# Patient Record
Sex: Female | Born: 1955 | Race: White | Hispanic: No | Marital: Single | Smoking: Never smoker
Health system: Southern US, Community
[De-identification: ages and names within clinical notes are randomized; demographics above are authoritative.]

## PROBLEM LIST (undated history)

## (undated) DIAGNOSIS — E559 Vitamin D deficiency, unspecified: Secondary | ICD-10-CM

## (undated) DIAGNOSIS — R51 Headache: Secondary | ICD-10-CM

## (undated) DIAGNOSIS — F71 Moderate intellectual disabilities: Secondary | ICD-10-CM

## (undated) DIAGNOSIS — F329 Major depressive disorder, single episode, unspecified: Secondary | ICD-10-CM

## (undated) DIAGNOSIS — S82209A Unspecified fracture of shaft of unspecified tibia, initial encounter for closed fracture: Secondary | ICD-10-CM

## (undated) DIAGNOSIS — I10 Essential (primary) hypertension: Secondary | ICD-10-CM

## (undated) DIAGNOSIS — K219 Gastro-esophageal reflux disease without esophagitis: Secondary | ICD-10-CM

## (undated) DIAGNOSIS — E669 Obesity, unspecified: Secondary | ICD-10-CM

## (undated) DIAGNOSIS — F32A Depression, unspecified: Secondary | ICD-10-CM

## (undated) DIAGNOSIS — A0472 Enterocolitis due to Clostridium difficile, not specified as recurrent: Secondary | ICD-10-CM

## (undated) DIAGNOSIS — N390 Urinary tract infection, site not specified: Secondary | ICD-10-CM

## (undated) DIAGNOSIS — R519 Headache, unspecified: Secondary | ICD-10-CM

## (undated) DIAGNOSIS — E785 Hyperlipidemia, unspecified: Secondary | ICD-10-CM

## (undated) DIAGNOSIS — F70 Mild intellectual disabilities: Secondary | ICD-10-CM

## (undated) DIAGNOSIS — F419 Anxiety disorder, unspecified: Secondary | ICD-10-CM

## (undated) DIAGNOSIS — C801 Malignant (primary) neoplasm, unspecified: Secondary | ICD-10-CM

## (undated) DIAGNOSIS — E119 Type 2 diabetes mellitus without complications: Secondary | ICD-10-CM

## (undated) HISTORY — PX: ABDOMINAL SURGERY: SHX537

## (undated) HISTORY — PX: COLON SURGERY: SHX602

## (undated) HISTORY — PX: DILATION AND CURETTAGE OF UTERUS: SHX78

## (undated) HISTORY — PX: FRACTURE SURGERY: SHX138

## (undated) HISTORY — PX: APPENDECTOMY: SHX54

## (undated) HISTORY — PX: BREAST SURGERY: SHX581

---

## 2011-12-24 DIAGNOSIS — E669 Obesity, unspecified: Secondary | ICD-10-CM | POA: Insufficient documentation

## 2012-09-10 DIAGNOSIS — S82202A Unspecified fracture of shaft of left tibia, initial encounter for closed fracture: Secondary | ICD-10-CM | POA: Insufficient documentation

## 2012-10-05 DIAGNOSIS — E1159 Type 2 diabetes mellitus with other circulatory complications: Secondary | ICD-10-CM | POA: Insufficient documentation

## 2012-10-05 DIAGNOSIS — F0390 Unspecified dementia without behavioral disturbance: Secondary | ICD-10-CM | POA: Insufficient documentation

## 2012-10-05 DIAGNOSIS — E785 Hyperlipidemia, unspecified: Secondary | ICD-10-CM | POA: Insufficient documentation

## 2012-10-05 DIAGNOSIS — E119 Type 2 diabetes mellitus without complications: Secondary | ICD-10-CM | POA: Insufficient documentation

## 2012-10-05 DIAGNOSIS — I152 Hypertension secondary to endocrine disorders: Secondary | ICD-10-CM | POA: Insufficient documentation

## 2012-10-05 DIAGNOSIS — N390 Urinary tract infection, site not specified: Secondary | ICD-10-CM | POA: Insufficient documentation

## 2012-10-05 DIAGNOSIS — E559 Vitamin D deficiency, unspecified: Secondary | ICD-10-CM | POA: Insufficient documentation

## 2012-10-05 DIAGNOSIS — E669 Obesity, unspecified: Secondary | ICD-10-CM | POA: Insufficient documentation

## 2012-10-05 DIAGNOSIS — S82109A Unspecified fracture of upper end of unspecified tibia, initial encounter for closed fracture: Secondary | ICD-10-CM | POA: Insufficient documentation

## 2012-10-05 DIAGNOSIS — R4189 Other symptoms and signs involving cognitive functions and awareness: Secondary | ICD-10-CM | POA: Insufficient documentation

## 2012-10-05 DIAGNOSIS — S72413A Displaced unspecified condyle fracture of lower end of unspecified femur, initial encounter for closed fracture: Secondary | ICD-10-CM | POA: Insufficient documentation

## 2012-10-26 DIAGNOSIS — K8020 Calculus of gallbladder without cholecystitis without obstruction: Secondary | ICD-10-CM | POA: Insufficient documentation

## 2012-10-30 DIAGNOSIS — IMO0001 Reserved for inherently not codable concepts without codable children: Secondary | ICD-10-CM | POA: Insufficient documentation

## 2013-04-18 DIAGNOSIS — C50919 Malignant neoplasm of unspecified site of unspecified female breast: Secondary | ICD-10-CM | POA: Insufficient documentation

## 2013-05-27 DIAGNOSIS — F411 Generalized anxiety disorder: Secondary | ICD-10-CM | POA: Diagnosis not present

## 2013-06-02 DIAGNOSIS — I498 Other specified cardiac arrhythmias: Secondary | ICD-10-CM | POA: Diagnosis not present

## 2013-06-02 DIAGNOSIS — Z79899 Other long term (current) drug therapy: Secondary | ICD-10-CM | POA: Diagnosis not present

## 2013-06-02 DIAGNOSIS — Z5181 Encounter for therapeutic drug level monitoring: Secondary | ICD-10-CM | POA: Diagnosis not present

## 2013-06-02 DIAGNOSIS — Z01818 Encounter for other preprocedural examination: Secondary | ICD-10-CM | POA: Diagnosis not present

## 2013-06-08 DIAGNOSIS — C50919 Malignant neoplasm of unspecified site of unspecified female breast: Secondary | ICD-10-CM | POA: Diagnosis not present

## 2013-06-09 DIAGNOSIS — C50919 Malignant neoplasm of unspecified site of unspecified female breast: Secondary | ICD-10-CM | POA: Diagnosis not present

## 2013-06-09 DIAGNOSIS — Z170 Estrogen receptor positive status [ER+]: Secondary | ICD-10-CM | POA: Diagnosis not present

## 2013-06-09 DIAGNOSIS — C50419 Malignant neoplasm of upper-outer quadrant of unspecified female breast: Secondary | ICD-10-CM | POA: Diagnosis not present

## 2013-06-09 DIAGNOSIS — L909 Atrophic disorder of skin, unspecified: Secondary | ICD-10-CM | POA: Diagnosis not present

## 2013-06-24 DIAGNOSIS — F411 Generalized anxiety disorder: Secondary | ICD-10-CM | POA: Diagnosis not present

## 2013-07-13 DIAGNOSIS — C50919 Malignant neoplasm of unspecified site of unspecified female breast: Secondary | ICD-10-CM | POA: Diagnosis not present

## 2013-07-16 DIAGNOSIS — F411 Generalized anxiety disorder: Secondary | ICD-10-CM | POA: Diagnosis not present

## 2013-07-19 DIAGNOSIS — C50919 Malignant neoplasm of unspecified site of unspecified female breast: Secondary | ICD-10-CM | POA: Diagnosis not present

## 2013-07-22 DIAGNOSIS — I10 Essential (primary) hypertension: Secondary | ICD-10-CM | POA: Diagnosis not present

## 2013-07-22 DIAGNOSIS — R0789 Other chest pain: Secondary | ICD-10-CM | POA: Diagnosis not present

## 2013-07-22 DIAGNOSIS — F411 Generalized anxiety disorder: Secondary | ICD-10-CM | POA: Diagnosis not present

## 2013-07-22 DIAGNOSIS — Z5181 Encounter for therapeutic drug level monitoring: Secondary | ICD-10-CM | POA: Diagnosis not present

## 2013-07-22 DIAGNOSIS — Z79899 Other long term (current) drug therapy: Secondary | ICD-10-CM | POA: Diagnosis not present

## 2013-07-22 DIAGNOSIS — E119 Type 2 diabetes mellitus without complications: Secondary | ICD-10-CM | POA: Diagnosis not present

## 2013-07-22 DIAGNOSIS — R079 Chest pain, unspecified: Secondary | ICD-10-CM | POA: Diagnosis not present

## 2013-07-22 DIAGNOSIS — C50919 Malignant neoplasm of unspecified site of unspecified female breast: Secondary | ICD-10-CM | POA: Diagnosis not present

## 2013-07-26 DIAGNOSIS — C50919 Malignant neoplasm of unspecified site of unspecified female breast: Secondary | ICD-10-CM | POA: Diagnosis not present

## 2013-07-30 DIAGNOSIS — C50919 Malignant neoplasm of unspecified site of unspecified female breast: Secondary | ICD-10-CM | POA: Diagnosis not present

## 2013-07-30 DIAGNOSIS — Z170 Estrogen receptor positive status [ER+]: Secondary | ICD-10-CM | POA: Diagnosis not present

## 2013-08-05 DIAGNOSIS — F411 Generalized anxiety disorder: Secondary | ICD-10-CM | POA: Diagnosis not present

## 2013-08-16 DIAGNOSIS — R51 Headache: Secondary | ICD-10-CM | POA: Diagnosis not present

## 2013-08-19 DIAGNOSIS — F411 Generalized anxiety disorder: Secondary | ICD-10-CM | POA: Diagnosis not present

## 2013-08-26 DIAGNOSIS — E119 Type 2 diabetes mellitus without complications: Secondary | ICD-10-CM | POA: Diagnosis not present

## 2013-09-01 DIAGNOSIS — R51 Headache: Secondary | ICD-10-CM | POA: Diagnosis not present

## 2013-09-01 DIAGNOSIS — IMO0001 Reserved for inherently not codable concepts without codable children: Secondary | ICD-10-CM | POA: Diagnosis not present

## 2013-09-01 DIAGNOSIS — E785 Hyperlipidemia, unspecified: Secondary | ICD-10-CM | POA: Diagnosis not present

## 2013-09-01 DIAGNOSIS — I10 Essential (primary) hypertension: Secondary | ICD-10-CM | POA: Diagnosis not present

## 2013-09-09 DIAGNOSIS — F411 Generalized anxiety disorder: Secondary | ICD-10-CM | POA: Diagnosis not present

## 2013-09-13 DIAGNOSIS — R51 Headache: Secondary | ICD-10-CM | POA: Diagnosis not present

## 2013-09-13 DIAGNOSIS — H5710 Ocular pain, unspecified eye: Secondary | ICD-10-CM | POA: Diagnosis not present

## 2013-09-16 DIAGNOSIS — R51 Headache: Secondary | ICD-10-CM | POA: Diagnosis not present

## 2013-09-16 DIAGNOSIS — H35039 Hypertensive retinopathy, unspecified eye: Secondary | ICD-10-CM | POA: Diagnosis not present

## 2013-09-16 DIAGNOSIS — E119 Type 2 diabetes mellitus without complications: Secondary | ICD-10-CM | POA: Diagnosis not present

## 2013-09-17 DIAGNOSIS — H00019 Hordeolum externum unspecified eye, unspecified eyelid: Secondary | ICD-10-CM | POA: Diagnosis not present

## 2013-09-21 DIAGNOSIS — R51 Headache: Secondary | ICD-10-CM | POA: Diagnosis not present

## 2013-09-21 DIAGNOSIS — M47812 Spondylosis without myelopathy or radiculopathy, cervical region: Secondary | ICD-10-CM | POA: Diagnosis not present

## 2013-09-27 DIAGNOSIS — F411 Generalized anxiety disorder: Secondary | ICD-10-CM | POA: Diagnosis not present

## 2013-10-11 DIAGNOSIS — R51 Headache: Secondary | ICD-10-CM | POA: Diagnosis not present

## 2013-10-11 DIAGNOSIS — M545 Low back pain, unspecified: Secondary | ICD-10-CM | POA: Diagnosis not present

## 2013-10-11 DIAGNOSIS — M999 Biomechanical lesion, unspecified: Secondary | ICD-10-CM | POA: Diagnosis not present

## 2013-10-11 DIAGNOSIS — M9981 Other biomechanical lesions of cervical region: Secondary | ICD-10-CM | POA: Diagnosis not present

## 2013-10-14 DIAGNOSIS — M999 Biomechanical lesion, unspecified: Secondary | ICD-10-CM | POA: Diagnosis not present

## 2013-10-14 DIAGNOSIS — M9981 Other biomechanical lesions of cervical region: Secondary | ICD-10-CM | POA: Diagnosis not present

## 2013-10-14 DIAGNOSIS — M545 Low back pain, unspecified: Secondary | ICD-10-CM | POA: Diagnosis not present

## 2013-10-14 DIAGNOSIS — R51 Headache: Secondary | ICD-10-CM | POA: Diagnosis not present

## 2013-10-18 DIAGNOSIS — M999 Biomechanical lesion, unspecified: Secondary | ICD-10-CM | POA: Diagnosis not present

## 2013-10-18 DIAGNOSIS — M9981 Other biomechanical lesions of cervical region: Secondary | ICD-10-CM | POA: Diagnosis not present

## 2013-10-18 DIAGNOSIS — F411 Generalized anxiety disorder: Secondary | ICD-10-CM | POA: Diagnosis not present

## 2013-10-18 DIAGNOSIS — R51 Headache: Secondary | ICD-10-CM | POA: Diagnosis not present

## 2013-10-18 DIAGNOSIS — M545 Low back pain, unspecified: Secondary | ICD-10-CM | POA: Diagnosis not present

## 2013-10-21 DIAGNOSIS — M545 Low back pain, unspecified: Secondary | ICD-10-CM | POA: Diagnosis not present

## 2013-10-21 DIAGNOSIS — M9981 Other biomechanical lesions of cervical region: Secondary | ICD-10-CM | POA: Diagnosis not present

## 2013-10-21 DIAGNOSIS — M999 Biomechanical lesion, unspecified: Secondary | ICD-10-CM | POA: Diagnosis not present

## 2013-10-21 DIAGNOSIS — R51 Headache: Secondary | ICD-10-CM | POA: Diagnosis not present

## 2013-10-26 DIAGNOSIS — C50919 Malignant neoplasm of unspecified site of unspecified female breast: Secondary | ICD-10-CM | POA: Diagnosis not present

## 2013-10-26 DIAGNOSIS — F3289 Other specified depressive episodes: Secondary | ICD-10-CM | POA: Diagnosis not present

## 2013-10-26 DIAGNOSIS — F329 Major depressive disorder, single episode, unspecified: Secondary | ICD-10-CM | POA: Diagnosis not present

## 2013-10-26 DIAGNOSIS — R443 Hallucinations, unspecified: Secondary | ICD-10-CM | POA: Diagnosis not present

## 2013-10-26 DIAGNOSIS — R51 Headache: Secondary | ICD-10-CM | POA: Diagnosis not present

## 2013-10-26 DIAGNOSIS — B369 Superficial mycosis, unspecified: Secondary | ICD-10-CM | POA: Diagnosis not present

## 2013-11-02 DIAGNOSIS — F411 Generalized anxiety disorder: Secondary | ICD-10-CM | POA: Diagnosis not present

## 2013-11-08 DIAGNOSIS — M999 Biomechanical lesion, unspecified: Secondary | ICD-10-CM | POA: Diagnosis not present

## 2013-11-08 DIAGNOSIS — R51 Headache: Secondary | ICD-10-CM | POA: Diagnosis not present

## 2013-11-08 DIAGNOSIS — M545 Low back pain, unspecified: Secondary | ICD-10-CM | POA: Diagnosis not present

## 2013-11-08 DIAGNOSIS — M9981 Other biomechanical lesions of cervical region: Secondary | ICD-10-CM | POA: Diagnosis not present

## 2013-11-09 DIAGNOSIS — H524 Presbyopia: Secondary | ICD-10-CM | POA: Diagnosis not present

## 2013-11-09 DIAGNOSIS — H5210 Myopia, unspecified eye: Secondary | ICD-10-CM | POA: Diagnosis not present

## 2013-11-09 DIAGNOSIS — H00019 Hordeolum externum unspecified eye, unspecified eyelid: Secondary | ICD-10-CM | POA: Diagnosis not present

## 2013-11-09 DIAGNOSIS — R51 Headache: Secondary | ICD-10-CM | POA: Diagnosis not present

## 2013-11-15 DIAGNOSIS — M545 Low back pain, unspecified: Secondary | ICD-10-CM | POA: Diagnosis not present

## 2013-11-15 DIAGNOSIS — M9981 Other biomechanical lesions of cervical region: Secondary | ICD-10-CM | POA: Diagnosis not present

## 2013-11-15 DIAGNOSIS — R51 Headache: Secondary | ICD-10-CM | POA: Diagnosis not present

## 2013-11-15 DIAGNOSIS — M999 Biomechanical lesion, unspecified: Secondary | ICD-10-CM | POA: Diagnosis not present

## 2013-11-22 DIAGNOSIS — C50919 Malignant neoplasm of unspecified site of unspecified female breast: Secondary | ICD-10-CM | POA: Diagnosis not present

## 2013-11-25 DIAGNOSIS — F411 Generalized anxiety disorder: Secondary | ICD-10-CM | POA: Diagnosis not present

## 2013-11-29 DIAGNOSIS — IMO0001 Reserved for inherently not codable concepts without codable children: Secondary | ICD-10-CM | POA: Diagnosis not present

## 2013-11-30 DIAGNOSIS — R519 Headache, unspecified: Secondary | ICD-10-CM | POA: Insufficient documentation

## 2013-11-30 DIAGNOSIS — R51 Headache: Secondary | ICD-10-CM

## 2013-11-30 DIAGNOSIS — F323 Major depressive disorder, single episode, severe with psychotic features: Secondary | ICD-10-CM | POA: Diagnosis not present

## 2013-11-30 DIAGNOSIS — Z79899 Other long term (current) drug therapy: Secondary | ICD-10-CM | POA: Diagnosis not present

## 2013-12-02 DIAGNOSIS — Z79899 Other long term (current) drug therapy: Secondary | ICD-10-CM | POA: Diagnosis not present

## 2013-12-02 DIAGNOSIS — IMO0001 Reserved for inherently not codable concepts without codable children: Secondary | ICD-10-CM | POA: Diagnosis not present

## 2013-12-02 DIAGNOSIS — G44209 Tension-type headache, unspecified, not intractable: Secondary | ICD-10-CM | POA: Diagnosis not present

## 2013-12-02 DIAGNOSIS — I10 Essential (primary) hypertension: Secondary | ICD-10-CM | POA: Diagnosis not present

## 2013-12-07 DIAGNOSIS — F71 Moderate intellectual disabilities: Secondary | ICD-10-CM | POA: Diagnosis not present

## 2013-12-09 DIAGNOSIS — F71 Moderate intellectual disabilities: Secondary | ICD-10-CM | POA: Diagnosis not present

## 2013-12-21 DIAGNOSIS — Z79899 Other long term (current) drug therapy: Secondary | ICD-10-CM | POA: Diagnosis not present

## 2013-12-21 DIAGNOSIS — R51 Headache: Secondary | ICD-10-CM | POA: Diagnosis not present

## 2013-12-21 DIAGNOSIS — I10 Essential (primary) hypertension: Secondary | ICD-10-CM | POA: Diagnosis not present

## 2013-12-21 DIAGNOSIS — F71 Moderate intellectual disabilities: Secondary | ICD-10-CM | POA: Diagnosis not present

## 2013-12-21 DIAGNOSIS — IMO0002 Reserved for concepts with insufficient information to code with codable children: Secondary | ICD-10-CM | POA: Diagnosis not present

## 2013-12-21 DIAGNOSIS — Z5181 Encounter for therapeutic drug level monitoring: Secondary | ICD-10-CM | POA: Diagnosis not present

## 2013-12-21 DIAGNOSIS — E1169 Type 2 diabetes mellitus with other specified complication: Secondary | ICD-10-CM | POA: Diagnosis not present

## 2013-12-22 DIAGNOSIS — E1169 Type 2 diabetes mellitus with other specified complication: Secondary | ICD-10-CM | POA: Diagnosis not present

## 2013-12-22 DIAGNOSIS — F71 Moderate intellectual disabilities: Secondary | ICD-10-CM | POA: Diagnosis not present

## 2014-02-07 DIAGNOSIS — F411 Generalized anxiety disorder: Secondary | ICD-10-CM | POA: Diagnosis not present

## 2014-02-14 DIAGNOSIS — Z79899 Other long term (current) drug therapy: Secondary | ICD-10-CM | POA: Diagnosis not present

## 2014-02-14 DIAGNOSIS — E1165 Type 2 diabetes mellitus with hyperglycemia: Secondary | ICD-10-CM | POA: Diagnosis not present

## 2014-02-15 DIAGNOSIS — E559 Vitamin D deficiency, unspecified: Secondary | ICD-10-CM | POA: Diagnosis not present

## 2014-02-25 DIAGNOSIS — F79 Unspecified intellectual disabilities: Secondary | ICD-10-CM | POA: Diagnosis not present

## 2014-02-25 DIAGNOSIS — M940 Chondrocostal junction syndrome [Tietze]: Secondary | ICD-10-CM | POA: Diagnosis not present

## 2014-02-25 DIAGNOSIS — R52 Pain, unspecified: Secondary | ICD-10-CM | POA: Diagnosis not present

## 2014-02-25 DIAGNOSIS — I10 Essential (primary) hypertension: Secondary | ICD-10-CM | POA: Diagnosis not present

## 2014-02-25 DIAGNOSIS — E119 Type 2 diabetes mellitus without complications: Secondary | ICD-10-CM | POA: Diagnosis not present

## 2014-02-25 DIAGNOSIS — F22 Delusional disorders: Secondary | ICD-10-CM | POA: Diagnosis not present

## 2014-02-26 DIAGNOSIS — E119 Type 2 diabetes mellitus without complications: Secondary | ICD-10-CM | POA: Diagnosis not present

## 2014-02-26 DIAGNOSIS — F22 Delusional disorders: Secondary | ICD-10-CM | POA: Diagnosis not present

## 2014-02-26 DIAGNOSIS — M940 Chondrocostal junction syndrome [Tietze]: Secondary | ICD-10-CM | POA: Diagnosis not present

## 2014-02-26 DIAGNOSIS — F79 Unspecified intellectual disabilities: Secondary | ICD-10-CM | POA: Diagnosis not present

## 2014-02-26 DIAGNOSIS — I10 Essential (primary) hypertension: Secondary | ICD-10-CM | POA: Diagnosis not present

## 2014-02-26 DIAGNOSIS — R52 Pain, unspecified: Secondary | ICD-10-CM | POA: Diagnosis not present

## 2014-02-28 DIAGNOSIS — R4189 Other symptoms and signs involving cognitive functions and awareness: Secondary | ICD-10-CM | POA: Diagnosis not present

## 2014-02-28 DIAGNOSIS — F329 Major depressive disorder, single episode, unspecified: Secondary | ICD-10-CM | POA: Diagnosis not present

## 2014-02-28 DIAGNOSIS — F411 Generalized anxiety disorder: Secondary | ICD-10-CM | POA: Diagnosis not present

## 2014-02-28 DIAGNOSIS — F29 Unspecified psychosis not due to a substance or known physiological condition: Secondary | ICD-10-CM | POA: Diagnosis not present

## 2014-04-04 DIAGNOSIS — R4189 Other symptoms and signs involving cognitive functions and awareness: Secondary | ICD-10-CM | POA: Diagnosis not present

## 2014-04-04 DIAGNOSIS — F329 Major depressive disorder, single episode, unspecified: Secondary | ICD-10-CM | POA: Diagnosis not present

## 2014-04-04 DIAGNOSIS — F411 Generalized anxiety disorder: Secondary | ICD-10-CM | POA: Diagnosis not present

## 2014-04-04 DIAGNOSIS — F29 Unspecified psychosis not due to a substance or known physiological condition: Secondary | ICD-10-CM | POA: Diagnosis not present

## 2014-05-09 DIAGNOSIS — F411 Generalized anxiety disorder: Secondary | ICD-10-CM | POA: Diagnosis not present

## 2014-05-09 DIAGNOSIS — R4189 Other symptoms and signs involving cognitive functions and awareness: Secondary | ICD-10-CM | POA: Diagnosis not present

## 2014-05-09 DIAGNOSIS — F329 Major depressive disorder, single episode, unspecified: Secondary | ICD-10-CM | POA: Diagnosis not present

## 2014-05-09 DIAGNOSIS — F29 Unspecified psychosis not due to a substance or known physiological condition: Secondary | ICD-10-CM | POA: Diagnosis not present

## 2014-06-08 DIAGNOSIS — S42402A Unspecified fracture of lower end of left humerus, initial encounter for closed fracture: Secondary | ICD-10-CM | POA: Insufficient documentation

## 2014-06-15 DIAGNOSIS — E119 Type 2 diabetes mellitus without complications: Secondary | ICD-10-CM | POA: Diagnosis not present

## 2014-06-15 DIAGNOSIS — H2513 Age-related nuclear cataract, bilateral: Secondary | ICD-10-CM | POA: Diagnosis not present

## 2014-09-22 DIAGNOSIS — D509 Iron deficiency anemia, unspecified: Secondary | ICD-10-CM | POA: Insufficient documentation

## 2014-10-03 DIAGNOSIS — R4189 Other symptoms and signs involving cognitive functions and awareness: Secondary | ICD-10-CM | POA: Diagnosis not present

## 2014-10-03 DIAGNOSIS — F411 Generalized anxiety disorder: Secondary | ICD-10-CM | POA: Diagnosis not present

## 2014-10-03 DIAGNOSIS — F323 Major depressive disorder, single episode, severe with psychotic features: Secondary | ICD-10-CM | POA: Diagnosis not present

## 2014-10-03 DIAGNOSIS — F39 Unspecified mood [affective] disorder: Secondary | ICD-10-CM | POA: Diagnosis not present

## 2014-10-25 ENCOUNTER — Encounter: Payer: Self-pay | Admitting: Anesthesiology

## 2014-10-25 ENCOUNTER — Encounter: Payer: Self-pay | Admitting: *Deleted

## 2014-10-25 ENCOUNTER — Ambulatory Visit
Admission: RE | Admit: 2014-10-25 | Discharge: 2014-10-25 | Disposition: A | Discharge status: Home / Self Care | Payer: Medicare Other | Source: Ambulatory Visit | Attending: Gastroenterology | Admitting: Gastroenterology

## 2014-10-25 DIAGNOSIS — Z538 Procedure and treatment not carried out for other reasons: Secondary | ICD-10-CM | POA: Diagnosis not present

## 2014-10-25 DIAGNOSIS — D509 Iron deficiency anemia, unspecified: Secondary | ICD-10-CM | POA: Diagnosis present

## 2014-10-25 HISTORY — DX: Hyperlipidemia, unspecified: E78.5

## 2014-10-25 HISTORY — DX: Unspecified fracture of shaft of unspecified tibia, initial encounter for closed fracture: S82.209A

## 2014-10-25 HISTORY — DX: Obesity, unspecified: E66.9

## 2014-10-25 HISTORY — DX: Malignant (primary) neoplasm, unspecified: C80.1

## 2014-10-25 HISTORY — DX: Anxiety disorder, unspecified: F41.9

## 2014-10-25 HISTORY — DX: Type 2 diabetes mellitus without complications: E11.9

## 2014-10-25 HISTORY — DX: Essential (primary) hypertension: I10

## 2014-10-25 HISTORY — DX: Vitamin D deficiency, unspecified: E55.9

## 2014-10-25 HISTORY — DX: Urinary tract infection, site not specified: N39.0

## 2014-10-25 HISTORY — DX: Gastro-esophageal reflux disease without esophagitis: K21.9

## 2014-10-25 HISTORY — DX: Mild intellectual disabilities: F70

## 2014-10-25 HISTORY — DX: Enterocolitis due to Clostridium difficile, not specified as recurrent: A04.72

## 2014-10-25 HISTORY — DX: Major depressive disorder, single episode, unspecified: F32.9

## 2014-10-25 HISTORY — DX: Moderate intellectual disabilities: F71

## 2014-10-25 HISTORY — DX: Depression, unspecified: F32.A

## 2014-10-25 HISTORY — DX: Headache, unspecified: R51.9

## 2014-10-25 HISTORY — DX: Headache: R51

## 2014-10-25 LAB — GLUCOSE, CAPILLARY: Glucose-Capillary: 184 mg/dL — ABNORMAL HIGH (ref 65–99)

## 2014-10-26 ENCOUNTER — Ambulatory Visit: Payer: Medicare Other | Admitting: Anesthesiology

## 2014-10-26 ENCOUNTER — Ambulatory Visit
Admission: RE | Admit: 2014-10-26 | Discharge: 2014-10-26 | Disposition: A | Discharge status: Skilled Nursing Facility | Payer: Medicare Other | Source: Ambulatory Visit | Attending: Gastroenterology | Admitting: Gastroenterology

## 2014-10-26 ENCOUNTER — Encounter
Admission: RE | Disposition: A | Discharge status: Skilled Nursing Facility | Payer: Self-pay | Source: Ambulatory Visit | Attending: Gastroenterology

## 2014-10-26 ENCOUNTER — Encounter
Admission: RE | Disposition: A | Discharge status: Home / Self Care | Payer: Self-pay | Source: Ambulatory Visit | Attending: Gastroenterology

## 2014-10-26 DIAGNOSIS — D120 Benign neoplasm of cecum: Secondary | ICD-10-CM | POA: Diagnosis not present

## 2014-10-26 DIAGNOSIS — Z79899 Other long term (current) drug therapy: Secondary | ICD-10-CM | POA: Insufficient documentation

## 2014-10-26 DIAGNOSIS — I10 Essential (primary) hypertension: Secondary | ICD-10-CM | POA: Insufficient documentation

## 2014-10-26 DIAGNOSIS — D125 Benign neoplasm of sigmoid colon: Secondary | ICD-10-CM | POA: Insufficient documentation

## 2014-10-26 DIAGNOSIS — K219 Gastro-esophageal reflux disease without esophagitis: Secondary | ICD-10-CM | POA: Insufficient documentation

## 2014-10-26 DIAGNOSIS — Z980 Intestinal bypass and anastomosis status: Secondary | ICD-10-CM | POA: Insufficient documentation

## 2014-10-26 DIAGNOSIS — E559 Vitamin D deficiency, unspecified: Secondary | ICD-10-CM | POA: Insufficient documentation

## 2014-10-26 DIAGNOSIS — E785 Hyperlipidemia, unspecified: Secondary | ICD-10-CM | POA: Diagnosis not present

## 2014-10-26 DIAGNOSIS — D509 Iron deficiency anemia, unspecified: Secondary | ICD-10-CM | POA: Insufficient documentation

## 2014-10-26 DIAGNOSIS — F329 Major depressive disorder, single episode, unspecified: Secondary | ICD-10-CM | POA: Insufficient documentation

## 2014-10-26 DIAGNOSIS — D124 Benign neoplasm of descending colon: Secondary | ICD-10-CM | POA: Insufficient documentation

## 2014-10-26 DIAGNOSIS — K635 Polyp of colon: Secondary | ICD-10-CM | POA: Diagnosis not present

## 2014-10-26 DIAGNOSIS — K2970 Gastritis, unspecified, without bleeding: Secondary | ICD-10-CM | POA: Insufficient documentation

## 2014-10-26 DIAGNOSIS — F419 Anxiety disorder, unspecified: Secondary | ICD-10-CM | POA: Diagnosis not present

## 2014-10-26 DIAGNOSIS — K319 Disease of stomach and duodenum, unspecified: Secondary | ICD-10-CM | POA: Insufficient documentation

## 2014-10-26 DIAGNOSIS — E119 Type 2 diabetes mellitus without complications: Secondary | ICD-10-CM | POA: Diagnosis not present

## 2014-10-26 DIAGNOSIS — Z79891 Long term (current) use of opiate analgesic: Secondary | ICD-10-CM | POA: Insufficient documentation

## 2014-10-26 DIAGNOSIS — F70 Mild intellectual disabilities: Secondary | ICD-10-CM | POA: Diagnosis not present

## 2014-10-26 DIAGNOSIS — K2930 Chronic superficial gastritis without bleeding: Secondary | ICD-10-CM | POA: Diagnosis not present

## 2014-10-26 DIAGNOSIS — E669 Obesity, unspecified: Secondary | ICD-10-CM | POA: Insufficient documentation

## 2014-10-26 HISTORY — PX: ESOPHAGOGASTRODUODENOSCOPY (EGD) WITH PROPOFOL: SHX5813

## 2014-10-26 HISTORY — PX: COLONOSCOPY: SHX5424

## 2014-10-26 LAB — GLUCOSE, CAPILLARY: Glucose-Capillary: 147 mg/dL — ABNORMAL HIGH (ref 65–99)

## 2014-10-26 SURGERY — COLONOSCOPY
Anesthesia: General

## 2014-10-26 SURGERY — COLONOSCOPY WITH PROPOFOL
Anesthesia: General

## 2014-10-26 MED ORDER — SODIUM CHLORIDE 0.9 % IV SOLN: INTRAVENOUS | Status: DC

## 2014-10-26 MED ORDER — GLYCOPYRROLATE 0.2 MG/ML IJ SOLN
INTRAMUSCULAR | Status: DC | PRN
  Administered 2014-10-26: 0.2 mg via INTRAVENOUS

## 2014-10-26 MED ORDER — PROPOFOL INFUSION 10 MG/ML OPTIME
INTRAVENOUS | Status: DC | PRN
  Administered 2014-10-26: 50 ug/kg/min via INTRAVENOUS

## 2014-10-26 MED ORDER — FENTANYL CITRATE (PF) 100 MCG/2ML IJ SOLN
INTRAMUSCULAR | Status: DC | PRN
  Administered 2014-10-26: 50 ug via INTRAVENOUS

## 2014-10-26 MED ORDER — PROPOFOL 10 MG/ML IV BOLUS
INTRAVENOUS | Status: DC | PRN
  Administered 2014-10-26: 30 mg via INTRAVENOUS

## 2014-10-26 MED ORDER — LIDOCAINE HCL (PF) 2 % IJ SOLN
INTRAMUSCULAR | Status: DC | PRN
  Administered 2014-10-26: 40 mg

## 2014-10-26 MED ORDER — SODIUM CHLORIDE 0.9 % IV SOLN
INTRAVENOUS | Status: DC
  Administered 2014-10-26: 1000 mL via INTRAVENOUS

## 2014-10-26 MED ORDER — MIDAZOLAM HCL 5 MG/5ML IJ SOLN
INTRAMUSCULAR | Status: DC | PRN
  Administered 2014-10-26: 2 mg via INTRAVENOUS
  Administered 2014-10-26: 1 mg via INTRAVENOUS
  Administered 2014-10-26: 2 mg via INTRAVENOUS

## 2014-10-26 MED ORDER — PHENYLEPHRINE HCL 10 MG/ML IJ SOLN
INTRAMUSCULAR | Status: DC | PRN
  Administered 2014-10-26 (×2): 100 ug via INTRAVENOUS

## 2014-10-26 NOTE — H&P (Signed)
Outpatient short stay form Pre-procedure 10/26/2014 2:02 PM Carly Sails MD  Primary Physician: Dr. Alphonzo Severance  Reason for visit:  Colonoscopy and EGD  History of present illness:  Patient is a 59 year old Caucasian female who is presenting today for evaluation of iron deficiency anemia. 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 I believe her last colonoscopy was prior to the segmental right colectomy I have been unable to find that procedure.  Patient initially presented yesterday had a poor prep and was reprepped overnight procedure today. sHe tolerated the reprepped. History is been obtained from her mother who copies her. 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    Current facility-administered medications:  .  0.9 %  sodium chloride infusion, , Intravenous, Continuous, Carly Sails, MD, Last Rate: 50 mL/hr at 10/26/14 1323, 1,000 mL at 10/26/14 1323 .  0.9 %  sodium chloride infusion, , Intravenous, Continuous, Carly Sails, MD  Facility-Administered Medications Ordered in Other Encounters:  .  fentaNYL (SUBLIMAZE) injection, , , Anesthesia Intra-op, Letitia Neri, CRNA, 50 mcg at 10/26/14 1401 .  midazolam (VERSED) 5 MG/5ML injection, , Intravenous, Anesthesia Intra-op, Letitia Neri, CRNA, 1 mg at 10/26/14 1353  Prescriptions prior to admission  Medication Sig Dispense Refill Last Dose  . Infant Care Products (DERMACLOUD) CREA Apply topically once.   10/25/2014 at Unknown time  . ketoconazole (NIZORAL) 2 % cream Apply 1 application topically daily.   10/25/2014 at Unknown time  . letrozole (FEMARA) 2.5 MG tablet Take 2.5 mg by mouth  daily.   10/25/2014 at Unknown time  . lisinopril (PRINIVIL,ZESTRIL) 20 MG tablet Take 20 mg by mouth daily.   10/25/2014 at Unknown time  . nystatin (MYCOSTATIN/NYSTOP) 100000 UNIT/GM POWD Apply topically 2 (two) times daily.   10/25/2014 at Unknown time  . omeprazole (PRILOSEC) 20 MG capsule Take 20 mg by mouth daily.   10/25/2014 at Unknown time  . oxycodone (OXY-IR) 5 MG capsule Take 5 mg by mouth every 4 (four) hours as needed.   10/25/2014 at Unknown time  . PARoxetine (PAXIL) 20 MG tablet Take 20 mg by mouth daily.   10/25/2014 at Unknown time  . pravastatin (PRAVACHOL) 40 MG tablet Take 40 mg by mouth daily.   10/25/2014 at Unknown time  . traZODone (DESYREL) 50 MG tablet Take 50 mg by mouth at bedtime.   10/25/2014 at Unknown time  . amLODipine (NORVASC) 5 MG tablet Take 5 mg by mouth daily.     . Cholecalciferol (VITAMIN D-3) 1000 UNITS CAPS Take 1,000 Units by mouth once.     . Cholecalciferol (VITAMIN D3) 2000 UNITS TABS Take 2,000 Units by mouth once.     . clonazePAM (KLONOPIN) 0.5 MG tablet Take 0.5 mg by mouth at bedtime.     . divalproex (DEPAKOTE) 250 MG DR tablet Take 250 mg by mouth 2 (two) times daily.     Marland Kitchen docusate sodium (COLACE) 100 MG capsule Take 100 mg by mouth 2 (two) times daily.     . ferrous gluconate (FERGON) 240 (27 FE) MG tablet Take 240 mg by mouth 3 (three) times daily with meals.     . folic acid (FOLVITE) 528 MCG tablet Take 400  mcg by mouth daily.     . QUEtiapine (SEROQUEL) 100 MG tablet Take 100 mg by mouth at bedtime.     . sitaGLIPtin (JANUVIA) 100 MG tablet Take 100 mg by mouth daily.        Allergies  Allergen Reactions  . Actos [Pioglitazone]      Past Medical History  Diagnosis Date  . Diabetes mellitus without complication   . Cancer   . Hypertension   . Obesity   . Hyperlipidemia   . Vitamin D deficiency   . Moderate intellectual disabilities   . Anxiety   . Depression   . Recurrent UTI   . Tibial fracture   . GERD (gastroesophageal  reflux disease)   . Headache   . C. difficile enteritis   . Mental retardation, mild (I.Q. 50-70)     Review of systems:      Physical Exam    Heart and lungs: Regular rate and rhythm without rub or gallop lungs are bilaterally clear    HEENT: Norm cephalic atraumatic eyes are anicteric    Other:     Pertinant exam for procedure: Obese nontender nondistended bowel sounds positive normoactive    Planned proceedures: EGD and colonoscopy with indicated procedures I have discussed the risks benefits and complications of procedures to include not limited to bleeding, infection, perforation and the risk of sedation and the patient wishes to proceed.    Carly Sails, MD Gastroenterology 10/26/2014  2:02 PM

## 2014-10-26 NOTE — Anesthesia Preprocedure Evaluation (Signed)
Anesthesia Evaluation  Patient identified by MRN, date of birth, ID band Patient awake    Reviewed: Allergy & Precautions, NPO status , Patient's Chart, lab work & pertinent test results  Airway Mallampati: III  TM Distance: >3 FB Neck ROM: Full    Dental  (+) Teeth Intact   Pulmonary  breath sounds clear to auscultation        Cardiovascular hypertension, Pt. on medications Normal cardiovascular examRhythm:Regular Rate:Normal     Neuro/Psych Mentally challenged but cooperative. Here yesterday with an incomplete prep.    GI/Hepatic   Endo/Other  diabetes, Type 2BG 147 this afternoon.  Renal/GU      Musculoskeletal   Abdominal (+) + obese,  Abdomen: soft.    Peds  Hematology   Anesthesia Other Findings   Reproductive/Obstetrics                             Anesthesia Physical Anesthesia Plan  ASA: III  Anesthesia Plan: General   Post-op Pain Management:    Induction: Intravenous  Airway Management Planned: Simple Face Mask and Nasal Cannula  Additional Equipment:   Intra-op Plan:   Post-operative Plan:   Informed Consent: I have reviewed the patients History and Physical, chart, labs and discussed the procedure including the risks, benefits and alternatives for the proposed anesthesia with the patient or authorized representative who has indicated his/her understanding and acceptance.     Plan Discussed with: CRNA  Anesthesia Plan Comments:         Anesthesia Quick Evaluation

## 2014-10-26 NOTE — Op Note (Signed)
[facility] Gastroenterology Patient Name: Carly Collins Procedure Date: 10/26/2014 2:02 PM MRN: [id number] Account #: [id number] Date of Birth: Feb 11, 1956 Admit Type: Outpatient Age: 59 Room: [facility] ENDO ROOM 4 Gender: Female Note Status: Finalized Procedure:         Colonoscopy Indications:       Iron deficiency anemia Providers:         Lollie Sails, MD Medicines:         Monitored Anesthesia Care Complications:     No immediate complications. Procedure:         Pre-Anesthesia Assessment:                    - ASA Grade Assessment: III - A patient with severe                     systemic disease.                    After obtaining informed consent, the colonoscope was                     passed under direct vision. Throughout the procedure, the                     patient's blood pressure, pulse, and oxygen saturations                     were monitored continuously. The Colonoscope was                     introduced through the anus and advanced to the the cecum,                     identified by appendiceal orifice and ileocecal valve. The                     colonoscopy was performed with moderate difficulty due to                     poor bowel prep and a tortuous colon. Successful                     completion of the procedure was aided by using manual                     pressure and lavage. The patient tolerated the procedure                     well. The quality of the bowel preparation was fair. Findings:      A 2 mm polyp was found in the transverse colon. The polyp was sessile.       The polyp was removed with a cold biopsy forceps. Resection and       retrieval were complete.      Three sessile polyps were found in the cecum. The polyps were less than       1 mm in size. These polyps were removed with a cold biopsy forceps.       Resection and retrieval were complete.      Three sessile polyps were found in the descending colon. The polyps were        1 to 2 mm in size. These polyps were removed with a cold biopsy forceps.       Resection  and retrieval were complete.      A 5 mm polyp was found in the sigmoid colon in the proximal sigmoid       colon. The polyp was sessile. The polyp was removed with a cold snare.       Resection and retrieval were complete.      Two sessile polyps were found in the proximal sigmoid colon. The polyps       were 1 to 2 mm in size. These polyps were removed with a cold biopsy       forceps. Resection and retrieval were complete.      A 5 mm polyp was found at 25 cm proximal to the anus. The polyp was       sessile. The polyp was removed with a cold snare. Resection and       retrieval were complete.      There was evidence of a prior end-to-end colo-rectal anastomosis in the       proximal ascending colon. This was characterized by healthy appearing       mucosa and visible sutures.      There was evidence of a prior end-to-end colo-colonic anastomosis at 15       cm proximal to the anus. This was patent. This was characterized by       healthy appearing mucosa and visible sutures. Impression:        - One 2 mm polyp in the transverse colon. Resected and                     retrieved.                    - Three less than 1 mm polyps in the cecum. Resected and                     retrieved.                    - Three 1 to 2 mm polyps in the descending colon. Resected                     and retrieved.                    - One 5 mm polyp in the sigmoid colon in the proximal                     sigmoid colon. Resected and retrieved.                    - Two 1 to 2 mm polyps in the proximal sigmoid colon.                     Resected and retrieved.                    - One 5 mm polyp at 25 cm proximal to the anus. Resected                     and retrieved.                    - End-to-end colo-rectal anastomosis, characterized by                     healthy appearing mucosa and visible sutures.                     -  Patent end-to-end colo-colonic anastomosis,                     characterized by healthy appearing mucosa and visible                     sutures. Recommendation:    - Await pathology results. Procedure Code(s): --- Professional ---                    [phone], Colonoscopy, flexible; with removal of tumor(s),                     polyp(s), or other lesion(s) by snare technique                    45380, 45, Colonoscopy, flexible; with biopsy, single or                     multiple Diagnosis Code(s): --- Professional ---                    211.3, Benign neoplasm of colon                    280.9, Iron deficiency anemia, unspecified                    V45.3, Intestinal bypass or anastomosis status CPT copyright 2014 American Medical Association. All rights reserved. The codes documented in this report are preliminary and upon coder review may  be revised to meet current compliance requirements. Lollie Sails, MD 10/26/2014 3:35:42 PM This report has been signed electronically. Number of Addenda: 0 Note Initiated On: 10/26/2014 2:02 PM Scope Withdrawal Time: 0 hours 19 minutes 19 seconds  Total Procedure Duration: 0 hours 47 minutes 4 seconds       [facility]

## 2014-10-26 NOTE — Transfer of Care (Signed)
Immediate Anesthesia Transfer of Care Note  Patient: Carly Collins  Procedure(s) Performed: Procedure(s): COLONOSCOPY (N/A) ESOPHAGOGASTRODUODENOSCOPY (EGD) WITH PROPOFOL (N/A)  Patient Location: PACU  Anesthesia Type:general   Level of Consciousness: awake and sedated  Airway & Oxygen Therapy: Patient Spontanous Breathing and Patient connected to nasal cannula oxygen  Post-op Assessment: Report given to RN and Post -op Vital signs reviewed and stable  Post vital signs: Reviewed and stable  Last Vitals:  139/62 82 15 829 % 93.7  Complications: No apparent anesthesia complications

## 2014-10-26 NOTE — Anesthesia Postprocedure Evaluation (Signed)
  Anesthesia Post-op Note  Patient: Carly Collins  Procedure(s) Performed: Procedure(s): COLONOSCOPY (N/A) ESOPHAGOGASTRODUODENOSCOPY (EGD) WITH PROPOFOL (N/A)  Anesthesia type:General  Patient location: PACU  Post pain: Pain level controlled  Post assessment: Post-op Vital signs reviewed, Patient's Cardiovascular Status Stable, Respiratory Function Stable, Patent Airway and No signs of Nausea or vomiting  Post vital signs: Reviewed and stable  Last Vitals:  Filed Vitals:   10/26/14 1600  BP: 135/74  Pulse: 82  Temp:   Resp: 16    Level of consciousness: awake, alert  and patient cooperative  Complications: No apparent anesthesia complications

## 2014-10-26 NOTE — Op Note (Signed)
[facility] Gastroenterology Patient Name: Carly Collins Procedure Date: 10/26/2014 2:01 PM MRN: [id number] Account #: [id number] Date of Birth: June 08, 1955 Admit Type: Outpatient Age: 60 Room: [facility] ENDO ROOM 4 Gender: Female Note Status: Finalized Procedure:         Upper GI endoscopy Indications:       Iron deficiency anemia Providers:         Lollie Sails, MD Medicines:         Monitored Anesthesia Care Complications:     No immediate complications. Procedure:         Pre-Anesthesia Assessment:                    - ASA Grade Assessment: III - A patient with severe                     systemic disease.                    After obtaining informed consent, the endoscope was passed                     under direct vision. Throughout the procedure, the                     patient's blood pressure, pulse, and oxygen saturations                     were monitored continuously. The Olympus GIF-160 endoscope                     (S#. [id number]) was introduced through the mouth, and                     advanced to the third part of duodenum. The upper GI                     endoscopy was accomplished without difficulty. The patient                     tolerated the procedure well. Findings:      The examined esophagus was normal.      Diffuse minimal inflammation characterized by erythema was found in the       gastric body. Biopsies were taken with a cold forceps for histology.      Multiple dispersed, small non-bleeding erosions were found in the       gastric antrum. There were no stigmata of recent bleeding. Biopsies were       taken with a cold forceps for histology.      The cardia and gastric fundus were normal on retroflexion.      The examined duodenum was normal. Impression:        - Normal esophagus.                    - Gastritis. Biopsied.                    - Non-bleeding erosive gastropathy. Biopsied.                    - Normal examined  duodenum. Recommendation:    - Perform a colonoscopy today. Procedure Code(s): --- Professional ---                    [phone], Esophagogastroduodenoscopy, flexible,  transoral;                     with biopsy, single or multiple Diagnosis Code(s): --- Professional ---                    535.50, Unspecified gastritis and gastroduodenitis,                     without mention of hemorrhage                    537.9, Unspecified disorder of stomach and duodenum                    280.9, Iron deficiency anemia, unspecified CPT copyright 2014 American Medical Association. All rights reserved. The codes documented in this report are preliminary and upon coder review may  be revised to meet current compliance requirements. Lollie Sails, MD 10/26/2014 2:34:18 PM This report has been signed electronically. Number of Addenda: 0 Note Initiated On: 10/26/2014 2:01 PM      [facility]

## 2014-10-28 LAB — SURGICAL PATHOLOGY

## 2014-11-07 ENCOUNTER — Encounter: Payer: Self-pay | Admitting: Gastroenterology

## 2014-11-21 DIAGNOSIS — F39 Unspecified mood [affective] disorder: Secondary | ICD-10-CM | POA: Diagnosis not present

## 2014-12-01 ENCOUNTER — Other Ambulatory Visit: Payer: Self-pay | Admitting: Gastroenterology

## 2014-12-01 DIAGNOSIS — D509 Iron deficiency anemia, unspecified: Secondary | ICD-10-CM

## 2014-12-01 DIAGNOSIS — R197 Diarrhea, unspecified: Secondary | ICD-10-CM

## 2014-12-01 DIAGNOSIS — Z86010 Personal history of colonic polyps: Secondary | ICD-10-CM

## 2014-12-07 DIAGNOSIS — F39 Unspecified mood [affective] disorder: Secondary | ICD-10-CM | POA: Diagnosis not present

## 2014-12-12 DIAGNOSIS — F323 Major depressive disorder, single episode, severe with psychotic features: Secondary | ICD-10-CM | POA: Diagnosis not present

## 2014-12-12 DIAGNOSIS — F411 Generalized anxiety disorder: Secondary | ICD-10-CM | POA: Diagnosis not present

## 2014-12-12 DIAGNOSIS — F39 Unspecified mood [affective] disorder: Secondary | ICD-10-CM | POA: Diagnosis not present

## 2014-12-12 DIAGNOSIS — R4189 Other symptoms and signs involving cognitive functions and awareness: Secondary | ICD-10-CM | POA: Diagnosis not present

## 2014-12-28 DIAGNOSIS — F39 Unspecified mood [affective] disorder: Secondary | ICD-10-CM | POA: Diagnosis not present

## 2014-12-29 ENCOUNTER — Ambulatory Visit: Payer: Medicare Other

## 2015-01-23 ENCOUNTER — Ambulatory Visit: Payer: Medicare Other

## 2015-02-22 DIAGNOSIS — F39 Unspecified mood [affective] disorder: Secondary | ICD-10-CM | POA: Diagnosis not present

## 2015-03-06 DIAGNOSIS — R4189 Other symptoms and signs involving cognitive functions and awareness: Secondary | ICD-10-CM | POA: Diagnosis not present

## 2015-03-06 DIAGNOSIS — F323 Major depressive disorder, single episode, severe with psychotic features: Secondary | ICD-10-CM | POA: Diagnosis not present

## 2015-03-06 DIAGNOSIS — F39 Unspecified mood [affective] disorder: Secondary | ICD-10-CM | POA: Diagnosis not present

## 2015-03-06 DIAGNOSIS — F411 Generalized anxiety disorder: Secondary | ICD-10-CM | POA: Diagnosis not present

## 2015-03-08 DIAGNOSIS — F39 Unspecified mood [affective] disorder: Secondary | ICD-10-CM | POA: Diagnosis not present

## 2015-03-15 DIAGNOSIS — F39 Unspecified mood [affective] disorder: Secondary | ICD-10-CM | POA: Diagnosis not present

## 2015-04-03 DIAGNOSIS — F411 Generalized anxiety disorder: Secondary | ICD-10-CM | POA: Diagnosis not present

## 2015-04-03 DIAGNOSIS — F39 Unspecified mood [affective] disorder: Secondary | ICD-10-CM | POA: Diagnosis not present

## 2015-04-03 DIAGNOSIS — F323 Major depressive disorder, single episode, severe with psychotic features: Secondary | ICD-10-CM | POA: Diagnosis not present

## 2015-04-03 DIAGNOSIS — R4189 Other symptoms and signs involving cognitive functions and awareness: Secondary | ICD-10-CM | POA: Diagnosis not present

## 2015-04-05 DIAGNOSIS — F39 Unspecified mood [affective] disorder: Secondary | ICD-10-CM | POA: Diagnosis not present

## 2015-04-10 DIAGNOSIS — F323 Major depressive disorder, single episode, severe with psychotic features: Secondary | ICD-10-CM | POA: Diagnosis not present

## 2015-04-10 DIAGNOSIS — F39 Unspecified mood [affective] disorder: Secondary | ICD-10-CM | POA: Diagnosis not present

## 2015-04-10 DIAGNOSIS — F411 Generalized anxiety disorder: Secondary | ICD-10-CM | POA: Diagnosis not present

## 2015-04-10 DIAGNOSIS — R4189 Other symptoms and signs involving cognitive functions and awareness: Secondary | ICD-10-CM | POA: Diagnosis not present

## 2015-04-17 DIAGNOSIS — F39 Unspecified mood [affective] disorder: Secondary | ICD-10-CM | POA: Diagnosis not present

## 2015-08-07 DIAGNOSIS — H5213 Myopia, bilateral: Secondary | ICD-10-CM | POA: Diagnosis not present

## 2015-08-07 DIAGNOSIS — H52223 Regular astigmatism, bilateral: Secondary | ICD-10-CM | POA: Diagnosis not present

## 2015-08-07 DIAGNOSIS — E113293 Type 2 diabetes mellitus with mild nonproliferative diabetic retinopathy without macular edema, bilateral: Secondary | ICD-10-CM | POA: Diagnosis not present

## 2015-08-08 DIAGNOSIS — Z79899 Other long term (current) drug therapy: Secondary | ICD-10-CM | POA: Diagnosis not present

## 2015-08-08 DIAGNOSIS — Z23 Encounter for immunization: Secondary | ICD-10-CM | POA: Diagnosis not present

## 2015-08-08 DIAGNOSIS — E11319 Type 2 diabetes mellitus with unspecified diabetic retinopathy without macular edema: Secondary | ICD-10-CM | POA: Diagnosis not present

## 2015-08-08 DIAGNOSIS — E1165 Type 2 diabetes mellitus with hyperglycemia: Secondary | ICD-10-CM | POA: Diagnosis not present

## 2015-08-08 DIAGNOSIS — E785 Hyperlipidemia, unspecified: Secondary | ICD-10-CM | POA: Diagnosis not present

## 2015-08-08 DIAGNOSIS — I10 Essential (primary) hypertension: Secondary | ICD-10-CM | POA: Diagnosis not present

## 2015-08-08 DIAGNOSIS — Z0000 Encounter for general adult medical examination without abnormal findings: Secondary | ICD-10-CM | POA: Diagnosis not present

## 2015-08-08 DIAGNOSIS — Z794 Long term (current) use of insulin: Secondary | ICD-10-CM | POA: Diagnosis not present

## 2015-08-08 DIAGNOSIS — E1169 Type 2 diabetes mellitus with other specified complication: Secondary | ICD-10-CM | POA: Diagnosis not present

## 2015-09-12 DIAGNOSIS — F411 Generalized anxiety disorder: Secondary | ICD-10-CM | POA: Diagnosis not present

## 2015-09-14 ENCOUNTER — Encounter: Payer: Self-pay | Admitting: Emergency Medicine

## 2015-09-14 ENCOUNTER — Ambulatory Visit
Admission: EM | Admit: 2015-09-14 | Discharge: 2015-09-14 | Disposition: A | Discharge status: Home / Self Care | Payer: Medicare Other | Attending: Family Medicine | Admitting: Family Medicine

## 2015-09-14 DIAGNOSIS — K219 Gastro-esophageal reflux disease without esophagitis: Secondary | ICD-10-CM | POA: Diagnosis not present

## 2015-09-14 DIAGNOSIS — E119 Type 2 diabetes mellitus without complications: Secondary | ICD-10-CM | POA: Insufficient documentation

## 2015-09-14 DIAGNOSIS — F419 Anxiety disorder, unspecified: Secondary | ICD-10-CM | POA: Diagnosis not present

## 2015-09-14 DIAGNOSIS — E559 Vitamin D deficiency, unspecified: Secondary | ICD-10-CM | POA: Insufficient documentation

## 2015-09-14 DIAGNOSIS — I10 Essential (primary) hypertension: Secondary | ICD-10-CM | POA: Diagnosis not present

## 2015-09-14 DIAGNOSIS — F70 Mild intellectual disabilities: Secondary | ICD-10-CM | POA: Insufficient documentation

## 2015-09-14 DIAGNOSIS — E785 Hyperlipidemia, unspecified: Secondary | ICD-10-CM | POA: Diagnosis not present

## 2015-09-14 DIAGNOSIS — E669 Obesity, unspecified: Secondary | ICD-10-CM | POA: Insufficient documentation

## 2015-09-14 DIAGNOSIS — N390 Urinary tract infection, site not specified: Secondary | ICD-10-CM

## 2015-09-14 DIAGNOSIS — F329 Major depressive disorder, single episode, unspecified: Secondary | ICD-10-CM | POA: Diagnosis not present

## 2015-09-14 DIAGNOSIS — R300 Dysuria: Secondary | ICD-10-CM | POA: Diagnosis present

## 2015-09-14 DIAGNOSIS — Z79899 Other long term (current) drug therapy: Secondary | ICD-10-CM | POA: Diagnosis not present

## 2015-09-14 LAB — URINALYSIS COMPLETE WITH MICROSCOPIC (ARMC ONLY)
BILIRUBIN URINE: NEGATIVE
GLUCOSE, UA: NEGATIVE mg/dL
Hgb urine dipstick: NEGATIVE
KETONES UR: NEGATIVE mg/dL
Nitrite: NEGATIVE
PH: 7 (ref 5.0–8.0)
Protein, ur: NEGATIVE mg/dL
RBC / HPF: NONE SEEN RBC/hpf (ref 0–5)
Specific Gravity, Urine: 1.015 (ref 1.005–1.030)

## 2015-09-14 MED ORDER — CIPROFLOXACIN HCL 500 MG PO TABS: 500.0000 mg | ORAL_TABLET | Freq: Two times a day (BID) | ORAL | Status: AC

## 2015-09-14 NOTE — ED Notes (Signed)
Pt presents with burning when urinating for five days, denies any other sx. No acute distress.

## 2015-09-14 NOTE — Discharge Instructions (Signed)
Take medication as prescribed. Rest. Drink plenty of fluids.   Follow up with your primary care physician in one week for recheck urine to ensure clearance, or follow up sooner as needed. Return to Urgent care for new or worsening concerns.    Urinary Tract Infection Urinary tract infections (UTIs) can develop anywhere along your urinary tract. Your urinary tract is your body's drainage system for removing wastes and extra water. Your urinary tract includes two kidneys, two ureters, a bladder, and a urethra. Your kidneys are a pair of bean-shaped organs. Each kidney is about the size of your fist. They are located below your ribs, one on each side of your spine. CAUSES Infections are caused by microbes, which are microscopic organisms, including fungi, viruses, and bacteria. These organisms are so small that they can only be seen through a microscope. Bacteria are the microbes that most commonly cause UTIs. SYMPTOMS  Symptoms of UTIs may vary by age and gender of the patient and by the location of the infection. Symptoms in young women typically include a frequent and intense urge to urinate and a painful, burning feeling in the bladder or urethra during urination. Older women and men are more likely to be tired, shaky, and weak and have muscle aches and abdominal pain. A fever may mean the infection is in your kidneys. Other symptoms of a kidney infection include pain in your back or sides below the ribs, nausea, and vomiting. DIAGNOSIS To diagnose a UTI, your caregiver will ask you about your symptoms. Your caregiver will also ask you to provide a urine sample. The urine sample will be tested for bacteria and white blood cells. White blood cells are made by your body to help fight infection. TREATMENT  Typically, UTIs can be treated with medication. Because most UTIs are caused by a bacterial infection, they usually can be treated with the use of antibiotics. The choice of antibiotic and length of  treatment depend on your symptoms and the type of bacteria causing your infection. HOME CARE INSTRUCTIONS  If you were prescribed antibiotics, take them exactly as your caregiver instructs you. Finish the medication even if you feel better after you have only taken some of the medication.  Drink enough water and fluids to keep your urine clear or pale yellow.  Avoid caffeine, tea, and carbonated beverages. They tend to irritate your bladder.  Empty your bladder often. Avoid holding urine for long periods of time.  Empty your bladder before and after sexual intercourse.  After a bowel movement, women should cleanse from front to back. Use each tissue only once. SEEK MEDICAL CARE IF:   You have back pain.  You develop a fever.  Your symptoms do not begin to resolve within 3 days. SEEK IMMEDIATE MEDICAL CARE IF:   You have severe back pain or lower abdominal pain.  You develop chills.  You have nausea or vomiting.  You have continued burning or discomfort with urination. MAKE SURE YOU:   Understand these instructions.  Will watch your condition.  Will get help right away if you are not doing well or get worse.   This information is not intended to replace advice given to you by your health care provider. Make sure you discuss any questions you have with your health care provider.   Document Released: 02/06/2005 Document Revised: 01/18/2015 Document Reviewed: 06/07/2011 Elsevier Interactive Patient Education [employer].

## 2015-09-14 NOTE — ED Provider Notes (Signed)
[facility]  ____________________________________________  Time seen: Approximately 6:43 PM  I have reviewed the triage vital signs and the nursing notes.   HISTORY  Chief Complaint Dysuria   HPI Carly Collins is a 60 y.o. female presents with group home caregiver at bedside for the complaints of burning with urination for one day. Denies urinary frequency, urinary urgency, back pain or flank pain. Patient and caregiver denies any other complaints. Patient currently denies any burning with urination and repeatedly states she does not want to stay here. Patient history of mental retardation and is poor historian. Patient denies any pain or complaints at this time. Patient and caregiver denies any recent sickness or recent fevers. Denies recent antibiotic use. Caregiver and patient reports patient continues to eat and drink well.  Patient denies any chest pain, shortness of breath, abdominal pain, back pain, neck pain, nausea, vomiting, diarrhea. Denies vaginal complaints, vaginal discharge, vaginal bleeding. Denies constipation.  PCP: Lubertha Basque per patient; Lovie Macadamia also listed.  No LMP recorded. Patient is postmenopausal.   Past Medical History  Diagnosis Date  . Diabetes mellitus without complication ([place])   . Cancer ([place])   . Hypertension   . Obesity   . Hyperlipidemia   . Vitamin D deficiency   . Moderate intellectual disabilities   . Anxiety   . Depression   . Recurrent UTI   . Tibial fracture   . GERD (gastroesophageal reflux disease)   . Headache   . C. difficile enteritis   . Mental retardation, mild (I.Q. 50-70)     There are no active problems to display for this patient.   Past Surgical History  Procedure Laterality Date  . Breast surgery    . Fracture surgery    . Colon surgery    . Appendectomy    . Abdominal surgery    . Dilation and curettage of uterus    . Colonoscopy N/A 10/26/2014    Procedure: COLONOSCOPY;  Surgeon: Lollie Sails, MD;   Location: [facility] ENDOSCOPY;  Service: Endoscopy;  Laterality: N/A;  . Esophagogastroduodenoscopy (egd) with propofol N/A 10/26/2014    Procedure: ESOPHAGOGASTRODUODENOSCOPY (EGD) WITH PROPOFOL;  Surgeon: Lollie Sails, MD;  Location: [facility] ENDOSCOPY;  Service: Endoscopy;  Laterality: N/A;    Current Outpatient Rx  Name  Route  Sig  Dispense  Refill  . amLODipine (NORVASC) 5 MG tablet   Oral   Take 5 mg by mouth daily.         . Cholecalciferol (VITAMIN D-3) 1000 UNITS CAPS   Oral   Take 1,000 Units by mouth once.         . Cholecalciferol (VITAMIN D3) 2000 UNITS TABS   Oral   Take 2,000 Units by mouth once.         .           . clonazePAM (KLONOPIN) 0.5 MG tablet   Oral   Take 0.5 mg by mouth at bedtime.         . divalproex (DEPAKOTE) 250 MG DR tablet   Oral   Take 250 mg by mouth 2 (two) times daily.         Marland Kitchen docusate sodium (COLACE) 100 MG capsule   Oral   Take 100 mg by mouth 2 (two) times daily.         . ferrous gluconate (FERGON) 240 (27 FE) MG tablet   Oral   Take 240 mg by mouth 3 (three) times daily with meals.         Marland Kitchen  folic acid (FOLVITE) Q000111Q MCG tablet   Oral   Take 400 mcg by mouth daily.         . Infant Care Products (DERMACLOUD) CREA   Apply externally   Apply topically once.         Marland Kitchen ketoconazole (NIZORAL) 2 % cream   Topical   Apply 1 application topically daily.         Marland Kitchen letrozole (FEMARA) 2.5 MG tablet   Oral   Take 2.5 mg by mouth daily.         Marland Kitchen lisinopril (PRINIVIL,ZESTRIL) 20 MG tablet   Oral   Take 20 mg by mouth daily.         Marland Kitchen nystatin (MYCOSTATIN/NYSTOP) 100000 UNIT/GM POWD   Topical   Apply topically 2 (two) times daily.         Marland Kitchen omeprazole (PRILOSEC) 20 MG capsule   Oral   Take 20 mg by mouth daily.         Marland Kitchen oxycodone (OXY-IR) 5 MG capsule   Oral   Take 5 mg by mouth every 4 (four) hours as needed.         Marland Kitchen PARoxetine (PAXIL) 20 MG tablet   Oral   Take 20 mg by mouth daily.          . pravastatin (PRAVACHOL) 40 MG tablet   Oral   Take 40 mg by mouth daily.         . QUEtiapine (SEROQUEL) 100 MG tablet   Oral   Take 100 mg by mouth at bedtime.         . sitaGLIPtin (JANUVIA) 100 MG tablet   Oral   Take 100 mg by mouth daily.         . traZODone (DESYREL) 50 MG tablet   Oral   Take 50 mg by mouth at bedtime.           Allergies Actos  No family history on file.  Social History Social History  Substance Use Topics  . Smoking status: Never Smoker   . Smokeless tobacco: None  . Alcohol Use: No    Review of Systems Constitutional: No fever/chills Eyes: No visual changes. ENT: No sore throat. Cardiovascular: Denies chest pain. Respiratory: Denies shortness of breath. Gastrointestinal: No abdominal pain.  No nausea, no vomiting.  No diarrhea.  No constipation. Genitourinary: Positive for dysuria. Musculoskeletal: Negative for back pain. Skin: Negative for rash. Neurological: Negative for headaches, focal weakness or numbness.  10-point ROS otherwise negative.  ____________________________________________   PHYSICAL EXAM:  VITAL SIGNS: ED Triage Vitals  Enc Vitals Group     BP 09/14/15 1702 119/53 mmHg     Pulse Rate 09/14/15 1702 86     Resp 09/14/15 1702 20     Temp 09/14/15 1702 97.3 F (36.3 C)     Temp Source 09/14/15 1702 Tympanic     SpO2 09/14/15 1702 97 %     Weight 09/14/15 1702 202 lb (91.627 kg)     Height 09/14/15 1702 5\' 3"  (1.6 m)     Head Cir --      Peak Flow --      Pain Score 09/14/15 1704 10     Pain Loc --      Pain Edu? --      Excl. in [place]? --     Constitutional: Alert and oriented. Well appearing and in no acute distress. Eyes: Conjunctivae are normal. PERRL. EOMI. Head: Atraumatic.  Nose:  No congestion/rhinnorhea.  Mouth/Throat: Mucous membranes are moist.   Neck: No stridor.  No cervical spine tenderness to palpation.. Cardiovascular: Normal rate, regular rhythm. Grossly normal heart  sounds.  Good peripheral circulation. Respiratory: Normal respiratory effort.  No retractions. Lungs CTAB. No wheezes, rales or rhonchi. Good air movement. Gastrointestinal: Soft and nontender. Obese abdomen. Normal Bowel sounds. No CVA tenderness. Musculoskeletal: No lower or upper extremity tenderness nor edema.  Bilateral pedal pulses equal and easily palpated.  No cervical, thoracic, or lumbar tenderness to palpation.  Neurologic:  Normal speech and language. No gross focal neurologic deficits are appreciated. No gait instability. Skin:  Skin is warm, dry and intact. No rash noted. Psychiatric: Mood and affect are normal. Speech and behavior are normal.  ____________________________________________   LABS (all labs ordered are listed, but only abnormal results are displayed)  Labs Reviewed  URINALYSIS COMPLETEWITH MICROSCOPIC ([place] ONLY) - Abnormal; Notable for the following:    APPearance CLOUDY (*)    Leukocytes, UA 2+ (*)    Bacteria, UA MANY (*)    Squamous Epithelial / LPF 6-30 (*)    All other components within normal limits    INITIAL IMPRESSION / ASSESSMENT AND PLAN / ED COURSE  Pertinent labs & imaging results that were available during my care of the patient were reviewed by me and considered in my medical decision making (see chart for details).  Well-appearing patient. No acute distress. Ambulatory in room. Presents for the complaint of one day of burning with urination. Patient currently denies any burning with urination. Caregiver reports that patient had complained of burning with urination for one day. Denies fevers, change in behaviors or other complaints. Per patient chart, history of urinary tract infections in the past. Urinalysis positive for many bacteria, 6-30 WBCs, 2+ leukocytes and cloudy appearance. Will culture urinalysis. Will treat uti with oral Cipro. Encourage fluids and PCP follow-up and encouraged repeat and follow-up urinalysis in 1 week to ensure  clearance.  Discussed follow up with Primary care physician this week. Discussed follow up and return parameters including no resolution or any worsening concerns. Patient and group home representative verbalized understanding and agreed to plan.   ____________________________________________   FINAL CLINICAL IMPRESSION(S) / ED DIAGNOSES  Final diagnoses:  UTI (lower urinary tract infection)      Note: This dictation was prepared with Dragon dictation along with smaller phrase technology. Any transcriptional errors that result from this process are unintentional.    Marylene Land, NP 09/14/15 [place], NP 09/14/15 [id number]

## 2015-09-16 LAB — URINE CULTURE

## 2015-11-03 ENCOUNTER — Encounter: Payer: Self-pay | Admitting: *Deleted

## 2015-11-03 ENCOUNTER — Ambulatory Visit
Admission: EM | Admit: 2015-11-03 | Discharge: 2015-11-03 | Disposition: A | Discharge status: Home / Self Care | Payer: Medicare Other | Attending: Family Medicine | Admitting: Family Medicine

## 2015-11-03 ENCOUNTER — Ambulatory Visit: Payer: Medicare Other

## 2015-11-03 DIAGNOSIS — S5012XA Contusion of left forearm, initial encounter: Secondary | ICD-10-CM | POA: Diagnosis not present

## 2015-11-03 DIAGNOSIS — F70 Mild intellectual disabilities: Secondary | ICD-10-CM | POA: Insufficient documentation

## 2015-11-03 DIAGNOSIS — E559 Vitamin D deficiency, unspecified: Secondary | ICD-10-CM | POA: Insufficient documentation

## 2015-11-03 DIAGNOSIS — Z79899 Other long term (current) drug therapy: Secondary | ICD-10-CM | POA: Diagnosis not present

## 2015-11-03 DIAGNOSIS — E785 Hyperlipidemia, unspecified: Secondary | ICD-10-CM | POA: Insufficient documentation

## 2015-11-03 DIAGNOSIS — K219 Gastro-esophageal reflux disease without esophagitis: Secondary | ICD-10-CM | POA: Insufficient documentation

## 2015-11-03 DIAGNOSIS — E669 Obesity, unspecified: Secondary | ICD-10-CM | POA: Insufficient documentation

## 2015-11-03 DIAGNOSIS — Z6834 Body mass index (BMI) 34.0-34.9, adult: Secondary | ICD-10-CM | POA: Diagnosis not present

## 2015-11-03 DIAGNOSIS — S59912A Unspecified injury of left forearm, initial encounter: Secondary | ICD-10-CM | POA: Diagnosis not present

## 2015-11-03 DIAGNOSIS — E119 Type 2 diabetes mellitus without complications: Secondary | ICD-10-CM | POA: Insufficient documentation

## 2015-11-03 DIAGNOSIS — M79632 Pain in left forearm: Secondary | ICD-10-CM | POA: Diagnosis present

## 2015-11-03 DIAGNOSIS — I10 Essential (primary) hypertension: Secondary | ICD-10-CM | POA: Diagnosis not present

## 2015-11-03 DIAGNOSIS — W19XXXA Unspecified fall, initial encounter: Secondary | ICD-10-CM | POA: Diagnosis not present

## 2015-11-03 NOTE — ED Notes (Signed)
Pt in group home. Fell sometime Wednesday night. C/o left hand pain since fall. No obvious deformity or edema.

## 2015-11-03 NOTE — Discharge Instructions (Signed)

## 2015-11-03 NOTE — ED Provider Notes (Signed)
CSN: [id number]     Arrival date & time 11/03/15  1638 History   First MD Initiated Contact with Patient 11/03/15 1744     Chief Complaint  Patient presents with  . Hand Injury   (Consider location/radiation/quality/duration/timing/severity/associated sxs/prior Treatment) HPI Comments: 60 yo female presents with caregiver from group home with a complaint of a fall and left forearm pain. Denies numbness/tingling, laceration. Denies hitting her head.   The history is provided by the patient.    Past Medical History  Diagnosis Date  . Diabetes mellitus without complication ([place])   . Cancer ([place])   . Hypertension   . Obesity   . Hyperlipidemia   . Vitamin D deficiency   . Moderate intellectual disabilities   . Anxiety   . Depression   . Recurrent UTI   . Tibial fracture   . GERD (gastroesophageal reflux disease)   . Headache   . C. difficile enteritis   . Mental retardation, mild (I.Q. 50-70)    Past Surgical History  Procedure Laterality Date  . Breast surgery    . Fracture surgery    . Colon surgery    . Appendectomy    . Abdominal surgery    . Dilation and curettage of uterus    . Colonoscopy N/A 10/26/2014    Procedure: COLONOSCOPY;  Surgeon: Lollie Sails, MD;  Location: [facility] ENDOSCOPY;  Service: Endoscopy;  Laterality: N/A;  . Esophagogastroduodenoscopy (egd) with propofol N/A 10/26/2014    Procedure: ESOPHAGOGASTRODUODENOSCOPY (EGD) WITH PROPOFOL;  Surgeon: Lollie Sails, MD;  Location: [facility] ENDOSCOPY;  Service: Endoscopy;  Laterality: N/A;   History reviewed. No pertinent family history. Social History  Substance Use Topics  . Smoking status: Never Smoker   . Smokeless tobacco: None  . Alcohol Use: No   OB History    No data available     Review of Systems  Allergies  Actos  Home Medications   Prior to Admission medications   Medication Sig Start Date End Date Taking? Authorizing Provider  amLODipine (NORVASC) 5 MG tablet Take 5 mg by mouth  daily.    Historical Provider, MD  Cholecalciferol (VITAMIN D-3) 1000 UNITS CAPS Take 1,000 Units by mouth once.    Historical Provider, MD  Cholecalciferol (VITAMIN D3) 2000 UNITS TABS Take 2,000 Units by mouth once.    Historical Provider, MD  clonazePAM (KLONOPIN) 0.5 MG tablet Take 0.5 mg by mouth at bedtime.    Historical Provider, MD  divalproex (DEPAKOTE) 250 MG DR tablet Take 250 mg by mouth 2 (two) times daily.    Historical Provider, MD  docusate sodium (COLACE) 100 MG capsule Take 100 mg by mouth 2 (two) times daily.    Historical Provider, MD  ferrous gluconate (FERGON) 240 (27 FE) MG tablet Take 240 mg by mouth 3 (three) times daily with meals.    Historical Provider, MD  folic acid (FOLVITE) Q000111Q MCG tablet Take 400 mcg by mouth daily.    Historical Provider, MD  [facility]) CREA Apply topically once.    Historical Provider, MD  ketoconazole (NIZORAL) 2 % cream Apply 1 application topically daily.    Historical Provider, MD  letrozole (FEMARA) 2.5 MG tablet Take 2.5 mg by mouth daily.    Historical Provider, MD  lisinopril (PRINIVIL,ZESTRIL) 20 MG tablet Take 20 mg by mouth daily.    Historical Provider, MD  nystatin (MYCOSTATIN/NYSTOP) 100000 UNIT/GM POWD Apply topically 2 (two) times daily.    Historical Provider, MD  omeprazole (  PRILOSEC) 20 MG capsule Take 20 mg by mouth daily.    Historical Provider, MD  oxycodone (OXY-IR) 5 MG capsule Take 5 mg by mouth every 4 (four) hours as needed.    Historical Provider, MD  PARoxetine (PAXIL) 20 MG tablet Take 20 mg by mouth daily.    Historical Provider, MD  pravastatin (PRAVACHOL) 40 MG tablet Take 40 mg by mouth daily.    Historical Provider, MD  QUEtiapine (SEROQUEL) 100 MG tablet Take 100 mg by mouth at bedtime.    Historical Provider, MD  sitaGLIPtin (JANUVIA) 100 MG tablet Take 100 mg by mouth daily.    Historical Provider, MD  traZODone (DESYREL) 50 MG tablet Take 50 mg by mouth at bedtime.    Historical  Provider, MD   Meds Ordered and Administered this Visit  Medications - No data to display  BP 111/70 mmHg  Pulse 76  Temp(Src) 97.6 F (36.4 C) (Oral)  Resp 20  Ht 5\' 7"  (1.702 m)  Wt 218 lb (98.884 kg)  BMI 34.14 kg/m2  SpO2 100% No data found.   Physical Exam  Constitutional: She appears well-developed and well-nourished. No distress.  Musculoskeletal:       Left forearm: She exhibits tenderness (over the distal forearm), bony tenderness and swelling (mild). She exhibits no edema, no deformity and no laceration.  Skin: She is not diaphoretic.  Nursing note and vitals reviewed.   ED Course  Procedures (including critical care time)  Labs Review Labs Reviewed - No data to display  Imaging Review Dg Forearm Left  11/03/2015  CLINICAL DATA:  Fall 2 days prior with left forearm pain. EXAM: LEFT FOREARM - 2 VIEW COMPARISON:  None. FINDINGS: No acute fracture or suspicious focal osseous lesion is seen in the left radius or left ulna. There is healed deformity in the left distal humerus with probable heterotopic calcification anterior to the distal left humeral epiphysis. Diffuse osteopenia. No evidence of dislocation at the left wrist or left elbow. IMPRESSION: No acute fracture. Diffuse osteopenia. Healed deformity in the left distal humerus. Electronically Signed   By: Ilona Sorrel M.D.   On: 11/03/2015 19:12     Visual Acuity Review  Right Eye Distance:   Left Eye Distance:   Bilateral Distance:    Right Eye Near:   Left Eye Near:    Bilateral Near:         MDM   1. Contusion, forearm, left, initial encounter     Discharge Medication List as of 11/03/2015  7:25 PM     1. x-ray results (negative) and diagnosis reviewed with patient 2. Recommend supportive treatment with otc analgesics, ice 3. Follow-up prn if symptoms worsen or don't improve   Norval Gable, MD 11/03/15 2045

## 2015-11-15 DIAGNOSIS — R42 Dizziness and giddiness: Secondary | ICD-10-CM | POA: Diagnosis not present

## 2015-11-15 DIAGNOSIS — E1169 Type 2 diabetes mellitus with other specified complication: Secondary | ICD-10-CM | POA: Diagnosis not present

## 2015-11-15 DIAGNOSIS — I10 Essential (primary) hypertension: Secondary | ICD-10-CM | POA: Diagnosis not present

## 2015-11-15 DIAGNOSIS — Z79899 Other long term (current) drug therapy: Secondary | ICD-10-CM | POA: Diagnosis not present

## 2015-11-15 DIAGNOSIS — E113293 Type 2 diabetes mellitus with mild nonproliferative diabetic retinopathy without macular edema, bilateral: Secondary | ICD-10-CM | POA: Diagnosis not present

## 2015-11-15 DIAGNOSIS — Z794 Long term (current) use of insulin: Secondary | ICD-10-CM | POA: Diagnosis not present

## 2015-11-15 DIAGNOSIS — F71 Moderate intellectual disabilities: Secondary | ICD-10-CM | POA: Insufficient documentation

## 2015-11-15 DIAGNOSIS — E559 Vitamin D deficiency, unspecified: Secondary | ICD-10-CM | POA: Diagnosis not present

## 2015-11-15 DIAGNOSIS — D509 Iron deficiency anemia, unspecified: Secondary | ICD-10-CM | POA: Diagnosis not present

## 2015-12-13 DIAGNOSIS — E119 Type 2 diabetes mellitus without complications: Secondary | ICD-10-CM | POA: Diagnosis not present

## 2015-12-13 DIAGNOSIS — B351 Tinea unguium: Secondary | ICD-10-CM | POA: Diagnosis not present

## 2015-12-13 DIAGNOSIS — M79674 Pain in right toe(s): Secondary | ICD-10-CM | POA: Diagnosis not present

## 2015-12-13 DIAGNOSIS — M79675 Pain in left toe(s): Secondary | ICD-10-CM | POA: Diagnosis not present

## 2015-12-26 DIAGNOSIS — F411 Generalized anxiety disorder: Secondary | ICD-10-CM | POA: Diagnosis not present

## 2016-01-08 ENCOUNTER — Encounter: Payer: Self-pay | Admitting: Emergency Medicine

## 2016-01-08 ENCOUNTER — Ambulatory Visit
Admission: EM | Admit: 2016-01-08 | Discharge: 2016-01-08 | Disposition: A | Discharge status: Home / Self Care | Payer: Medicare Other | Attending: Family Medicine | Admitting: Family Medicine

## 2016-01-08 DIAGNOSIS — E119 Type 2 diabetes mellitus without complications: Secondary | ICD-10-CM | POA: Diagnosis not present

## 2016-01-08 DIAGNOSIS — E785 Hyperlipidemia, unspecified: Secondary | ICD-10-CM | POA: Insufficient documentation

## 2016-01-08 DIAGNOSIS — K219 Gastro-esophageal reflux disease without esophagitis: Secondary | ICD-10-CM | POA: Insufficient documentation

## 2016-01-08 DIAGNOSIS — E559 Vitamin D deficiency, unspecified: Secondary | ICD-10-CM | POA: Diagnosis not present

## 2016-01-08 DIAGNOSIS — F329 Major depressive disorder, single episode, unspecified: Secondary | ICD-10-CM | POA: Insufficient documentation

## 2016-01-08 DIAGNOSIS — N390 Urinary tract infection, site not specified: Secondary | ICD-10-CM | POA: Diagnosis not present

## 2016-01-08 DIAGNOSIS — Z79899 Other long term (current) drug therapy: Secondary | ICD-10-CM | POA: Insufficient documentation

## 2016-01-08 DIAGNOSIS — E669 Obesity, unspecified: Secondary | ICD-10-CM | POA: Insufficient documentation

## 2016-01-08 DIAGNOSIS — F70 Mild intellectual disabilities: Secondary | ICD-10-CM | POA: Insufficient documentation

## 2016-01-08 DIAGNOSIS — I10 Essential (primary) hypertension: Secondary | ICD-10-CM | POA: Diagnosis not present

## 2016-01-08 DIAGNOSIS — R300 Dysuria: Secondary | ICD-10-CM | POA: Diagnosis present

## 2016-01-08 LAB — URINALYSIS COMPLETE WITH MICROSCOPIC (ARMC ONLY)
BILIRUBIN URINE: NEGATIVE
GLUCOSE, UA: NEGATIVE mg/dL
Hgb urine dipstick: NEGATIVE
KETONES UR: NEGATIVE mg/dL
Nitrite: NEGATIVE
PH: 7.5 (ref 5.0–8.0)
Protein, ur: NEGATIVE mg/dL
RBC / HPF: NONE SEEN RBC/hpf (ref 0–5)
Specific Gravity, Urine: 1.015 (ref 1.005–1.030)

## 2016-01-08 MED ORDER — FLUCONAZOLE 150 MG PO TABS: 150.0000 mg | ORAL_TABLET | Freq: Every day | ORAL | 1 refills | Status: DC

## 2016-01-08 MED ORDER — CIPROFLOXACIN HCL 500 MG PO TABS: 500.0000 mg | ORAL_TABLET | Freq: Two times a day (BID) | ORAL | 0 refills | Status: DC

## 2016-01-08 NOTE — ED Provider Notes (Signed)
[facility]    CSN: [id number] Arrival date & time: 01/08/16  1751  First Provider Contact:  None       History   Chief Complaint Chief Complaint  Patient presents with  . Dysuria    HPI Carly Collins is a 60 y.o. female.   The history is provided by a caregiver.     Patient c/o urinary urgency and frequency for several days.           Denies any fevers, chills, vomiting.   Past Medical History:  Diagnosis Date  . Anxiety   . C. difficile enteritis   . Cancer ([place])   . Depression   . Diabetes mellitus without complication ([place])   . GERD (gastroesophageal reflux disease)   . Headache   . Hyperlipidemia   . Hypertension   . Mental retardation, mild (I.Q. 50-70)   . Moderate intellectual disabilities   . Obesity   . Recurrent UTI   . Tibial fracture   . Vitamin D deficiency     There are no active problems to display for this patient.   Past Surgical History:  Procedure Laterality Date  . ABDOMINAL SURGERY    . APPENDECTOMY    . BREAST SURGERY    . COLON SURGERY    . COLONOSCOPY N/A 10/26/2014   Procedure: COLONOSCOPY;  Surgeon: Lollie Sails, MD;  Location: [facility] ENDOSCOPY;  Service: Endoscopy;  Laterality: N/A;  . DILATION AND CURETTAGE OF UTERUS    . ESOPHAGOGASTRODUODENOSCOPY (EGD) WITH PROPOFOL N/A 10/26/2014   Procedure: ESOPHAGOGASTRODUODENOSCOPY (EGD) WITH PROPOFOL;  Surgeon: Lollie Sails, MD;  Location: [facility] ENDOSCOPY;  Service: Endoscopy;  Laterality: N/A;  . FRACTURE SURGERY      OB History    No data available       Home Medications    Prior to Admission medications   Medication Sig Start Date End Date Taking? Authorizing Provider  amLODipine (NORVASC) 5 MG tablet Take 5 mg by mouth daily.    Historical Provider, MD  Cholecalciferol (VITAMIN D-3) 1000 UNITS CAPS Take 1,000 Units by mouth once.    Historical Provider, MD  Cholecalciferol (VITAMIN D3) 2000 UNITS TABS Take 2,000 Units by mouth once.    Historical  Provider, MD  ciprofloxacin (CIPRO) 500 MG tablet Take 1 tablet (500 mg total) by mouth every 12 (twelve) hours. 01/08/16   Norval Gable, MD  clonazePAM (KLONOPIN) 0.5 MG tablet Take 0.5 mg by mouth at bedtime.    Historical Provider, MD  divalproex (DEPAKOTE) 250 MG DR tablet Take 250 mg by mouth 2 (two) times daily.    Historical Provider, MD  docusate sodium (COLACE) 100 MG capsule Take 100 mg by mouth 2 (two) times daily.    Historical Provider, MD  ferrous gluconate (FERGON) 240 (27 FE) MG tablet Take 240 mg by mouth 3 (three) times daily with meals.    Historical Provider, MD  fluconazole (DIFLUCAN) 150 MG tablet Take 1 tablet (150 mg total) by mouth daily. 01/08/16   Norval Gable, MD  folic acid (FOLVITE) Q000111Q MCG tablet Take 400 mcg by mouth daily.    Historical Provider, MD  [facility]) CREA Apply topically once.    Historical Provider, MD  ketoconazole (NIZORAL) 2 % cream Apply 1 application topically daily.    Historical Provider, MD  letrozole (FEMARA) 2.5 MG tablet Take 2.5 mg by mouth daily.    Historical Provider, MD  lisinopril (PRINIVIL,ZESTRIL) 20 MG tablet Take 20 mg by  mouth daily.    Historical Provider, MD  nystatin (MYCOSTATIN/NYSTOP) 100000 UNIT/GM POWD Apply topically 2 (two) times daily.    Historical Provider, MD  omeprazole (PRILOSEC) 20 MG capsule Take 20 mg by mouth daily.    Historical Provider, MD  oxycodone (OXY-IR) 5 MG capsule Take 5 mg by mouth every 4 (four) hours as needed.    Historical Provider, MD  PARoxetine (PAXIL) 20 MG tablet Take 20 mg by mouth daily.    Historical Provider, MD  pravastatin (PRAVACHOL) 40 MG tablet Take 40 mg by mouth daily.    Historical Provider, MD  QUEtiapine (SEROQUEL) 100 MG tablet Take 100 mg by mouth at bedtime.    Historical Provider, MD  sitaGLIPtin (JANUVIA) 100 MG tablet Take 100 mg by mouth daily.    Historical Provider, MD  traZODone (DESYREL) 50 MG tablet Take 50 mg by mouth at bedtime.    Historical  Provider, MD    Family History History reviewed. No pertinent family history.  Social History Social History  Substance Use Topics  . Smoking status: Never Smoker  . Smokeless tobacco: Never Used  . Alcohol use No     Allergies   Actos [pioglitazone]   Review of Systems Review of Systems   Physical Exam Triage Vital Signs ED Triage Vitals  Enc Vitals Group     BP 01/08/16 1850 129/72     Pulse Rate 01/08/16 1850 86     Resp 01/08/16 1850 16     Temp 01/08/16 1850 97.9 F (36.6 C)     Temp Source 01/08/16 1850 Tympanic     SpO2 01/08/16 1850 99 %     Weight 01/08/16 1852 218 lb (98.9 kg)     Height --      Head Circumference --      Peak Flow --      Pain Score 01/08/16 1853 0     Pain Loc --      Pain Edu? --      Excl. in [place]? --    No data found.   Updated Vital Signs BP 129/72 (BP Location: Left Arm)   Pulse 86   Temp 97.9 F (36.6 C) (Tympanic)   Resp 16   Wt 218 lb (98.9 kg)   SpO2 99%   BMI 34.14 kg/m   Visual Acuity Right Eye Distance:   Left Eye Distance:   Bilateral Distance:    Right Eye Near:   Left Eye Near:    Bilateral Near:     Physical Exam  Constitutional: She appears well-developed and well-nourished. No distress.  Abdominal: Soft. Bowel sounds are normal. She exhibits no distension and no mass. There is no tenderness. There is no rebound and no guarding.  Skin: She is not diaphoretic.  Nursing note and vitals reviewed.    UC Treatments / Results  Labs (all labs ordered are listed, but only abnormal results are displayed) Labs Reviewed  URINALYSIS COMPLETEWITH MICROSCOPIC ([place]) - Abnormal; Notable for the following:       Result Value   Leukocytes, UA SMALL (*)    Bacteria, UA RARE (*)    Squamous Epithelial / LPF 0-5 (*)    All other components within normal limits    EKG  EKG Interpretation None       Radiology No results found.  Procedures Procedures (including critical care  time)  Medications Ordered in UC Medications - No data to display   Initial Impression / Assessment and Plan /  UC Course  I have reviewed the triage vital signs and the nursing notes.  Pertinent labs & imaging results that were available during my care of the patient were reviewed by me and considered in my medical decision making (see chart for details).  Clinical Course      Final Clinical Impressions(s) / UC Diagnoses   Final diagnoses:  UTI (lower urinary tract infection)    New Prescriptions Discharge Medication List as of 01/08/2016  7:31 PM    START taking these medications   Details  ciprofloxacin (CIPRO) 500 MG tablet Take 1 tablet (500 mg total) by mouth every 12 (twelve) hours., Starting Mon 01/08/2016, Print    fluconazole (DIFLUCAN) 150 MG tablet Take 1 tablet (150 mg total) by mouth daily., Starting Mon 01/08/2016, Print       1. Lab results and diagnosis reviewed with patient and caregiver 2. rx as per orders above; reviewed possible side effects, interactions, risks and benefits  3. Recommend supportive treatment with increased fluids 4. Follow-up prn if symptoms worsen or don't improve   Norval Gable, MD 01/08/16 2110

## 2016-01-08 NOTE — ED Triage Notes (Signed)
Patient c/o urinary urgency and frequency for several days.

## 2016-01-31 DIAGNOSIS — B372 Candidiasis of skin and nail: Secondary | ICD-10-CM | POA: Diagnosis not present

## 2016-01-31 DIAGNOSIS — B373 Candidiasis of vulva and vagina: Secondary | ICD-10-CM | POA: Diagnosis not present

## 2016-01-31 DIAGNOSIS — R300 Dysuria: Secondary | ICD-10-CM | POA: Diagnosis not present

## 2016-03-12 DIAGNOSIS — R350 Frequency of micturition: Secondary | ICD-10-CM | POA: Diagnosis not present

## 2016-03-12 DIAGNOSIS — Z23 Encounter for immunization: Secondary | ICD-10-CM | POA: Diagnosis not present

## 2016-03-12 DIAGNOSIS — E785 Hyperlipidemia, unspecified: Secondary | ICD-10-CM | POA: Diagnosis not present

## 2016-03-12 DIAGNOSIS — R300 Dysuria: Secondary | ICD-10-CM | POA: Diagnosis not present

## 2016-03-12 DIAGNOSIS — E113293 Type 2 diabetes mellitus with mild nonproliferative diabetic retinopathy without macular edema, bilateral: Secondary | ICD-10-CM | POA: Diagnosis not present

## 2016-03-12 DIAGNOSIS — I10 Essential (primary) hypertension: Secondary | ICD-10-CM | POA: Diagnosis not present

## 2016-03-12 DIAGNOSIS — D509 Iron deficiency anemia, unspecified: Secondary | ICD-10-CM | POA: Diagnosis not present

## 2016-03-12 DIAGNOSIS — Z794 Long term (current) use of insulin: Secondary | ICD-10-CM | POA: Diagnosis not present

## 2016-03-12 DIAGNOSIS — E1169 Type 2 diabetes mellitus with other specified complication: Secondary | ICD-10-CM | POA: Diagnosis not present

## 2016-03-19 DIAGNOSIS — F411 Generalized anxiety disorder: Secondary | ICD-10-CM | POA: Diagnosis not present

## 2016-06-05 DIAGNOSIS — H2513 Age-related nuclear cataract, bilateral: Secondary | ICD-10-CM | POA: Diagnosis not present

## 2016-06-05 DIAGNOSIS — H5213 Myopia, bilateral: Secondary | ICD-10-CM | POA: Diagnosis not present

## 2016-06-05 DIAGNOSIS — E113292 Type 2 diabetes mellitus with mild nonproliferative diabetic retinopathy without macular edema, left eye: Secondary | ICD-10-CM | POA: Diagnosis not present

## 2016-06-05 DIAGNOSIS — E113311 Type 2 diabetes mellitus with moderate nonproliferative diabetic retinopathy with macular edema, right eye: Secondary | ICD-10-CM | POA: Diagnosis not present

## 2016-06-05 DIAGNOSIS — H52223 Regular astigmatism, bilateral: Secondary | ICD-10-CM | POA: Diagnosis not present

## 2016-06-24 DIAGNOSIS — E113311 Type 2 diabetes mellitus with moderate nonproliferative diabetic retinopathy with macular edema, right eye: Secondary | ICD-10-CM | POA: Diagnosis not present

## 2016-08-05 DIAGNOSIS — E785 Hyperlipidemia, unspecified: Secondary | ICD-10-CM | POA: Diagnosis not present

## 2016-08-05 DIAGNOSIS — E113293 Type 2 diabetes mellitus with mild nonproliferative diabetic retinopathy without macular edema, bilateral: Secondary | ICD-10-CM | POA: Diagnosis not present

## 2016-08-05 DIAGNOSIS — Z794 Long term (current) use of insulin: Secondary | ICD-10-CM | POA: Diagnosis not present

## 2016-08-05 DIAGNOSIS — D509 Iron deficiency anemia, unspecified: Secondary | ICD-10-CM | POA: Diagnosis not present

## 2016-08-05 DIAGNOSIS — E1169 Type 2 diabetes mellitus with other specified complication: Secondary | ICD-10-CM | POA: Diagnosis not present

## 2016-08-09 DIAGNOSIS — C50911 Malignant neoplasm of unspecified site of right female breast: Secondary | ICD-10-CM | POA: Diagnosis not present

## 2016-08-13 DIAGNOSIS — E113311 Type 2 diabetes mellitus with moderate nonproliferative diabetic retinopathy with macular edema, right eye: Secondary | ICD-10-CM | POA: Diagnosis not present

## 2016-08-19 ENCOUNTER — Ambulatory Visit (INDEPENDENT_AMBULATORY_CARE_PROVIDER_SITE_OTHER): Payer: Medicare Other | Admitting: Podiatry

## 2016-08-19 DIAGNOSIS — L603 Nail dystrophy: Secondary | ICD-10-CM

## 2016-08-19 NOTE — Progress Notes (Signed)
Subjective:     Patient ID: Carly Collins, female   DOB: March 02, 1956, 61 y.o.   MRN: [id number]  HPI this patient presents the office today with long, ingrowing toenails on both feet. She comes from a home that she states in  [place].  This patient is diabetic. She presents the office today for preventative foot care services   Review of Systems     Objective:   Physical Exam GENERAL APPEARANCE: Alert, conversant. Appropriately groomed. No acute distress.  VASCULAR: Pedal pulses are  palpable at  [facility] and PT bilateral.  Capillary refill time is immediate to all digits,  Normal temperature gradient.  Digital hair growth is present bilateral  NEUROLOGIC: sensation is normal to 5.07 monofilament at 5/5 sites bilateral.  Light touch is intact bilateral, Muscle strength normal.  MUSCULOSKELETAL: acceptable muscle strength, tone and stability bilateral.  Intrinsic muscluature intact bilateral.  HAV 1st MPJ  B/L  DERMATOLOGIC: skin color, texture, and turgor are within normal limits.  No preulcerative lesions or ulcers  are seen, no interdigital maceration noted.  No open lesions present.  Digital nails are elongated No drainage noted.      Assessment:     Onychodystrophy    Plan:     IE  Debride nails  RTC 4 months.  Gardiner Barefoot DPM

## 2016-08-29 DIAGNOSIS — E113293 Type 2 diabetes mellitus with mild nonproliferative diabetic retinopathy without macular edema, bilateral: Secondary | ICD-10-CM | POA: Diagnosis not present

## 2016-08-29 DIAGNOSIS — Z853 Personal history of malignant neoplasm of breast: Secondary | ICD-10-CM | POA: Insufficient documentation

## 2016-08-29 DIAGNOSIS — I10 Essential (primary) hypertension: Secondary | ICD-10-CM | POA: Diagnosis not present

## 2016-08-29 DIAGNOSIS — E1169 Type 2 diabetes mellitus with other specified complication: Secondary | ICD-10-CM | POA: Diagnosis not present

## 2016-08-29 DIAGNOSIS — E785 Hyperlipidemia, unspecified: Secondary | ICD-10-CM | POA: Diagnosis not present

## 2016-08-29 DIAGNOSIS — Z794 Long term (current) use of insulin: Secondary | ICD-10-CM | POA: Diagnosis not present

## 2016-08-29 DIAGNOSIS — Z0000 Encounter for general adult medical examination without abnormal findings: Secondary | ICD-10-CM | POA: Diagnosis not present

## 2016-09-17 DIAGNOSIS — F411 Generalized anxiety disorder: Secondary | ICD-10-CM | POA: Diagnosis not present

## 2016-09-25 DIAGNOSIS — I10 Essential (primary) hypertension: Secondary | ICD-10-CM | POA: Diagnosis not present

## 2016-09-25 DIAGNOSIS — F329 Major depressive disorder, single episode, unspecified: Secondary | ICD-10-CM | POA: Diagnosis not present

## 2016-09-25 DIAGNOSIS — E113311 Type 2 diabetes mellitus with moderate nonproliferative diabetic retinopathy with macular edema, right eye: Secondary | ICD-10-CM | POA: Diagnosis not present

## 2016-09-25 DIAGNOSIS — F419 Anxiety disorder, unspecified: Secondary | ICD-10-CM | POA: Diagnosis not present

## 2016-09-25 DIAGNOSIS — E11311 Type 2 diabetes mellitus with unspecified diabetic retinopathy with macular edema: Secondary | ICD-10-CM | POA: Diagnosis not present

## 2016-09-25 DIAGNOSIS — E785 Hyperlipidemia, unspecified: Secondary | ICD-10-CM | POA: Diagnosis not present

## 2016-09-25 DIAGNOSIS — E669 Obesity, unspecified: Secondary | ICD-10-CM | POA: Diagnosis not present

## 2016-10-15 DIAGNOSIS — Z01818 Encounter for other preprocedural examination: Secondary | ICD-10-CM | POA: Diagnosis not present

## 2016-10-15 DIAGNOSIS — F323 Major depressive disorder, single episode, severe with psychotic features: Secondary | ICD-10-CM | POA: Diagnosis not present

## 2016-10-15 DIAGNOSIS — E118 Type 2 diabetes mellitus with unspecified complications: Secondary | ICD-10-CM | POA: Diagnosis not present

## 2016-10-15 DIAGNOSIS — K219 Gastro-esophageal reflux disease without esophagitis: Secondary | ICD-10-CM | POA: Diagnosis not present

## 2016-10-15 DIAGNOSIS — I10 Essential (primary) hypertension: Secondary | ICD-10-CM | POA: Diagnosis not present

## 2016-10-15 DIAGNOSIS — F819 Developmental disorder of scholastic skills, unspecified: Secondary | ICD-10-CM | POA: Diagnosis not present

## 2016-10-16 ENCOUNTER — Encounter: Payer: Self-pay | Admitting: Obstetrics and Gynecology

## 2016-10-16 ENCOUNTER — Ambulatory Visit (INDEPENDENT_AMBULATORY_CARE_PROVIDER_SITE_OTHER): Payer: Medicare Other | Admitting: Obstetrics and Gynecology

## 2016-10-16 VITALS — BP 121/64 | HR 73 | Ht 67.0 in | Wt 162.6 lb

## 2016-10-16 DIAGNOSIS — F419 Anxiety disorder, unspecified: Secondary | ICD-10-CM

## 2016-10-16 DIAGNOSIS — F79 Unspecified intellectual disabilities: Secondary | ICD-10-CM | POA: Diagnosis not present

## 2016-10-16 DIAGNOSIS — N904 Leukoplakia of vulva: Secondary | ICD-10-CM

## 2016-10-16 DIAGNOSIS — R21 Rash and other nonspecific skin eruption: Secondary | ICD-10-CM

## 2016-10-16 DIAGNOSIS — K219 Gastro-esophageal reflux disease without esophagitis: Secondary | ICD-10-CM | POA: Insufficient documentation

## 2016-10-16 DIAGNOSIS — F32A Depression, unspecified: Secondary | ICD-10-CM | POA: Insufficient documentation

## 2016-10-16 DIAGNOSIS — L292 Pruritus vulvae: Secondary | ICD-10-CM

## 2016-10-16 DIAGNOSIS — Z1211 Encounter for screening for malignant neoplasm of colon: Secondary | ICD-10-CM | POA: Diagnosis not present

## 2016-10-16 DIAGNOSIS — Z124 Encounter for screening for malignant neoplasm of cervix: Secondary | ICD-10-CM

## 2016-10-16 DIAGNOSIS — F329 Major depressive disorder, single episode, unspecified: Secondary | ICD-10-CM | POA: Insufficient documentation

## 2016-10-16 DIAGNOSIS — F331 Major depressive disorder, recurrent, moderate: Secondary | ICD-10-CM | POA: Insufficient documentation

## 2016-10-16 MED ORDER — NYSTATIN-TRIAMCINOLONE 100000-0.1 UNIT/GM-% EX CREA: 1.0000 "application " | TOPICAL_CREAM | Freq: Two times a day (BID) | CUTANEOUS | 0 refills | Status: DC

## 2016-10-16 NOTE — Patient Instructions (Addendum)
1. Unable to complete Pap smear and vulvar biopsy today because of mental disability with special needs. 2. Nystatin/triamcinolone cream topically twice daily to the vulva and uterine for management of Monilia vulvitis 3. Recommend patient to undergo exam under anesthesia with Pap smear and vulvar biopsies during one of her two scheduled hospitalizations- for eye injection [facility] hospital) or dental extractions ([facility])

## 2016-10-16 NOTE — Progress Notes (Signed)
GYN ENCOUNTER NOTE  Subjective:       Carly Collins is a 61 y.o. G0P0000 female is here for gynecologic evaluation of the following issues:  1. Pap smear.     61 year old P0 female, menopausal, on no estrogen replacement therapy, resident at [facility], presents for pelvic exam and Pap smear.  Patient has history of breast cancer, followed at [facility].  The patient has active health issues that are requiring Hospital services. The patient is having to get eye injections at [facility]. The patient is having to get teeth pulled at [facility].  Today the patient presents with vulvar itching symptoms. She is not experiencing any abnormal uterine bleeding.  Gynecologic History No LMP recorded. Patient is postmenopausal.  Obstetric History OB History  Gravida Para Term Preterm AB Living  0 0 0 0 0 0  SAB TAB Ectopic Multiple Live Births  0 0 0 0 0        Past Medical History:  Diagnosis Date  . Anxiety   . C. difficile enteritis   . Cancer ([place])   . Depression   . Diabetes mellitus without complication ([place])   . GERD (gastroesophageal reflux disease)   . Headache   . Hyperlipidemia   . Hypertension   . Mental retardation, mild (I.Q. 50-70)   . Moderate intellectual disabilities   . Obesity   . Recurrent UTI   . Tibial fracture   . Vitamin D deficiency     Past Surgical History:  Procedure Laterality Date  . ABDOMINAL SURGERY    . APPENDECTOMY    . BREAST SURGERY    . COLON SURGERY    . COLONOSCOPY N/A 10/26/2014   Procedure: COLONOSCOPY;  Surgeon: Lollie Sails, MD;  Location: [facility] ENDOSCOPY;  Service: Endoscopy;  Laterality: N/A;  . DILATION AND CURETTAGE OF UTERUS    . ESOPHAGOGASTRODUODENOSCOPY (EGD) WITH PROPOFOL N/A 10/26/2014   Procedure: ESOPHAGOGASTRODUODENOSCOPY (EGD) WITH PROPOFOL;  Surgeon: Lollie Sails, MD;  Location: [facility] ENDOSCOPY;  Service: Endoscopy;  Laterality: N/A;  . FRACTURE SURGERY      Current Outpatient  Prescriptions on File Prior to Visit  Medication Sig Dispense Refill  . amLODipine (NORVASC) 5 MG tablet Take 5 mg by mouth daily.    . Cholecalciferol (VITAMIN D3) 2000 UNITS TABS Take 2,000 Units by mouth once.    . clonazePAM (KLONOPIN) 0.5 MG tablet Take 0.5 mg by mouth at bedtime.    . divalproex (DEPAKOTE) 250 MG DR tablet Take 250 mg by mouth 2 (two) times daily.    Marland Kitchen docusate sodium (COLACE) 100 MG capsule Take 100 mg by mouth 2 (two) times daily.    . ferrous gluconate (FERGON) 240 (27 FE) MG tablet Take 240 mg by mouth 3 (three) times daily with meals.    . folic acid (FOLVITE) 010 MCG tablet Take 400 mcg by mouth daily.    . Infant Care Products (DERMACLOUD) CREA Apply topically once.    Marland Kitchen ketoconazole (NIZORAL) 2 % cream Apply 1 application topically daily.    Marland Kitchen letrozole (FEMARA) 2.5 MG tablet Take 2.5 mg by mouth daily.    Marland Kitchen lisinopril (PRINIVIL,ZESTRIL) 20 MG tablet Take 20 mg by mouth daily.    Marland Kitchen nystatin (MYCOSTATIN/NYSTOP) 100000 UNIT/GM POWD Apply topically 2 (two) times daily.    Marland Kitchen omeprazole (PRILOSEC) 20 MG capsule Take 20 mg by mouth daily.    Marland Kitchen oxycodone (OXY-IR) 5 MG capsule Take 5 mg by mouth every 4 (four) hours  as needed.    Marland Kitchen PARoxetine (PAXIL) 20 MG tablet Take 20 mg by mouth daily.    . pravastatin (PRAVACHOL) 40 MG tablet Take 40 mg by mouth daily.    . QUEtiapine (SEROQUEL) 100 MG tablet Take 100 mg by mouth at bedtime.    . sitaGLIPtin (JANUVIA) 100 MG tablet Take 100 mg by mouth daily.    . traZODone (DESYREL) 50 MG tablet Take 50 mg by mouth at bedtime.     No current facility-administered medications on file prior to visit.     Allergies  Allergen Reactions  . Actos [Pioglitazone]     unknown    Social History   Social History  . Marital status: Single    Spouse name: N/A  . Number of children: N/A  . Years of education: N/A   Occupational History  . Not on file.   Social History Main Topics  . Smoking status: Never Smoker  . Smokeless  tobacco: Never Used  . Alcohol use No  . Drug use: No  . Sexual activity: Not Currently   Other Topics Concern  . Not on file   Social History Narrative  . No narrative on file    History reviewed. No pertinent family history.  The following portions of the patient's history were reviewed and updated as appropriate: allergies, current medications, past family history, past medical history, past social history, past surgical history and problem list.  Review of Systems As noted per the history of present illness Vulvar itching Urinary incontinence No abnormal uterine bleeding Unknown last Pap smear  Objective:   BP 121/64   Pulse 73   Ht 5\' 7"  (1.702 m)   Wt 162 lb 9.6 oz (73.8 kg)   BMI 25.47 kg/m  CONSTITUTIONAL: Well-developed, well-nourished female in no acute distress. Anxious and minimally cooperative HENT:  Normocephalic, atraumatic.  NECK: Not examined SKIN: Skin is warm and dry. No rash noted. Not diaphoretic. No erythema. No pallor. Level Green: Intellectual disability evidence CARDIOVASCULAR:Not Examined RESPIRATORY: Not Examined BREASTS: Not Examined ABDOMEN: Soft, non distended; Non tender.  No Organomegaly. Moderate pannus PELVIC:  External Genitalia: Monilia rash associated with vulvar hyperemia in satellite lesions extending into the groin; there is leukoplakia as well as thinning of tissues at the posterior fourchette, suspicious for lichen sclerosis  BUS: Normal  Vagina: Not able to be examined  Cervix: Not able to be examined  Uterus: Not able to be examined  Adnexa: Not able to be examined  RV: Normal external exam  Bladder: Not able to be examined MUSCULOSKELETAL: Normal range of motion. No tenderness.  No cyanosis, clubbing, or edema.     Assessment:   1. Encounter for screening for cervical cancer  - Pap IG w/ reflex to HPV when ASC-U  2. Screen for colon cancer - Fecal occult blood, imunochemical  3. Monilia vulvitis  4. Vulvar  leukoplakia-posterior fourchette, suspicious for lichen sclerosis  5. Mental disability, inability to complete Pap smear and vulvar biopsies     Plan:   1. Pap smear needed 2. Vulvar biopsy needed 3. Testing will need to be performed during exam under anesthesia 4. Recommend [facility] gynecology perform Pap smear and biopsies during exam under anesthesia at the same time that patient is being admitted to [facility] same day surgery for dental extractions. Alternatively it may also be performed during same day surgery at [facility] when she is to have eye injections. Performing the procedures at either of these facilities  will limit the need for another hospitalization/anesthesia exposure  at [facility].  Brayton Mars, MD  Note: This dictation was prepared with Dragon dictation along with smaller phrase technology. Any transcriptional errors that result from this process are unintentional.

## 2016-10-30 DIAGNOSIS — F419 Anxiety disorder, unspecified: Secondary | ICD-10-CM | POA: Diagnosis not present

## 2016-10-30 DIAGNOSIS — Z794 Long term (current) use of insulin: Secondary | ICD-10-CM | POA: Diagnosis not present

## 2016-10-30 DIAGNOSIS — I10 Essential (primary) hypertension: Secondary | ICD-10-CM | POA: Diagnosis not present

## 2016-10-30 DIAGNOSIS — E113311 Type 2 diabetes mellitus with moderate nonproliferative diabetic retinopathy with macular edema, right eye: Secondary | ICD-10-CM | POA: Diagnosis not present

## 2016-10-30 DIAGNOSIS — K219 Gastro-esophageal reflux disease without esophagitis: Secondary | ICD-10-CM | POA: Diagnosis not present

## 2016-10-30 DIAGNOSIS — Z79899 Other long term (current) drug therapy: Secondary | ICD-10-CM | POA: Diagnosis not present

## 2016-10-30 DIAGNOSIS — R6250 Unspecified lack of expected normal physiological development in childhood: Secondary | ICD-10-CM | POA: Diagnosis not present

## 2016-10-30 DIAGNOSIS — F329 Major depressive disorder, single episode, unspecified: Secondary | ICD-10-CM | POA: Diagnosis not present

## 2016-10-30 DIAGNOSIS — K029 Dental caries, unspecified: Secondary | ICD-10-CM | POA: Diagnosis not present

## 2016-10-30 DIAGNOSIS — E785 Hyperlipidemia, unspecified: Secondary | ICD-10-CM | POA: Diagnosis not present

## 2016-11-07 DIAGNOSIS — E669 Obesity, unspecified: Secondary | ICD-10-CM | POA: Diagnosis not present

## 2016-11-07 DIAGNOSIS — G4700 Insomnia, unspecified: Secondary | ICD-10-CM | POA: Diagnosis not present

## 2016-11-07 DIAGNOSIS — E785 Hyperlipidemia, unspecified: Secondary | ICD-10-CM | POA: Diagnosis not present

## 2016-11-07 DIAGNOSIS — F329 Major depressive disorder, single episode, unspecified: Secondary | ICD-10-CM | POA: Diagnosis not present

## 2016-11-07 DIAGNOSIS — E119 Type 2 diabetes mellitus without complications: Secondary | ICD-10-CM | POA: Diagnosis not present

## 2016-11-07 DIAGNOSIS — F322 Major depressive disorder, single episode, severe without psychotic features: Secondary | ICD-10-CM | POA: Diagnosis not present

## 2016-11-07 DIAGNOSIS — C50919 Malignant neoplasm of unspecified site of unspecified female breast: Secondary | ICD-10-CM | POA: Diagnosis not present

## 2016-11-07 DIAGNOSIS — E538 Deficiency of other specified B group vitamins: Secondary | ICD-10-CM | POA: Diagnosis not present

## 2016-11-07 DIAGNOSIS — K029 Dental caries, unspecified: Secondary | ICD-10-CM | POA: Diagnosis not present

## 2016-11-07 DIAGNOSIS — K219 Gastro-esophageal reflux disease without esophagitis: Secondary | ICD-10-CM | POA: Diagnosis not present

## 2016-11-07 DIAGNOSIS — F419 Anxiety disorder, unspecified: Secondary | ICD-10-CM | POA: Diagnosis not present

## 2016-11-07 DIAGNOSIS — F819 Developmental disorder of scholastic skills, unspecified: Secondary | ICD-10-CM | POA: Diagnosis not present

## 2016-11-07 DIAGNOSIS — I10 Essential (primary) hypertension: Secondary | ICD-10-CM | POA: Diagnosis not present

## 2016-11-07 DIAGNOSIS — Z79811 Long term (current) use of aromatase inhibitors: Secondary | ICD-10-CM | POA: Diagnosis not present

## 2016-11-07 DIAGNOSIS — K0510 Chronic gingivitis, plaque induced: Secondary | ICD-10-CM | POA: Diagnosis not present

## 2016-11-18 DIAGNOSIS — Z1211 Encounter for screening for malignant neoplasm of colon: Secondary | ICD-10-CM | POA: Diagnosis not present

## 2016-11-21 LAB — FECAL OCCULT BLOOD, IMMUNOCHEMICAL: Fecal Occult Bld: NEGATIVE

## 2016-12-11 DIAGNOSIS — F419 Anxiety disorder, unspecified: Secondary | ICD-10-CM | POA: Diagnosis not present

## 2016-12-11 DIAGNOSIS — E113393 Type 2 diabetes mellitus with moderate nonproliferative diabetic retinopathy without macular edema, bilateral: Secondary | ICD-10-CM | POA: Diagnosis not present

## 2016-12-11 DIAGNOSIS — K219 Gastro-esophageal reflux disease without esophagitis: Secondary | ICD-10-CM | POA: Diagnosis not present

## 2016-12-11 DIAGNOSIS — E113311 Type 2 diabetes mellitus with moderate nonproliferative diabetic retinopathy with macular edema, right eye: Secondary | ICD-10-CM | POA: Diagnosis not present

## 2016-12-11 DIAGNOSIS — I10 Essential (primary) hypertension: Secondary | ICD-10-CM | POA: Diagnosis not present

## 2016-12-11 DIAGNOSIS — Z853 Personal history of malignant neoplasm of breast: Secondary | ICD-10-CM | POA: Diagnosis not present

## 2016-12-11 DIAGNOSIS — Z79811 Long term (current) use of aromatase inhibitors: Secondary | ICD-10-CM | POA: Diagnosis not present

## 2016-12-11 DIAGNOSIS — Z794 Long term (current) use of insulin: Secondary | ICD-10-CM | POA: Diagnosis not present

## 2016-12-11 DIAGNOSIS — F329 Major depressive disorder, single episode, unspecified: Secondary | ICD-10-CM | POA: Diagnosis not present

## 2016-12-11 DIAGNOSIS — Z79899 Other long term (current) drug therapy: Secondary | ICD-10-CM | POA: Diagnosis not present

## 2016-12-11 DIAGNOSIS — E11311 Type 2 diabetes mellitus with unspecified diabetic retinopathy with macular edema: Secondary | ICD-10-CM | POA: Diagnosis not present

## 2016-12-23 ENCOUNTER — Ambulatory Visit: Payer: Medicare Other | Admitting: Podiatry

## 2017-01-07 DIAGNOSIS — E113311 Type 2 diabetes mellitus with moderate nonproliferative diabetic retinopathy with macular edema, right eye: Secondary | ICD-10-CM | POA: Diagnosis not present

## 2017-01-23 ENCOUNTER — Other Ambulatory Visit: Payer: Self-pay

## 2017-01-23 MED ORDER — NYSTATIN-TRIAMCINOLONE 100000-0.1 UNIT/GM-% EX CREA: 1.0000 "application " | TOPICAL_CREAM | Freq: Two times a day (BID) | CUTANEOUS | 0 refills | Status: DC

## 2017-02-11 DIAGNOSIS — F411 Generalized anxiety disorder: Secondary | ICD-10-CM | POA: Diagnosis not present

## 2017-02-24 DIAGNOSIS — E113219 Type 2 diabetes mellitus with mild nonproliferative diabetic retinopathy with macular edema, unspecified eye: Secondary | ICD-10-CM | POA: Diagnosis not present

## 2017-02-24 DIAGNOSIS — Z1159 Encounter for screening for other viral diseases: Secondary | ICD-10-CM | POA: Diagnosis not present

## 2017-02-24 DIAGNOSIS — E1169 Type 2 diabetes mellitus with other specified complication: Secondary | ICD-10-CM | POA: Diagnosis not present

## 2017-02-24 DIAGNOSIS — E559 Vitamin D deficiency, unspecified: Secondary | ICD-10-CM | POA: Diagnosis not present

## 2017-02-24 DIAGNOSIS — Z794 Long term (current) use of insulin: Secondary | ICD-10-CM | POA: Diagnosis not present

## 2017-02-24 DIAGNOSIS — I10 Essential (primary) hypertension: Secondary | ICD-10-CM | POA: Diagnosis not present

## 2017-03-10 ENCOUNTER — Ambulatory Visit
Admission: EM | Admit: 2017-03-10 | Discharge: 2017-03-10 | Disposition: A | Discharge status: Home / Self Care | Payer: Medicare Other | Attending: Family Medicine | Admitting: Family Medicine

## 2017-03-10 ENCOUNTER — Encounter: Payer: Self-pay | Admitting: Emergency Medicine

## 2017-03-10 DIAGNOSIS — R0981 Nasal congestion: Secondary | ICD-10-CM | POA: Insufficient documentation

## 2017-03-10 DIAGNOSIS — A0472 Enterocolitis due to Clostridium difficile, not specified as recurrent: Secondary | ICD-10-CM | POA: Insufficient documentation

## 2017-03-10 DIAGNOSIS — F329 Major depressive disorder, single episode, unspecified: Secondary | ICD-10-CM | POA: Insufficient documentation

## 2017-03-10 DIAGNOSIS — E669 Obesity, unspecified: Secondary | ICD-10-CM | POA: Insufficient documentation

## 2017-03-10 DIAGNOSIS — F419 Anxiety disorder, unspecified: Secondary | ICD-10-CM | POA: Diagnosis not present

## 2017-03-10 DIAGNOSIS — E119 Type 2 diabetes mellitus without complications: Secondary | ICD-10-CM | POA: Diagnosis not present

## 2017-03-10 DIAGNOSIS — R05 Cough: Secondary | ICD-10-CM | POA: Diagnosis not present

## 2017-03-10 DIAGNOSIS — J029 Acute pharyngitis, unspecified: Secondary | ICD-10-CM | POA: Diagnosis not present

## 2017-03-10 DIAGNOSIS — Z794 Long term (current) use of insulin: Secondary | ICD-10-CM | POA: Insufficient documentation

## 2017-03-10 DIAGNOSIS — E559 Vitamin D deficiency, unspecified: Secondary | ICD-10-CM | POA: Insufficient documentation

## 2017-03-10 DIAGNOSIS — I10 Essential (primary) hypertension: Secondary | ICD-10-CM | POA: Diagnosis not present

## 2017-03-10 DIAGNOSIS — E785 Hyperlipidemia, unspecified: Secondary | ICD-10-CM | POA: Insufficient documentation

## 2017-03-10 DIAGNOSIS — B9789 Other viral agents as the cause of diseases classified elsewhere: Secondary | ICD-10-CM | POA: Insufficient documentation

## 2017-03-10 DIAGNOSIS — Z79899 Other long term (current) drug therapy: Secondary | ICD-10-CM | POA: Diagnosis not present

## 2017-03-10 DIAGNOSIS — K219 Gastro-esophageal reflux disease without esophagitis: Secondary | ICD-10-CM | POA: Insufficient documentation

## 2017-03-10 LAB — RAPID STREP SCREEN (MED CTR MEBANE ONLY): Streptococcus, Group A Screen (Direct): NEGATIVE

## 2017-03-10 NOTE — ED Triage Notes (Signed)
Patient c/o cough, sore throat and congestion that started Thursday.  Patient denies fevers.

## 2017-03-10 NOTE — ED Provider Notes (Signed)
[facility]    CSN: [id number] Arrival date & time: 03/10/17  1225     History   Chief Complaint Chief Complaint  Patient presents with  . Cough  . Sore Throat    HPI Carly Collins is a 61 y.o. female.   HPI  This a 61 year old female with a history of cognitive deficits accompanied by one of her case managers presents with a sore throat cough and congestion that started on Thursday 4 days prior to this visit. Today she has a mild case of laryngitis. She denies any fever or chills. O2 sats are 100% on room air temperature is 98.1. She does have  diabetes mellitus and GERD.         Past Medical History:  Diagnosis Date  . Anxiety   . C. difficile enteritis   . Cancer ([place])   . Depression   . Diabetes mellitus without complication ([place])   . GERD (gastroesophageal reflux disease)   . Headache   . Hyperlipidemia   . Hypertension   . Mental retardation, mild (I.Q. 50-70)   . Moderate intellectual disabilities   . Obesity   . Recurrent UTI   . Tibial fracture   . Vitamin D deficiency     Patient Active Problem List   Diagnosis Date Noted  . Anxiety and depression 10/16/2016  . GERD (gastroesophageal reflux disease) 10/16/2016  . Closed fracture of distal end of left humerus 06/08/2014  . Headache 11/30/2013  . Breast cancer ([place]) 04/18/2013  . Closed fracture of condyle of femur ([place]) 10/05/2012  . Closed fracture of proximal tibia 10/05/2012  . Cognitive deficits 10/05/2012  . Diabetes mellitus ([place]) 10/05/2012    Past Surgical History:  Procedure Laterality Date  . ABDOMINAL SURGERY    . APPENDECTOMY    . BREAST SURGERY    . COLON SURGERY    . COLONOSCOPY N/A 10/26/2014   Procedure: COLONOSCOPY;  Surgeon: Lollie Sails, MD;  Location: [facility] ENDOSCOPY;  Service: Endoscopy;  Laterality: N/A;  . DILATION AND CURETTAGE OF UTERUS    . ESOPHAGOGASTRODUODENOSCOPY (EGD) WITH PROPOFOL N/A 10/26/2014   Procedure: ESOPHAGOGASTRODUODENOSCOPY (EGD)  WITH PROPOFOL;  Surgeon: Lollie Sails, MD;  Location: [facility] ENDOSCOPY;  Service: Endoscopy;  Laterality: N/A;  . FRACTURE SURGERY      OB History    Gravida Para Term Preterm AB Living   0 0 0 0 0 0   SAB TAB Ectopic Multiple Live Births   0 0 0 0 0       Home Medications    Prior to Admission medications   Medication Sig Start Date End Date Taking? Authorizing Provider  Insulin Detemir (LEVEMIR FLEXPEN [place]) Inject 18 Units into the skin daily.   Yes [provider]  acetaminophen (MAPAP) 500 MG tablet GIVE 2 TABLETS (1000 MG) BY MOUTH 3 TIMES DAILY FOR DISCOMFORT/PAIN 07/01/16   [provider]  antiseptic oral rinse (BIOTENE) LIQD 15 mLs by Mouth Rinse route as needed for dry mouth.    [provider]  Cholecalciferol (VITAMIN D3) 2000 UNITS TABS Take 2,000 Units by mouth once.    [provider]  clonazePAM (KLONOPIN) 0.5 MG tablet Take 0.5 mg by mouth at bedtime.    [provider]  divalproex (DEPAKOTE) 250 MG DR tablet Take 250 mg by mouth 2 (two) times daily.    [provider]  docusate sodium (COLACE) 100 MG capsule Take 100 mg by mouth 2 (two) times daily.  [provider]  DULoxetine (CYMBALTA) 30 MG capsule Take by mouth.    [provider]  ferrous gluconate (FERGON) 240 (27 FE) MG tablet Take 240 mg by mouth 3 (three) times daily with meals.    [provider]  folic acid (FOLVITE) 662 MCG tablet Take 400 mcg by mouth daily.    [provider]  letrozole (FEMARA) 2.5 MG tablet Take 2.5 mg by mouth daily.    [provider]  lisinopril (PRINIVIL,ZESTRIL) 5 MG tablet Take 5 mg by mouth daily.    [provider]  Menthol, Topical Analgesic, 4 % GEL Apply 1 application topically 3 (three) times a day. 10/10/16   [provider]  nystatin (MYCOSTATIN/NYSTOP) 100000 UNIT/GM POWD Apply topically 2 (two) times daily.    [provider]    nystatin-triamcinolone (MYCOLOG II) cream Apply 1 application topically 2 (two) times daily. 01/23/17   Defrancesco, Alanda Slim, MD  pantoprazole (PROTONIX) 40 MG tablet Take by mouth.    [provider]  PARoxetine (PAXIL) 20 MG tablet Take 20 mg by mouth daily.    [provider]  polyethylene glycol (MIRALAX / GLYCOLAX) packet Take by mouth.    [provider]  QUEtiapine (SEROQUEL) 100 MG tablet Take 100 mg by mouth at bedtime.    [provider]  QUEtiapine (SEROQUEL) 300 MG tablet Take 300 mg by mouth at bedtime.    [provider]  sitaGLIPtin (JANUVIA) 100 MG tablet Take 100 mg by mouth daily.    [provider]  traZODone (DESYREL) 50 MG tablet Take 50 mg by mouth at bedtime.    [provider]    Family History History reviewed. No pertinent family history.  Social History Social History  Substance Use Topics  . Smoking status: Never Smoker  . Smokeless tobacco: Never Used  . Alcohol use No     Allergies   Actos [pioglitazone]   Review of Systems Review of Systems  Constitutional: Positive for activity change. Negative for appetite change, chills, fatigue and fever.  HENT: Positive for congestion, sore throat and voice change.   Respiratory: Positive for cough. Negative for shortness of breath, wheezing and stridor.   All other systems reviewed and are negative.    Physical Exam Triage Vital Signs ED Triage Vitals  Enc Vitals Group     BP 03/10/17 1316 121/88     Pulse Rate 03/10/17 1316 92     Resp 03/10/17 1316 16     Temp 03/10/17 1316 98.1 F (36.7 C)     Temp Source 03/10/17 1316 Oral     SpO2 03/10/17 1316 100 %     Weight 03/10/17 1312 154 lb 5.2 oz (70 kg)     Height 03/10/17 1312 5\' 5"  (1.651 m)     Head Circumference --      Peak Flow --      Pain Score 03/10/17 1312 3     Pain Loc --      Pain Edu? --      Excl. in [place]? --    No data found.   Updated Vital Signs BP 121/88 (BP  Location: Left Arm)   Pulse 92   Temp 98.1 F (36.7 C) (Oral)   Resp 16   Ht 5\' 5"  (1.651 m)   Wt 154 lb 5.2 oz (70 kg)   SpO2 100%   BMI 25.68 kg/m   Visual Acuity Right Eye Distance:   Left Eye Distance:  Bilateral Distance:    Right Eye Near:   Left Eye Near:    Bilateral Near:     Physical Exam  Constitutional: She is oriented to person, place, and time. She appears well-developed and well-nourished. No distress.  HENT:  Head: Normocephalic.  Right Ear: External ear normal.  Left Ear: External ear normal.  Nose: Nose normal.  Mouth/Throat: Oropharynx is clear and moist. No oropharyngeal exudate.  Eyes: Pupils are equal, round, and reactive to light. Right eye exhibits no discharge. Left eye exhibits no discharge.  Neck: Normal range of motion.  Pulmonary/Chest: Effort normal and breath sounds normal.  Musculoskeletal: Normal range of motion.  Lymphadenopathy:    She has no cervical adenopathy.  Neurological: She is alert and oriented to person, place, and time.  Skin: Skin is warm and dry. She is not diaphoretic.  Psychiatric: She has a normal mood and affect. Her behavior is normal. Judgment and thought content normal.  Nursing note and vitals reviewed.    UC Treatments / Results  Labs (all labs ordered are listed, but only abnormal results are displayed) Labs Reviewed  RAPID STREP SCREEN (NOT AT [facility])  CULTURE, GROUP A STREP [facility])    EKG  EKG Interpretation None       Radiology No results found.  Procedures Procedures (including critical care time)  Medications Ordered in UC Medications - No data to display   Initial Impression / Assessment and Plan / UC Course  I have reviewed the triage vital signs and the nursing notes.  Pertinent labs & imaging results that were available during my care of the patient were reviewed by me and considered in my medical decision making (see chart for details).     Plan: 1. Test/x-ray results and  diagnosis reviewed with patient 2. rx as per orders; risks, benefits, potential side effects reviewed with patient 3. Recommend supportive treatment with septic spray to control her pain. Motrin 4 mg 3 times daily will also help with throat pain. Follow-up with primary care physician if not improving she has increasing coughing or high fevers 4. F/u prn if symptoms worsen or don't improve   Final Clinical Impressions(s) / UC Diagnoses   Final diagnoses:  Viral pharyngitis    New Prescriptions New Prescriptions   No medications on file     Controlled Substance Prescriptions [place] Controlled Substance Registry consulted? Not Applicable   Lorin Picket, PA-C 03/10/17 1429

## 2017-03-10 NOTE — Discharge Instructions (Signed)
Use Chloraseptic Spray for throat pain. Also use Motrin 400 mg 3 times daily.

## 2017-03-11 DIAGNOSIS — E113311 Type 2 diabetes mellitus with moderate nonproliferative diabetic retinopathy with macular edema, right eye: Secondary | ICD-10-CM | POA: Diagnosis not present

## 2017-03-12 LAB — CULTURE, GROUP A STREP (THRC)

## 2017-03-18 DIAGNOSIS — F411 Generalized anxiety disorder: Secondary | ICD-10-CM | POA: Diagnosis not present

## 2017-03-20 DIAGNOSIS — E113293 Type 2 diabetes mellitus with mild nonproliferative diabetic retinopathy without macular edema, bilateral: Secondary | ICD-10-CM | POA: Diagnosis not present

## 2017-03-20 DIAGNOSIS — Z794 Long term (current) use of insulin: Secondary | ICD-10-CM | POA: Diagnosis not present

## 2017-03-20 DIAGNOSIS — M25562 Pain in left knee: Secondary | ICD-10-CM | POA: Diagnosis not present

## 2017-03-20 DIAGNOSIS — Z23 Encounter for immunization: Secondary | ICD-10-CM | POA: Diagnosis not present

## 2017-03-20 DIAGNOSIS — I10 Essential (primary) hypertension: Secondary | ICD-10-CM | POA: Diagnosis not present

## 2017-03-20 DIAGNOSIS — D509 Iron deficiency anemia, unspecified: Secondary | ICD-10-CM | POA: Diagnosis not present

## 2017-06-17 ENCOUNTER — Encounter: Payer: Self-pay | Admitting: *Deleted

## 2017-06-17 ENCOUNTER — Ambulatory Visit
Admission: EM | Admit: 2017-06-17 | Discharge: 2017-06-17 | Disposition: A | Discharge status: Home / Self Care | Payer: Medicare Other | Attending: Family Medicine | Admitting: Family Medicine

## 2017-06-17 DIAGNOSIS — L304 Erythema intertrigo: Secondary | ICD-10-CM

## 2017-06-17 MED ORDER — CLOTRIMAZOLE 1 % EX CREA: TOPICAL_CREAM | CUTANEOUS | 0 refills | Status: DC

## 2017-06-17 MED ORDER — FLUCONAZOLE 150 MG PO TABS: 150.0000 mg | ORAL_TABLET | ORAL | 0 refills | Status: AC

## 2017-06-17 NOTE — ED Provider Notes (Signed)
[facility]    CSN: [id number] Arrival date & time: 06/17/17  1627   History   Chief Complaint Chief Complaint  Patient presents with  . Rash   HPI  62 year old female presents with rash.  History obtained by the family member as she has mental disability and cognitive deficits.  Family member states that she was called by her group home today reporting rash.  Rash is located on the lower abdomen underneath her pannus.  Patient states that it is painful.  Family member states that she received a picture of it and its red and looks quite bad.  No fevers or chills.  No other associated symptoms that she is aware of.  No other complaints at this time.  Past Medical History:  Diagnosis Date  . Anxiety   . C. difficile enteritis   . Cancer ([place])   . Depression   . Diabetes mellitus without complication ([place])   . GERD (gastroesophageal reflux disease)   . Headache   . Hyperlipidemia   . Hypertension   . Mental retardation, mild (I.Q. 50-70)   . Moderate intellectual disabilities   . Obesity   . Recurrent UTI   . Tibial fracture   . Vitamin D deficiency    Patient Active Problem List   Diagnosis Date Noted  . Anxiety and depression 10/16/2016  . GERD (gastroesophageal reflux disease) 10/16/2016  . Closed fracture of distal end of left humerus 06/08/2014  . Headache 11/30/2013  . Breast cancer ([place]) 04/18/2013  . Closed fracture of condyle of femur ([place]) 10/05/2012  . Closed fracture of proximal tibia 10/05/2012  . Cognitive deficits 10/05/2012  . Diabetes mellitus ([place]) 10/05/2012   Past Surgical History:  Procedure Laterality Date  . ABDOMINAL SURGERY    . APPENDECTOMY    . BREAST SURGERY    . COLON SURGERY    . COLONOSCOPY N/A 10/26/2014   Procedure: COLONOSCOPY;  Surgeon: Lollie Sails, MD;  Location: [facility] ENDOSCOPY;  Service: Endoscopy;  Laterality: N/A;  . DILATION AND CURETTAGE OF UTERUS    . ESOPHAGOGASTRODUODENOSCOPY (EGD) WITH PROPOFOL N/A  10/26/2014   Procedure: ESOPHAGOGASTRODUODENOSCOPY (EGD) WITH PROPOFOL;  Surgeon: Lollie Sails, MD;  Location: [facility] ENDOSCOPY;  Service: Endoscopy;  Laterality: N/A;  . FRACTURE SURGERY     OB History    Gravida Para Term Preterm AB Living   0 0 0 0 0 0   SAB TAB Ectopic Multiple Live Births   0 0 0 0 0     Home Medications    Prior to Admission medications   Medication Sig Start Date End Date Taking? Authorizing Provider  acetaminophen (MAPAP) 500 MG tablet GIVE 2 TABLETS (1000 MG) BY MOUTH 3 TIMES DAILY FOR DISCOMFORT/PAIN 07/01/16   [provider]  antiseptic oral rinse (BIOTENE) LIQD 15 mLs by Mouth Rinse route as needed for dry mouth.    [provider]  Cholecalciferol (VITAMIN D3) 2000 UNITS TABS Take 2,000 Units by mouth once.    [provider]  clonazePAM (KLONOPIN) 0.5 MG tablet Take 0.5 mg by mouth at bedtime.    [provider]  clotrimazole (LOTRIMIN) 1 % cream Apply to affected area 2 times daily for 2 - 4 weeks. 06/17/17   Coral Spikes, DO  divalproex (DEPAKOTE) 250 MG DR tablet Take 250 mg by mouth 2 (two) times daily.    [provider]  docusate sodium (COLACE) 100 MG capsule Take 100 mg by mouth 2 (two) times  daily.    [provider]  DULoxetine (CYMBALTA) 30 MG capsule Take by mouth.    [provider]  ferrous gluconate (FERGON) 240 (27 FE) MG tablet Take 240 mg by mouth 3 (three) times daily with meals.    [provider]  fluconazole (DIFLUCAN) 150 MG tablet Take 1 tablet (150 mg total) by mouth once a week for 4 doses. 06/17/17 07/09/17  Coral Spikes, DO  folic acid (FOLVITE) 297 MCG tablet Take 400 mcg by mouth daily.    [provider]  Insulin Detemir (LEVEMIR FLEXPEN [place]) Inject 18 Units into the skin daily.    [provider]  letrozole (FEMARA) 2.5 MG tablet Take 2.5 mg by mouth daily.    [provider]  lisinopril (PRINIVIL,ZESTRIL) 5 MG tablet Take 5 mg by  mouth daily.    [provider]  Menthol, Topical Analgesic, 4 % GEL Apply 1 application topically 3 (three) times a day. 10/10/16   [provider]  nystatin (MYCOSTATIN/NYSTOP) 100000 UNIT/GM POWD Apply topically 2 (two) times daily.    [provider]  nystatin-triamcinolone (MYCOLOG II) cream Apply 1 application topically 2 (two) times daily. 01/23/17   Defrancesco, Alanda Slim, MD  pantoprazole (PROTONIX) 40 MG tablet Take by mouth.    [provider]  PARoxetine (PAXIL) 20 MG tablet Take 20 mg by mouth daily.    [provider]  polyethylene glycol (MIRALAX / GLYCOLAX) packet Take by mouth.    [provider]  QUEtiapine (SEROQUEL) 100 MG tablet Take 100 mg by mouth at bedtime.    [provider]  QUEtiapine (SEROQUEL) 300 MG tablet Take 300 mg by mouth at bedtime.    [provider]  sitaGLIPtin (JANUVIA) 100 MG tablet Take 100 mg by mouth daily.    [provider]  traZODone (DESYREL) 50 MG tablet Take 50 mg by mouth at bedtime.    [provider]   Family History Family History  Problem Relation Age of Onset  . Other Mother   . Other Father    Social History Social History   Tobacco Use  . Smoking status: Never Smoker  . Smokeless tobacco: Never Used  Substance Use Topics  . Alcohol use: No  . Drug use: No   Allergies   Actos [pioglitazone]   Review of Systems Review of Systems  Constitutional: Negative.   Skin: Positive for rash.   Physical Exam Triage Vital Signs ED Triage Vitals  Enc Vitals Group     BP 06/17/17 1703 114/62     Pulse Rate 06/17/17 1703 80     Resp 06/17/17 1703 16     Temp 06/17/17 1703 (!) 97.3 F (36.3 C)     Temp Source 06/17/17 1703 Oral     SpO2 06/17/17 1703 97 %     Weight 06/17/17 1706 152 lb (68.9 kg)     Height 06/17/17 1706 5\' 6"  (1.676 m)     Head Circumference --      Peak Flow --      Pain Score 06/17/17 1903 0     Pain Loc --      Pain  Edu? --      Excl. in [place]? --    Updated Vital Signs BP 114/62 (BP Location: Left Arm)   Pulse 80   Temp (!) 97.3 F (36.3 C) (Oral)   Resp 16   Ht 5\' 6"  (1.676 m)   Wt 152 lb (68.9 kg)  SpO2 97%   BMI 24.53 kg/m   Physical Exam  Constitutional: She is oriented to person, place, and time. She appears well-developed. No distress.  HENT:  Head: Normocephalic and atraumatic.  Pulmonary/Chest: Effort normal. No respiratory distress.  Neurological: She is alert and oriented to person, place, and time.  Skin:  Erythematous, warm rash noted underneath the left side of her pannus.  Psychiatric: She has a normal mood and affect. Her behavior is normal.  Nursing note and vitals reviewed.  UC Treatments / Results  Labs (all labs ordered are listed, but only abnormal results are displayed) Labs Reviewed - No data to display  EKG  EKG Interpretation None       Radiology No results found.  Procedures Procedures (including critical care time)  Medications Ordered in UC Medications - No data to display   Initial Impression / Assessment and Plan / UC Course  I have reviewed the triage vital signs and the nursing notes.  Pertinent labs & imaging results that were available during my care of the patient were reviewed by me and considered in my medical decision making (see chart for details).    62 year old female presents with intertrigo. Treating with diflucan and clotrimazole.   Final Clinical Impressions(s) / UC Diagnoses   Final diagnoses:  Intertrigo    ED Discharge Orders        Ordered    fluconazole (DIFLUCAN) 150 MG tablet  Weekly     06/17/17 1902    clotrimazole (LOTRIMIN) 1 % cream     06/17/17 1902     Controlled Substance Prescriptions [place] Controlled Substance Registry consulted? Not Applicable   Coral Spikes, DO 06/17/17 1952

## 2017-06-17 NOTE — ED Triage Notes (Signed)
Abdominal rash since yesterday.

## 2017-07-25 ENCOUNTER — Ambulatory Visit
Admission: EM | Admit: 2017-07-25 | Discharge: 2017-07-25 | Disposition: A | Discharge status: Home / Self Care | Payer: Medicare Other | Attending: Family Medicine | Admitting: Family Medicine

## 2017-07-25 ENCOUNTER — Encounter: Payer: Self-pay | Admitting: *Deleted

## 2017-07-25 DIAGNOSIS — Z79899 Other long term (current) drug therapy: Secondary | ICD-10-CM | POA: Diagnosis not present

## 2017-07-25 DIAGNOSIS — F419 Anxiety disorder, unspecified: Secondary | ICD-10-CM | POA: Insufficient documentation

## 2017-07-25 DIAGNOSIS — I10 Essential (primary) hypertension: Secondary | ICD-10-CM | POA: Insufficient documentation

## 2017-07-25 DIAGNOSIS — E669 Obesity, unspecified: Secondary | ICD-10-CM | POA: Insufficient documentation

## 2017-07-25 DIAGNOSIS — F329 Major depressive disorder, single episode, unspecified: Secondary | ICD-10-CM | POA: Diagnosis not present

## 2017-07-25 DIAGNOSIS — Z794 Long term (current) use of insulin: Secondary | ICD-10-CM | POA: Diagnosis not present

## 2017-07-25 DIAGNOSIS — R300 Dysuria: Secondary | ICD-10-CM | POA: Diagnosis present

## 2017-07-25 DIAGNOSIS — K219 Gastro-esophageal reflux disease without esophagitis: Secondary | ICD-10-CM | POA: Insufficient documentation

## 2017-07-25 DIAGNOSIS — E785 Hyperlipidemia, unspecified: Secondary | ICD-10-CM | POA: Insufficient documentation

## 2017-07-25 DIAGNOSIS — Z6825 Body mass index (BMI) 25.0-25.9, adult: Secondary | ICD-10-CM | POA: Insufficient documentation

## 2017-07-25 DIAGNOSIS — E559 Vitamin D deficiency, unspecified: Secondary | ICD-10-CM | POA: Insufficient documentation

## 2017-07-25 DIAGNOSIS — E119 Type 2 diabetes mellitus without complications: Secondary | ICD-10-CM | POA: Insufficient documentation

## 2017-07-25 LAB — URINALYSIS, COMPLETE (UACMP) WITH MICROSCOPIC
Bilirubin Urine: NEGATIVE
Glucose, UA: NEGATIVE mg/dL
Hgb urine dipstick: NEGATIVE
Ketones, ur: NEGATIVE mg/dL
Nitrite: NEGATIVE
Protein, ur: NEGATIVE mg/dL
RBC / HPF: NONE SEEN RBC/hpf (ref 0–5)
Specific Gravity, Urine: <1.005 — ABNORMAL LOW (ref 1.005–1.030)
pH: 6 (ref 5.0–8.0)

## 2017-07-25 NOTE — ED Provider Notes (Signed)
[facility]    CSN: [id number] Arrival date & time: 07/25/17  1320     History   Chief Complaint Chief Complaint  Patient presents with  . Dysuria    HPI Carly Collins is a 62 y.o. female.   HPI 62 year old female with cognitive deficits [place] by her caretaker presents with dysuria and malodorous urine that she has had since yesterday.  She has a history of UTIs per the caretaker.  All history is taken from the caretaker.  Evidently patient has had no fever or chills.  Unknown by vaginal discharge or any irritation in the perineal area.  He is alert mid is afebrile today.  Caretaker tells me that the patient is scheduled next week for a Pap smear under anesthesia since she is not cooperative with any procedure.        Past Medical History:  Diagnosis Date  . Anxiety   . C. difficile enteritis   . Cancer ([place])   . Depression   . Diabetes mellitus without complication ([place])   . GERD (gastroesophageal reflux disease)   . Headache   . Hyperlipidemia   . Hypertension   . Mental retardation, mild (I.Q. 50-70)   . Moderate intellectual disabilities   . Obesity   . Recurrent UTI   . Tibial fracture   . Vitamin D deficiency     Patient Active Problem List   Diagnosis Date Noted  . Anxiety and depression 10/16/2016  . GERD (gastroesophageal reflux disease) 10/16/2016  . Closed fracture of distal end of left humerus 06/08/2014  . Headache 11/30/2013  . Breast cancer ([place]) 04/18/2013  . Closed fracture of condyle of femur ([place]) 10/05/2012  . Closed fracture of proximal tibia 10/05/2012  . Cognitive deficits 10/05/2012  . Diabetes mellitus ([place]) 10/05/2012    Past Surgical History:  Procedure Laterality Date  . ABDOMINAL SURGERY    . APPENDECTOMY    . BREAST SURGERY    . COLON SURGERY    . COLONOSCOPY N/A 10/26/2014   Procedure: COLONOSCOPY;  Surgeon: Lollie Sails, MD;  Location: [facility] ENDOSCOPY;  Service: Endoscopy;  Laterality: N/A;  . DILATION  AND CURETTAGE OF UTERUS    . ESOPHAGOGASTRODUODENOSCOPY (EGD) WITH PROPOFOL N/A 10/26/2014   Procedure: ESOPHAGOGASTRODUODENOSCOPY (EGD) WITH PROPOFOL;  Surgeon: Lollie Sails, MD;  Location: [facility] ENDOSCOPY;  Service: Endoscopy;  Laterality: N/A;  . FRACTURE SURGERY      OB History    Gravida Para Term Preterm AB Living   0 0 0 0 0 0   SAB TAB Ectopic Multiple Live Births   0 0 0 0 0       Home Medications    Prior to Admission medications   Medication Sig Start Date End Date Taking? Authorizing Provider  Cholecalciferol (VITAMIN D3) 2000 UNITS TABS Take 2,000 Units by mouth once.   Yes [provider]  clonazePAM (KLONOPIN) 0.5 MG tablet Take 0.5 mg by mouth at bedtime.   Yes [provider]  divalproex (DEPAKOTE) 250 MG DR tablet Take 250 mg by mouth 2 (two) times daily.   Yes [provider]  docusate sodium (COLACE) 100 MG capsule Take 100 mg by mouth 2 (two) times daily.   Yes [provider]  DULoxetine (CYMBALTA) 30 MG capsule Take by mouth.   Yes [provider]  ferrous gluconate (FERGON) 240 (27 FE) MG tablet Take 240 mg by mouth 3 (three) times daily with meals.   Yes [provider]  folic acid (FOLVITE) 810 MCG tablet Take 400 mcg by mouth daily.   Yes [provider]  letrozole (FEMARA) 2.5 MG tablet Take 2.5 mg by mouth daily.   Yes [provider]  lisinopril (PRINIVIL,ZESTRIL) 5 MG tablet Take 5 mg by mouth daily.   Yes [provider]  pantoprazole (PROTONIX) 40 MG tablet Take by mouth.   Yes [provider]  PARoxetine (PAXIL) 20 MG tablet Take 20 mg by mouth daily.   Yes [provider]  polyethylene glycol (MIRALAX / GLYCOLAX) packet Take by mouth.   Yes [provider]  QUEtiapine (SEROQUEL) 100 MG tablet Take 100 mg by mouth at bedtime.   Yes [provider]  QUEtiapine (SEROQUEL) 300 MG tablet Take 300 mg by mouth at bedtime.   Yes [provider]  sitaGLIPtin (JANUVIA) 100 MG tablet Take 100 mg by mouth daily.   Yes [provider]  traZODone (DESYREL) 50 MG tablet Take 50 mg by mouth at bedtime.   Yes [provider]  acetaminophen (MAPAP) 500 MG tablet GIVE 2 TABLETS (1000 MG) BY MOUTH 3 TIMES DAILY FOR DISCOMFORT/PAIN 07/01/16   [provider]  antiseptic oral rinse (BIOTENE) LIQD 15 mLs by Mouth Rinse route as needed for dry mouth.    [provider]  clotrimazole (LOTRIMIN) 1 % cream Apply to affected area 2 times daily for 2 - 4 weeks. 06/17/17   Coral Spikes, DO  Insulin Detemir (LEVEMIR FLEXPEN [place]) Inject 18 Units into the skin daily.    [provider]  Menthol, Topical Analgesic, 4 % GEL Apply 1 application topically 3 (three) times a day. 10/10/16   [provider]  nystatin (MYCOSTATIN/NYSTOP) 100000 UNIT/GM POWD Apply topically 2 (two) times daily.    [provider]  nystatin-triamcinolone (MYCOLOG II) cream Apply 1 application topically 2 (two) times daily. 01/23/17   Defrancesco, Alanda Slim, MD    Family History Family History  Problem Relation Age of Onset  . Other Mother   . Other Father     Social History Social History   Tobacco Use  . Smoking status: Never Smoker  . Smokeless tobacco: Never Used  Substance Use Topics  . Alcohol use: No  . Drug use: No     Allergies   Actos [pioglitazone]   Review of Systems Review of Systems  Constitutional: Positive for activity change. Negative for chills, fatigue and fever.  Genitourinary: Positive for dysuria and frequency.  All other systems reviewed and are negative.    Physical Exam Triage Vital Signs ED Triage Vitals  Enc Vitals Group     BP 07/25/17 1351 (!) 113/50     Pulse Rate 07/25/17 1351 78     Resp 07/25/17 1351 16     Temp 07/25/17 1351 (!) 97.4 F (36.3 C)     Temp Source 07/25/17 1351 Oral     SpO2 07/25/17 1351 100 %     Weight 07/25/17 1354 153 lb (69.4 kg)       Height 07/25/17 1354 5\' 5"  (1.651 m)     Head Circumference --      Peak Flow --      Pain Score --      Pain Loc --      Pain Edu? --      Excl. in [place]? --    No data found.  Updated Vital Signs BP (!) 113/50 (BP Location: Left Arm)   Pulse 78  Temp (!) 97.4 F (36.3 C) (Oral)   Resp 16   Ht 5\' 5"  (1.651 m)   Wt 153 lb (69.4 kg)   SpO2 100%   BMI 25.46 kg/m   Visual Acuity Right Eye Distance:   Left Eye Distance:   Bilateral Distance:    Right Eye Near:   Left Eye Near:    Bilateral Near:     Physical Exam  Constitutional: She appears well-developed and well-nourished. No distress.  HENT:  Head: Normocephalic.  Eyes: Pupils are equal, round, and reactive to light. Right eye exhibits no discharge. Left eye exhibits no discharge.  Neck: Normal range of motion.  Musculoskeletal: Normal range of motion.  Neurological: She is alert.  Skin: Skin is warm and dry. She is not diaphoretic.  Nursing note and vitals reviewed.    UC Treatments / Results  Labs (all labs ordered are listed, but only abnormal results are displayed) Labs Reviewed  URINALYSIS, COMPLETE (UACMP) WITH MICROSCOPIC - Abnormal; Notable for the following components:      Result Value   Specific Gravity, Urine <1.005 (*)    Leukocytes, UA TRACE (*)    Squamous Epithelial / LPF 0-5 (*)    Bacteria, UA FEW (*)    All other components within normal limits  URINE CULTURE    EKG  EKG Interpretation None       Radiology No results found.  Procedures Procedures (including critical care time)  Medications Ordered in UC Medications - No data to display   Initial Impression / Assessment and Plan / UC Course  I have reviewed the triage vital signs and the nursing notes.  Pertinent labs & imaging results that were available during my care of the patient were reviewed by me and considered in my medical decision making (see chart for details).     Plan: 1. Test/x-ray results and  diagnosis reviewed with patient 2. rx as per orders; risks, benefits, potential side effects reviewed with patient 3. Recommend supportive treatment with obtaining a urine culture which will be available in 48 hours.  We will treat if there is any significant growth.  The patient is having a Pap smear under anesthesia, I recommended that wet prep be obtained at the same time to rule out vaginal source for her dysuria.  Her urine analysis today does not support a urinary  tract infection at the present time.  Therefore since she is having a examination under anesthesia it would be much easier to obtain samples at that point.  Note was written to this effect for the caretaker to take back to the group home.  4. F/u prn if symptoms worsen or don't improve   Final Clinical Impressions(s) / UC Diagnoses   Final diagnoses:  Dysuria    ED Discharge Orders    None       Controlled Substance Prescriptions [place] Controlled Substance Registry consulted? Not Applicable   Lorin Picket, PA-C 07/25/17 1454

## 2017-07-25 NOTE — Discharge Instructions (Signed)
Will obtain urine culture which will be available in 48 hours with results.  We will not treat at this time unless the culture is positive.  Recommend patient have a wet prep performed while she is under anesthesia for her Pap smear next week.  This was relayed to the caretaker who accompanies her today

## 2017-07-25 NOTE — ED Triage Notes (Signed)
Dysuria since yesterday. Hx of previous UTIs.

## 2017-07-27 LAB — URINE CULTURE

## 2017-07-31 DIAGNOSIS — F419 Anxiety disorder, unspecified: Secondary | ICD-10-CM | POA: Insufficient documentation

## 2017-12-29 ENCOUNTER — Encounter: Payer: Self-pay | Admitting: Podiatry

## 2017-12-29 ENCOUNTER — Ambulatory Visit (INDEPENDENT_AMBULATORY_CARE_PROVIDER_SITE_OTHER): Payer: Medicare Other | Admitting: Podiatry

## 2017-12-29 DIAGNOSIS — E119 Type 2 diabetes mellitus without complications: Secondary | ICD-10-CM | POA: Diagnosis not present

## 2017-12-29 DIAGNOSIS — L603 Nail dystrophy: Secondary | ICD-10-CM

## 2017-12-29 NOTE — Progress Notes (Signed)
Subjective:     Patient ID: Carly Collins, female   DOB: 11-26-1955, 62 y.o.   MRN: [id number]  HPI this patient presents the office today with long, ingrowing toenails on both feet. She comes from a home that she states in  [place].  This patient is diabetic. She presents the office today for preventative foot care services   Review of Systems     Objective:   Physical Exam GENERAL APPEARANCE: Alert, conversant. Appropriately groomed. No acute distress.  VASCULAR: Pedal pulses are  palpable at  [facility] and PT bilateral.  Capillary refill time is immediate to all digits,  Normal temperature gradient.  Digital hair growth is present bilateral  NEUROLOGIC: sensation is normal to 5.07 monofilament at 5/5 sites bilateral.  Light touch is intact bilateral, Muscle strength normal.  MUSCULOSKELETAL: acceptable muscle strength, tone and stability bilateral.  Intrinsic muscluature intact bilateral.  HAV 1st MPJ  B/L NAILS  Long ingrowing toenails hallux  B/L. DERMATOLOGIC: skin color, texture, and turgor are within normal limits.  No preulcerative lesions or ulcers  are seen, no interdigital maceration noted.  No open lesions present.  Digital nails are elongated No drainage noted.      Assessment:     Onychodystrophy    Plan:     IE  Debride nails  RTC 6 months.  Gardiner Barefoot DPM

## 2018-01-05 ENCOUNTER — Other Ambulatory Visit: Payer: Self-pay

## 2018-01-05 ENCOUNTER — Encounter: Payer: Self-pay | Admitting: Emergency Medicine

## 2018-01-05 ENCOUNTER — Ambulatory Visit
Admission: EM | Admit: 2018-01-05 | Discharge: 2018-01-05 | Disposition: A | Discharge status: Home / Self Care | Payer: Medicare Other | Attending: Family Medicine | Admitting: Family Medicine

## 2018-01-05 DIAGNOSIS — R21 Rash and other nonspecific skin eruption: Secondary | ICD-10-CM

## 2018-01-05 MED ORDER — NYSTATIN-TRIAMCINOLONE 100000-0.1 UNIT/GM-% EX OINT: 1.0000 "application " | TOPICAL_OINTMENT | Freq: Two times a day (BID) | CUTANEOUS | 0 refills | Status: AC

## 2018-01-05 NOTE — ED Triage Notes (Signed)
Pt presents to urgent care for a rash. On her back and and on buttock. Pt denies any itching.

## 2018-01-05 NOTE — Discharge Instructions (Signed)
-  Mycolog: twice a day to affected areas for no more than 10 days -wash current clothing and bedding -keep area clean and dry -follow up with PCP or urgent care should symptoms worsen or not improve

## 2018-01-05 NOTE — ED Provider Notes (Signed)
[facility]    CSN: [id number] Arrival date & time: 01/05/18  1807     History   Chief Complaint Chief Complaint  Patient presents with  . Rash    HPI Carly Collins is a 62 y.o. female.   Patient is a 62 year old female who presents with a facility caretaker who states she was given the patient a shower earlier today and noticed a rash to her buttocks and to her mid back as well.  Patient denies any itching.  Caretaker also reports that there is some spots in her scalp as well.  There are no new medications, detergents, lotions, or soaps being used at the facility.  Nothing has been given so far to treat this.     Past Medical History:  Diagnosis Date  . Anxiety   . C. difficile enteritis   . Cancer ([place])   . Depression   . Diabetes mellitus without complication ([place])   . GERD (gastroesophageal reflux disease)   . Headache   . Hyperlipidemia   . Hypertension   . Mental retardation, mild (I.Q. 50-70)   . Moderate intellectual disabilities   . Obesity   . Recurrent UTI   . Tibial fracture   . Vitamin D deficiency     Patient Active Problem List   Diagnosis Date Noted  . Anxiety and depression 10/16/2016  . GERD (gastroesophageal reflux disease) 10/16/2016  . Closed fracture of distal end of left humerus 06/08/2014  . Headache 11/30/2013  . Breast cancer ([place]) 04/18/2013  . Closed fracture of condyle of femur ([place]) 10/05/2012  . Closed fracture of proximal tibia 10/05/2012  . Cognitive deficits 10/05/2012  . Diabetes mellitus ([place]) 10/05/2012    Past Surgical History:  Procedure Laterality Date  . ABDOMINAL SURGERY    . APPENDECTOMY    . BREAST SURGERY    . COLON SURGERY    . COLONOSCOPY N/A 10/26/2014   Procedure: COLONOSCOPY;  Surgeon: Lollie Sails, MD;  Location: [facility] ENDOSCOPY;  Service: Endoscopy;  Laterality: N/A;  . DILATION AND CURETTAGE OF UTERUS    . ESOPHAGOGASTRODUODENOSCOPY (EGD) WITH PROPOFOL N/A 10/26/2014   Procedure:  ESOPHAGOGASTRODUODENOSCOPY (EGD) WITH PROPOFOL;  Surgeon: Lollie Sails, MD;  Location: [facility] ENDOSCOPY;  Service: Endoscopy;  Laterality: N/A;  . FRACTURE SURGERY      OB History    Gravida  0   Para  0   Term  0   Preterm  0   AB  0   Living  0     SAB  0   TAB  0   Ectopic  0   Multiple  0   Live Births  0            Home Medications    Prior to Admission medications   Medication Sig Start Date End Date Taking? Authorizing Provider  acetaminophen (MAPAP) 500 MG tablet GIVE 2 TABLETS (1000 MG) BY MOUTH 3 TIMES DAILY FOR DISCOMFORT/PAIN 07/01/16  Yes [provider]  Cholecalciferol (VITAMIN D3) 2000 UNITS TABS Take 2,000 Units by mouth once.   Yes [provider]  divalproex (DEPAKOTE) 250 MG DR tablet Take 250 mg by mouth 2 (two) times daily.   Yes [provider]  DULoxetine (CYMBALTA) 30 MG capsule Take by mouth.   Yes [provider]  ferrous gluconate (FERGON) 240 (27 FE) MG tablet Take 240 mg by mouth 3 (three) times daily with meals.   Yes [provider]  folic acid (  FOLVITE) 800 MCG tablet Take 400 mcg by mouth daily.   Yes [provider]  lisinopril (PRINIVIL,ZESTRIL) 5 MG tablet Take 5 mg by mouth daily.   Yes [provider]  pantoprazole (PROTONIX) 40 MG tablet Take by mouth.   Yes [provider]  PARoxetine (PAXIL) 20 MG tablet Take 20 mg by mouth daily.   Yes [provider]  polyethylene glycol (MIRALAX / GLYCOLAX) packet Take by mouth.   Yes [provider]  QUEtiapine (SEROQUEL) 100 MG tablet Take 100 mg by mouth at bedtime.   Yes [provider]  QUEtiapine (SEROQUEL) 300 MG tablet Take 300 mg by mouth at bedtime.   Yes [provider]  sitaGLIPtin (JANUVIA) 100 MG tablet Take 100 mg by mouth daily.   Yes [provider]  clonazePAM (KLONOPIN) 0.5 MG tablet Take 0.5 mg by mouth at bedtime.    [provider]    docusate sodium (COLACE) 100 MG capsule Take 100 mg by mouth 2 (two) times daily.    [provider]  Insulin Detemir (LEVEMIR FLEXPEN [place]) Inject 18 Units into the skin daily.    [provider]  letrozole (FEMARA) 2.5 MG tablet Take 2.5 mg by mouth daily.    [provider]  nystatin-triamcinolone ointment (MYCOLOG) Apply 1 application topically 2 (two) times daily for 10 days. Do not use for longer than 10 days 01/05/18 01/15/18  Luvenia Redden, PA-C    Family History Family History  Problem Relation Age of Onset  . Other Mother   . Other Father     Social History Social History   Tobacco Use  . Smoking status: Never Smoker  . Smokeless tobacco: Never Used  Substance Use Topics  . Alcohol use: No  . Drug use: No     Allergies   Actos [pioglitazone]   Review of Systems Review of Systems as noted above in HPI.  Other systems reviewed and found to be negative-   Physical Exam Triage Vital Signs ED Triage Vitals  Enc Vitals Group     BP 01/05/18 1823 121/87     Pulse Rate 01/05/18 1823 79     Resp 01/05/18 1823 16     Temp 01/05/18 1823 97.7 F (36.5 C)     Temp Source 01/05/18 1823 Oral     SpO2 01/05/18 1823 100 %     Weight 01/05/18 1822 200 lb (90.7 kg)     Height 01/05/18 1822 5\' 8"  (1.727 m)     Head Circumference --      Peak Flow --      Pain Score 01/05/18 1820 0     Pain Loc --      Pain Edu? --      Excl. in [place]? --    No data found.  Updated Vital Signs BP 121/87 (BP Location: Right Arm)   Pulse 79   Temp 97.7 F (36.5 C) (Oral)   Resp 16   Ht 5\' 8"  (1.727 m)   Wt 200 lb (90.7 kg)   SpO2 100%   BMI 30.41 kg/m    Physical Exam  Constitutional: She appears well-developed and well-nourished. No distress.  Eyes: EOM are normal.  Cardiovascular: Normal rate and regular rhythm.  Pulmonary/Chest: Breath sounds normal. No stridor. No respiratory distress.  Skin:  Pill rash to the mid back with no excoriations or  exudate.  See picture.  More erythemic rash around the gluteal cleft.  No exudate noted  and no excoriations.  Patient denies any itching to either area.  Again see photograph below.         UC Treatments / Results  Labs (all labs ordered are listed, but only abnormal results are displayed) Labs Reviewed - No data to display  EKG None  Radiology No results found.  Procedures Procedures (including critical care time)  Medications Ordered in UC Medications - No data to display  Initial Impression / Assessment and Plan / UC Course  I have reviewed the triage vital signs and the nursing notes.  Pertinent labs & imaging results that were available during my care of the patient were reviewed by me and considered in my medical decision making (see chart for details).     Patient with a rash to her mid back and gluteal cleft that was noticed today when she was given a shower by caretaker.  Patient denies any itching.  Gegick also reports some small areas to her scalp.  There are no new changes to medication, detergents, soaps or lotions.  We will give her a prescription for Mycolog to be used twice a day for no more than 10 days.  Advised to follow with primary care provider as needed.  Final Clinical Impressions(s) / UC Diagnoses   Final diagnoses:  Rash     Discharge Instructions     -Mycolog: twice a day to affected areas for no more than 10 days -wash current clothing and bedding -keep area clean and dry -follow up with PCP or urgent care should symptoms worsen or not improve    ED Prescriptions    Medication Sig Dispense Auth. Provider   nystatin-triamcinolone ointment (MYCOLOG) Apply 1 application topically 2 (two) times daily for 10 days. Do not use for longer than 10 days 30 g Luvenia Redden, PA-C     Controlled Substance Prescriptions  Controlled Substance Registry consulted? Not Applicable   Luvenia Redden, PA-C 01/05/18 0712

## 2018-06-29 ENCOUNTER — Ambulatory Visit: Payer: Medicare Other | Admitting: Podiatry

## 2018-07-27 ENCOUNTER — Encounter: Payer: Self-pay | Admitting: Podiatry

## 2018-07-27 ENCOUNTER — Other Ambulatory Visit: Payer: Self-pay

## 2018-07-27 ENCOUNTER — Ambulatory Visit (INDEPENDENT_AMBULATORY_CARE_PROVIDER_SITE_OTHER): Payer: Medicare Other | Admitting: Podiatry

## 2018-07-27 DIAGNOSIS — L603 Nail dystrophy: Secondary | ICD-10-CM

## 2018-07-27 DIAGNOSIS — E119 Type 2 diabetes mellitus without complications: Secondary | ICD-10-CM

## 2018-07-27 NOTE — Progress Notes (Signed)
Subjective:     Patient ID: Carly Collins, female   DOB: 1955/06/09, 63 y.o.   MRN: [id number]  HPI this patient presents the office today with long, ingrowing toenails on both feet. She comes from a home that she states in  [place].  This patient is diabetic. She presents the office today for preventative foot care services   Review of Systems     Objective:   Physical Exam GENERAL APPEARANCE: Alert, conversant. Appropriately groomed. No acute distress.  VASCULAR: Pedal pulses are  palpable at  [facility] and PT bilateral.  Capillary refill time is immediate to all digits,  Normal temperature gradient.  Digital hair growth is present bilateral  NEUROLOGIC: sensation is normal to 5.07 monofilament at 5/5 sites bilateral.  Light touch is intact bilateral, Muscle strength normal.  MUSCULOSKELETAL: acceptable muscle strength, tone and stability bilateral.  Intrinsic muscluature intact bilateral.  HAV 1st MPJ  B/L NAILS  Long ingrowing toenails hallux  B/L. DERMATOLOGIC: skin color, texture, and turgor are within normal limits.  No preulcerative lesions or ulcers  are seen, no interdigital maceration noted.  No open lesions present.  Digital nails are elongated No drainage noted.      Assessment:     Onychodystrophy    Plan:     IE  Debride nails  RTC 6 months.  Gardiner Barefoot DPM

## 2018-10-13 DIAGNOSIS — Z79899 Other long term (current) drug therapy: Secondary | ICD-10-CM | POA: Insufficient documentation

## 2018-10-14 ENCOUNTER — Other Ambulatory Visit: Payer: Self-pay

## 2018-10-14 ENCOUNTER — Encounter: Payer: Self-pay | Admitting: Emergency Medicine

## 2018-10-14 ENCOUNTER — Ambulatory Visit
Admission: EM | Admit: 2018-10-14 | Discharge: 2018-10-14 | Disposition: A | Discharge status: Home / Self Care | Payer: Medicare Other | Attending: Family Medicine | Admitting: Family Medicine

## 2018-10-14 DIAGNOSIS — W19XXXA Unspecified fall, initial encounter: Secondary | ICD-10-CM

## 2018-10-14 DIAGNOSIS — Z23 Encounter for immunization: Secondary | ICD-10-CM

## 2018-10-14 DIAGNOSIS — S0181XA Laceration without foreign body of other part of head, initial encounter: Secondary | ICD-10-CM

## 2018-10-14 MED ORDER — TETANUS-DIPHTH-ACELL PERTUSSIS 5-2.5-18.5 LF-MCG/0.5 IM SUSP
0.5000 mL | Freq: Once | INTRAMUSCULAR | Status: AC
  Administered 2018-10-14: 0.5 mL via INTRAMUSCULAR

## 2018-10-14 NOTE — ED Triage Notes (Signed)
Caregiver states patient fell walking back to her room. Her glasses hit the left side of her eyebrow. Patient stays in a group home.

## 2018-10-14 NOTE — Discharge Instructions (Signed)
No antibiotic ointment.   Normal bathing.  Take care  Dr. Lacinda Axon

## 2018-10-14 NOTE — ED Provider Notes (Addendum)
[facility]    CSN: [id number] Arrival date & time: 10/14/18  1001  History   Chief Complaint Chief Complaint  Patient presents with  . Laceration   HPI   63 year old female presents with a laceration.  Patient has cognitive deficits and history is provided by her relative.  She is in a group home.  Caregiver states that she was called by her group home stating that she suffered a fall this morning.  In doing so her glasses caused a laceration just above her left eyebrow.  Bleeding is well controlled.  Patient is in need of tetanus.  Patient denies pain at this time.  She has no other complaints.  When asked about her fall, patient states that she got tripped up on her shoes.  No other reported symptoms.  No other complaints or concerns at this time.  PMH, Surgical Hx, Family Hx, Social History reviewed and updated as below.  Past Medical History:  Diagnosis Date  . Anxiety   . C. difficile enteritis   . Cancer ([place])   . Depression   . Diabetes mellitus without complication ([place])   . GERD (gastroesophageal reflux disease)   . Headache   . Hyperlipidemia   . Hypertension   . Mental retardation, mild (I.Q. 50-70)   . Moderate intellectual disabilities   . Obesity   . Recurrent UTI   . Tibial fracture   . Vitamin D deficiency     Patient Active Problem List   Diagnosis Date Noted  . Anxiety and depression 10/16/2016  . GERD (gastroesophageal reflux disease) 10/16/2016  . Closed fracture of distal end of left humerus 06/08/2014  . Headache 11/30/2013  . Breast cancer ([place]) 04/18/2013  . Closed fracture of condyle of femur ([place]) 10/05/2012  . Closed fracture of proximal tibia 10/05/2012  . Cognitive deficits 10/05/2012  . Diabetes mellitus ([place]) 10/05/2012    Past Surgical History:  Procedure Laterality Date  . ABDOMINAL SURGERY    . APPENDECTOMY    . BREAST SURGERY    . COLON SURGERY    . COLONOSCOPY N/A 10/26/2014   Procedure: COLONOSCOPY;  Surgeon:  Lollie Sails, MD;  Location: [facility] ENDOSCOPY;  Service: Endoscopy;  Laterality: N/A;  . DILATION AND CURETTAGE OF UTERUS    . ESOPHAGOGASTRODUODENOSCOPY (EGD) WITH PROPOFOL N/A 10/26/2014   Procedure: ESOPHAGOGASTRODUODENOSCOPY (EGD) WITH PROPOFOL;  Surgeon: Lollie Sails, MD;  Location: [facility] ENDOSCOPY;  Service: Endoscopy;  Laterality: N/A;  . FRACTURE SURGERY      OB History    Gravida  0   Para  0   Term  0   Preterm  0   AB  0   Living  0     SAB  0   TAB  0   Ectopic  0   Multiple  0   Live Births  0            Home Medications    Prior to Admission medications   Medication Sig Start Date End Date Taking? Authorizing Provider  acetaminophen (MAPAP) 500 MG tablet GIVE 2 TABLETS (1000 MG) BY MOUTH 3 TIMES DAILY FOR DISCOMFORT/PAIN 07/01/16  Yes [provider]  Cholecalciferol (VITAMIN D3) 2000 UNITS TABS Take 2,000 Units by mouth once.   Yes [provider]  clonazePAM (KLONOPIN) 0.5 MG tablet Take 0.5 mg by mouth at bedtime.   Yes [provider]  divalproex (DEPAKOTE) 250 MG DR tablet Take 250 mg by mouth 2 (two) times  daily.   Yes [provider]  docusate sodium (COLACE) 100 MG capsule Take 100 mg by mouth 2 (two) times daily.   Yes [provider]  DULoxetine (CYMBALTA) 30 MG capsule Take by mouth.   Yes [provider]  ferrous gluconate (FERGON) 240 (27 FE) MG tablet Take 240 mg by mouth 3 (three) times daily with meals.   Yes [provider]  folic acid (FOLVITE) 696 MCG tablet Take 400 mcg by mouth daily.   Yes [provider]  Insulin Detemir (LEVEMIR FLEXPEN [place]) Inject 18 Units into the skin daily.   Yes [provider]  letrozole (FEMARA) 2.5 MG tablet Take 2.5 mg by mouth daily.   Yes [provider]  lisinopril (PRINIVIL,ZESTRIL) 5 MG tablet Take 5 mg by mouth daily.   Yes [provider]  pantoprazole (PROTONIX) 40 MG tablet Take by mouth.    Yes [provider]  PARoxetine (PAXIL) 20 MG tablet Take 20 mg by mouth daily.   Yes [provider]  polyethylene glycol (MIRALAX / GLYCOLAX) packet Take by mouth.   Yes [provider]  QUEtiapine (SEROQUEL) 100 MG tablet Take 100 mg by mouth at bedtime.   Yes [provider]  QUEtiapine (SEROQUEL) 300 MG tablet Take 300 mg by mouth at bedtime.   Yes [provider]  sitaGLIPtin (JANUVIA) 100 MG tablet Take 100 mg by mouth daily.   Yes [provider]    Family History Family History  Problem Relation Age of Onset  . Other Mother   . Other Father     Social History Social History   Tobacco Use  . Smoking status: Never Smoker  . Smokeless tobacco: Never Used  Substance Use Topics  . Alcohol use: No  . Drug use: No     Allergies   Pioglitazone   Review of Systems Review of Systems  Skin: Positive for wound.  Neurological: Negative for headaches.   Physical Exam Triage Vital Signs ED Triage Vitals  Enc Vitals Group     BP 10/14/18 1023 108/70     Pulse Rate 10/14/18 1023 71     Resp 10/14/18 1023 18     Temp 10/14/18 1023 98.3 F (36.8 C)     Temp Source 10/14/18 1023 Oral     SpO2 10/14/18 1023 98 %     Weight 10/14/18 1021 156 lb (70.8 kg)     Height --      Head Circumference --      Peak Flow --      Pain Score 10/14/18 1021 0     Pain Loc --      Pain Edu? --      Excl. in [place]? --    Updated Vital Signs BP 108/70 (BP Location: Right Arm)   Pulse 71   Temp 98.3 F (36.8 C) (Oral)   Resp 18   Wt 70.8 kg   SpO2 98%   BMI 23.72 kg/m   Visual Acuity Right Eye Distance:   Left Eye Distance:   Bilateral Distance:    Right Eye Near:   Left Eye Near:    Bilateral Near:     Physical Exam Vitals signs and nursing note reviewed.  Constitutional:      General: She is not in acute distress.    Appearance: Normal appearance.  HENT:     Head:      Comments: 1.3 cm linear laceration noted at  the labeled location.  Well approximated.  No bleeding. Eyes:     General:        Right eye: No discharge.        Left eye: No discharge.     Conjunctiva/sclera: Conjunctivae normal.  Cardiovascular:     Rate and Rhythm: Normal rate and regular rhythm.  Pulmonary:     Effort: Pulmonary effort is normal.     Breath sounds: Normal breath sounds.  Neurological:     Mental Status: She is alert.  Psychiatric:        Behavior: Behavior normal.     Comments: Anxious.    UC Treatments / Results  Labs (all labs ordered are listed, but only abnormal results are displayed) Labs Reviewed - No data to display  EKG None  Radiology No results found.  Procedures Laceration Repair Date/Time: 10/14/2018 11:27 AM Performed by: Coral Spikes, DO Authorized by: Coral Spikes, DO   Consent:    Consent obtained:  Verbal   Consent given by: Caregiver. Anesthesia (see MAR for exact dosages):    Anesthesia method:  None Laceration details:    Location:  Face   Face location:  L eyebrow   Length (cm):  1.3 Repair type:    Repair type:  Simple Treatment:    Area cleansed with:  Shur-Clens Skin repair:    Repair method:  Tissue adhesive Approximation:    Approximation:  Close Post-procedure details:    Patient tolerance of procedure:  Tolerated well, no immediate complications   (including critical care time)  Medications Ordered in UC Medications  Tdap (BOOSTRIX) injection 0.5 mL (0.5 mLs Intramuscular Given 10/14/18 1028)    Initial Impression / Assessment and Plan / UC Course  I have reviewed the triage vital signs and the nursing notes.  Pertinent labs & imaging results that were available during my care of the patient were reviewed by me and considered in my medical decision making (see chart for details).    63 year old female presents with facial laceration.  Closed with Dermabond.  Tetanus given.  Final Clinical Impressions(s) / UC Diagnoses   Final diagnoses:  Facial  laceration, initial encounter     Discharge Instructions     No antibiotic ointment.   Normal bathing.  Take care  Dr. Lacinda Axon     ED Prescriptions    None     Controlled Substance Prescriptions [place] Controlled Substance Registry consulted? Not Applicable   Coral Spikes, DO 10/14/18 1128    Thersa Salt G, DO 10/14/18 1130

## 2018-11-11 DIAGNOSIS — N182 Chronic kidney disease, stage 2 (mild): Secondary | ICD-10-CM | POA: Insufficient documentation

## 2018-11-11 DIAGNOSIS — N183 Chronic kidney disease, stage 3 unspecified: Secondary | ICD-10-CM | POA: Insufficient documentation

## 2019-01-17 ENCOUNTER — Ambulatory Visit
Admission: EM | Admit: 2019-01-17 | Discharge: 2019-01-17 | Disposition: A | Discharge status: Home / Self Care | Payer: Medicare Other | Attending: Emergency Medicine | Admitting: Emergency Medicine

## 2019-01-17 ENCOUNTER — Other Ambulatory Visit: Payer: Self-pay

## 2019-01-17 ENCOUNTER — Encounter: Payer: Self-pay | Admitting: Emergency Medicine

## 2019-01-17 DIAGNOSIS — Z79899 Other long term (current) drug therapy: Secondary | ICD-10-CM | POA: Diagnosis not present

## 2019-01-17 DIAGNOSIS — F71 Moderate intellectual disabilities: Secondary | ICD-10-CM | POA: Insufficient documentation

## 2019-01-17 DIAGNOSIS — W19XXXD Unspecified fall, subsequent encounter: Secondary | ICD-10-CM | POA: Diagnosis not present

## 2019-01-17 DIAGNOSIS — F70 Mild intellectual disabilities: Secondary | ICD-10-CM | POA: Insufficient documentation

## 2019-01-17 DIAGNOSIS — K219 Gastro-esophageal reflux disease without esophagitis: Secondary | ICD-10-CM | POA: Insufficient documentation

## 2019-01-17 DIAGNOSIS — E119 Type 2 diabetes mellitus without complications: Secondary | ICD-10-CM | POA: Diagnosis not present

## 2019-01-17 DIAGNOSIS — Z87440 Personal history of urinary (tract) infections: Secondary | ICD-10-CM | POA: Diagnosis not present

## 2019-01-17 DIAGNOSIS — E785 Hyperlipidemia, unspecified: Secondary | ICD-10-CM | POA: Insufficient documentation

## 2019-01-17 DIAGNOSIS — N390 Urinary tract infection, site not specified: Secondary | ICD-10-CM

## 2019-01-17 DIAGNOSIS — R300 Dysuria: Secondary | ICD-10-CM

## 2019-01-17 DIAGNOSIS — W19XXXA Unspecified fall, initial encounter: Secondary | ICD-10-CM | POA: Diagnosis not present

## 2019-01-17 DIAGNOSIS — F419 Anxiety disorder, unspecified: Secondary | ICD-10-CM | POA: Diagnosis not present

## 2019-01-17 DIAGNOSIS — I10 Essential (primary) hypertension: Secondary | ICD-10-CM | POA: Insufficient documentation

## 2019-01-17 DIAGNOSIS — F329 Major depressive disorder, single episode, unspecified: Secondary | ICD-10-CM | POA: Insufficient documentation

## 2019-01-17 LAB — URINALYSIS, COMPLETE (UACMP) WITH MICROSCOPIC
Bilirubin Urine: NEGATIVE
Glucose, UA: NEGATIVE mg/dL
Hgb urine dipstick: NEGATIVE
Ketones, ur: NEGATIVE mg/dL
Nitrite: NEGATIVE
Protein, ur: NEGATIVE mg/dL
Specific Gravity, Urine: 1.015 (ref 1.005–1.030)
pH: 7 (ref 5.0–8.0)

## 2019-01-17 MED ORDER — CEPHALEXIN 500 MG PO CAPS: 500.0000 mg | ORAL_CAPSULE | Freq: Two times a day (BID) | ORAL | 0 refills | Status: AC

## 2019-01-17 NOTE — Discharge Instructions (Addendum)
Take medication as prescribed. Rest. Drink plenty of fluids. Monitor.  ° °Follow up with your primary care physician this week. Return to Urgent care for new or worsening concerns.  ° °

## 2019-01-17 NOTE — ED Provider Notes (Signed)
[facility] ____________________________________________  Time seen: Approximately 12:39 PM  I have reviewed the triage vital signs and the nursing notes.   HISTORY  Chief Complaint Fall   HPI Carly Collins is a 63 y.o. female presenting with assisted living caregiver at bedside for evaluation and concern of UTI.  Caregiver helping as is patient with mental impairment.  Reports patient has a history of recurrent UTIs with some similar presentation.  Reports she has been having some burning with urination for the last few days.  Also reports she has had 3 falls in the last week at home, mostly including of her "sliding "down in the shower.  Reports she was seen by her primary care 3 days ago after the fall and only sustained bruises without further injury.  Patient denies any pain at this time.  Caregiver denies any appearance of pain other than with urination.  Patient denies any neck or back pain, extremity pain, abdominal pain.  No recent fevers or cough.  No recent antibiotic use.  Janace Litten., MD: PCP   Past Medical History:  Diagnosis Date  . Anxiety   . C. difficile enteritis   . Cancer ([place])   . Depression   . Diabetes mellitus without complication ([place])   . GERD (gastroesophageal reflux disease)   . Headache   . Hyperlipidemia   . Hypertension   . Mental retardation, mild (I.Q. 50-70)   . Moderate intellectual disabilities   . Obesity   . Recurrent UTI   . Tibial fracture   . Vitamin D deficiency     Patient Active Problem List   Diagnosis Date Noted  . Anxiety and depression 10/16/2016  . GERD (gastroesophageal reflux disease) 10/16/2016  . Closed fracture of distal end of left humerus 06/08/2014  . Headache 11/30/2013  . Breast cancer ([place]) 04/18/2013  . Closed fracture of condyle of femur ([place]) 10/05/2012  . Closed fracture of proximal tibia 10/05/2012  . Cognitive deficits 10/05/2012  . Diabetes mellitus ([place]) 10/05/2012    Past  Surgical History:  Procedure Laterality Date  . ABDOMINAL SURGERY    . APPENDECTOMY    . BREAST SURGERY    . COLON SURGERY    . COLONOSCOPY N/A 10/26/2014   Procedure: COLONOSCOPY;  Surgeon: Lollie Sails, MD;  Location: [facility] ENDOSCOPY;  Service: Endoscopy;  Laterality: N/A;  . DILATION AND CURETTAGE OF UTERUS    . ESOPHAGOGASTRODUODENOSCOPY (EGD) WITH PROPOFOL N/A 10/26/2014   Procedure: ESOPHAGOGASTRODUODENOSCOPY (EGD) WITH PROPOFOL;  Surgeon: Lollie Sails, MD;  Location: [facility] ENDOSCOPY;  Service: Endoscopy;  Laterality: N/A;  . FRACTURE SURGERY       No current facility-administered medications for this encounter.   Current Outpatient Medications:  .  acetaminophen (MAPAP) 500 MG tablet, GIVE 2 TABLETS (1000 MG) BY MOUTH 3 TIMES DAILY FOR DISCOMFORT/PAIN, Disp: , Rfl:  .  Cholecalciferol (VITAMIN D3) 2000 UNITS TABS, Take 2,000 Units by mouth once., Disp: , Rfl:  .  clonazePAM (KLONOPIN) 0.5 MG tablet, Take 0.5 mg by mouth at bedtime., Disp: , Rfl:  .  divalproex (DEPAKOTE) 250 MG DR tablet, Take 250 mg by mouth 2 (two) times daily., Disp: , Rfl:  .  docusate sodium (COLACE) 100 MG capsule, Take 100 mg by mouth 2 (two) times daily., Disp: , Rfl:  .  DULoxetine (CYMBALTA) 30 MG capsule, Take 60 mg by mouth. , Disp: , Rfl:  .  ferrous gluconate (FERGON) 240 (27 FE) MG tablet, Take 240 mg by mouth  3 (three) times daily with meals., Disp: , Rfl:  .  folic acid (FOLVITE) Q000111Q MCG tablet, Take 400 mcg by mouth daily., Disp: , Rfl:  .  letrozole (FEMARA) 2.5 MG tablet, Take 2.5 mg by mouth daily., Disp: , Rfl:  .  PARoxetine (PAXIL) 20 MG tablet, Take 20 mg by mouth daily., Disp: , Rfl:  .  polyethylene glycol (MIRALAX / GLYCOLAX) packet, Take by mouth., Disp: , Rfl:  .  QUEtiapine (SEROQUEL) 100 MG tablet, Take 100 mg by mouth at bedtime., Disp: , Rfl:  .  QUEtiapine (SEROQUEL) 300 MG tablet, Take 300 mg by mouth at bedtime., Disp: , Rfl:  .  cephALEXin (KEFLEX) 500 MG capsule, Take  1 capsule (500 mg total) by mouth 2 (two) times daily for 7 days., Disp: 14 capsule, Rfl: 0 .  pantoprazole (PROTONIX) 40 MG tablet, Take by mouth., Disp: , Rfl:   Allergies Pioglitazone  Family History  Problem Relation Age of Onset  . Other Mother   . Other Father     Social History Social History   Tobacco Use  . Smoking status: Never Smoker  . Smokeless tobacco: Never Used  Substance Use Topics  . Alcohol use: No  . Drug use: No    Review of Systems Constitutional: No feve ENT: No sore throat. Cardiovascular: Denies chest pain. Respiratory: Denies shortness of breath. Gastrointestinal: No abdominal pain.  No nausea, no vomiting.  No diarrhea.  Genitourinary: Positive for dysuria. Musculoskeletal: Negative for back pain. Skin: Negative for rash.   ____________________________________________   PHYSICAL EXAM:  VITAL SIGNS: ED Triage Vitals  Enc Vitals Group     BP 01/17/19 1141 104/67     Pulse Rate 01/17/19 1141 87     Resp 01/17/19 1141 16     Temp 01/17/19 1141 98.1 F (36.7 C)     Temp Source 01/17/19 1141 Oral     SpO2 01/17/19 1141 97 %     Weight 01/17/19 1135 139 lb (63 kg)     Height --      Head Circumference --      Peak Flow --      Pain Score 01/17/19 1135 0     Pain Loc --      Pain Edu? --      Excl. in [place]? --     Constitutional: Alert. Well appearing and in no acute distress. ENT      Head: Normocephalic and atraumatic. Cardiovascular: Normal rate, regular rhythm. Grossly normal heart sounds.  Good peripheral circulation. Respiratory: Normal respiratory effort without tachypnea nor retractions. Breath sounds are clear and equal bilaterally. No wheezes, rales, rhonchi. Gastrointestinal: Soft and nontender.  No CVA tenderness. Musculoskeletal:  No midline cervical, thoracic or lumbar tenderness to palpation.  Neurologic:  Alert and cooperative.  Skin:  Skin is warm, dry. Psychiatric: Mood and affect are normal.     ___________________________________________   LABS (all labs ordered are listed, but only abnormal results are displayed)  Labs Reviewed  URINALYSIS, COMPLETE (UACMP) WITH MICROSCOPIC - Abnormal; Notable for the following components:      Result Value   APPearance HAZY (*)    Leukocytes,Ua SMALL (*)    Bacteria, UA FEW (*)    All other components within normal limits  URINE CULTURE     INITIAL IMPRESSION / ASSESSMENT AND PLAN / ED COURSE  Pertinent labs & imaging results that were available during my care of the patient were reviewed by me and considered in  my medical decision making (see chart for details).  Well-appearing patient.  Patient denies pain or other complaints.  Caregiver at bedside helping as historian as well.  Patient does report burning with urination for the last several days.  Urinalysis reviewed, suspect UTI.  Will treat with oral Keflex and culture.  encouraged continue to follow-up with primary care this week.  Monitor.Discussed indication, risks and benefits of medications with patient.  Discussed follow up with Primary care physician this week. Discussed follow up and return parameters including no resolution or any worsening concerns. Patient verbalized understanding and agreed to plan.   ____________________________________________   FINAL CLINICAL IMPRESSION(S) / ED DIAGNOSES  Final diagnoses:  Urinary tract infection without hematuria, site unspecified  Fall, subsequent encounter     ED Discharge Orders         Ordered    cephALEXin (KEFLEX) 500 MG capsule  2 times daily     01/17/19 1241           Note: This dictation was prepared with Dragon dictation along with smaller phrase technology. Any transcriptional errors that result from this process are unintentional.         Marylene Land, NP 01/17/19 1356

## 2019-01-17 NOTE — ED Triage Notes (Signed)
Caregiver states that she fell on Wednesday night and and slid down in the shower on Thursday.  Patient was seen by her PCP for her falls.  Caregiver states that she brought her here because she thinks that patient might have a UTI.

## 2019-01-18 LAB — URINE CULTURE

## 2019-01-28 ENCOUNTER — Other Ambulatory Visit: Payer: Self-pay

## 2019-01-28 ENCOUNTER — Encounter: Payer: Self-pay | Admitting: Podiatry

## 2019-01-28 ENCOUNTER — Ambulatory Visit (INDEPENDENT_AMBULATORY_CARE_PROVIDER_SITE_OTHER): Payer: Medicare Other | Admitting: Podiatry

## 2019-01-28 DIAGNOSIS — N390 Urinary tract infection, site not specified: Secondary | ICD-10-CM | POA: Insufficient documentation

## 2019-01-28 DIAGNOSIS — Z87898 Personal history of other specified conditions: Secondary | ICD-10-CM | POA: Insufficient documentation

## 2019-01-28 DIAGNOSIS — L603 Nail dystrophy: Secondary | ICD-10-CM

## 2019-01-28 NOTE — Progress Notes (Signed)
Subjective:     Patient ID: Carly Collins, female   DOB: [birth year], 63 y.o.   MRN: [id number]  HPI this patient presents the office today with long, ingrowing toenails on both feet. She comes from a home that she states in  In [place].  This patient is diabetic. She presents the office today for preventative foot care services   Review of Systems     Objective:   Physical Exam GENERAL APPEARANCE: Alert, conversant. Appropriately groomed. No acute distress.  VASCULAR: Pedal pulses are  palpable at  [facility] and PT bilateral.  Capillary refill time is immediate to all digits,  Normal temperature gradient.  Digital hair growth is present bilateral  NEUROLOGIC: sensation is normal to 5.07 monofilament at 5/5 sites bilateral.  Light touch is intact bilateral, Muscle strength normal.  MUSCULOSKELETAL: acceptable muscle strength, tone and stability bilateral.  Intrinsic muscluature intact bilateral.  HAV 1st MPJ  B/L NAILS  Long ingrowing toenails hallux  B/L. DERMATOLOGIC: skin color, texture, and turgor are within normal limits.  No preulcerative lesions or ulcers  are seen, no interdigital maceration noted.  No open lesions present.  Digital nails are elongated No drainage noted.      Assessment:     Onychodystrophy    Plan:     IE  Debride nails  RTC 6 months.  Gardiner Barefoot DPM

## 2019-02-24 DIAGNOSIS — M810 Age-related osteoporosis without current pathological fracture: Secondary | ICD-10-CM | POA: Insufficient documentation

## 2019-07-29 ENCOUNTER — Ambulatory Visit: Payer: Medicare Other | Admitting: Podiatry

## 2019-08-05 ENCOUNTER — Ambulatory Visit: Payer: Medicare Other | Admitting: Podiatry

## 2019-11-05 ENCOUNTER — Encounter: Payer: Self-pay | Admitting: Emergency Medicine

## 2019-11-05 ENCOUNTER — Ambulatory Visit
Admission: EM | Admit: 2019-11-05 | Discharge: 2019-11-05 | Disposition: A | Discharge status: Home / Self Care | Payer: Medicare Other | Attending: Family Medicine | Admitting: Family Medicine

## 2019-11-05 ENCOUNTER — Other Ambulatory Visit: Payer: Self-pay

## 2019-11-05 DIAGNOSIS — R197 Diarrhea, unspecified: Secondary | ICD-10-CM | POA: Diagnosis present

## 2019-11-05 DIAGNOSIS — E860 Dehydration: Secondary | ICD-10-CM | POA: Diagnosis present

## 2019-11-05 LAB — CBC WITH DIFFERENTIAL/PLATELET
Abs Immature Granulocytes: 0.01 10*3/uL (ref 0.00–0.07)
Basophils Absolute: 0 10*3/uL (ref 0.0–0.1)
Basophils Relative: 0 %
Eosinophils Absolute: 0 10*3/uL (ref 0.0–0.5)
Eosinophils Relative: 0 %
HCT: 33.1 % — ABNORMAL LOW (ref 36.0–46.0)
Hemoglobin: 11.3 g/dL — ABNORMAL LOW (ref 12.0–15.0)
Immature Granulocytes: 0 %
Lymphocytes Relative: 15 %
Lymphs Abs: 0.8 10*3/uL (ref 0.7–4.0)
MCH: 31 pg (ref 26.0–34.0)
MCHC: 34.1 g/dL (ref 30.0–36.0)
MCV: 90.9 fL (ref 80.0–100.0)
Monocytes Absolute: 0.6 10*3/uL (ref 0.1–1.0)
Monocytes Relative: 11 %
Neutro Abs: 3.7 10*3/uL (ref 1.7–7.7)
Neutrophils Relative %: 74 %
Platelets: 160 10*3/uL (ref 150–400)
RBC: 3.64 MIL/uL — ABNORMAL LOW (ref 3.87–5.11)
RDW: 14.3 % (ref 11.5–15.5)
WBC: 5.1 10*3/uL (ref 4.0–10.5)
nRBC: 0 % (ref 0.0–0.2)

## 2019-11-05 LAB — COMPREHENSIVE METABOLIC PANEL
ALT: 87 U/L — ABNORMAL HIGH (ref 0–44)
AST: 77 U/L — ABNORMAL HIGH (ref 15–41)
Albumin: 3.5 g/dL (ref 3.5–5.0)
Alkaline Phosphatase: 123 U/L (ref 38–126)
Anion gap: 7 (ref 5–15)
BUN: 35 mg/dL — ABNORMAL HIGH (ref 8–23)
CO2: 29 mmol/L (ref 22–32)
Calcium: 8.9 mg/dL (ref 8.9–10.3)
Chloride: 100 mmol/L (ref 98–111)
Creatinine, Ser: 1.09 mg/dL — ABNORMAL HIGH (ref 0.44–1.00)
GFR calc Af Amer: >60 mL/min (ref >60)
GFR calc non Af Amer: 54 mL/min — ABNORMAL LOW (ref >60)
Glucose, Bld: 140 mg/dL — ABNORMAL HIGH (ref 70–99)
Potassium: 4.3 mmol/L (ref 3.5–5.1)
Sodium: 136 mmol/L (ref 135–145)
Total Bilirubin: 0.8 mg/dL (ref 0.3–1.2)
Total Protein: 6.6 g/dL (ref 6.5–8.1)

## 2019-11-05 LAB — LIPASE, BLOOD: Lipase: 24 U/L (ref 11–51)

## 2019-11-05 NOTE — ED Provider Notes (Signed)
[facility]    CSN: [id number] Arrival date & time: 11/05/19  1209  History   Chief Complaint Chief Complaint  Patient presents with  . Diarrhea  . Abdominal Pain   HPI  64 year old female who resides in a group home presents for evaluation of diarrhea and reported abdominal pain.  Patient presents from a group home.  Caregiver reports that she has had diarrhea and has been complaining of abdominal pain for the past 3 days.  Patient currently denies abdominal pain.  History limited due to the fact that patient has multiple caregivers and patient has electro disabilities and cannot give an adequate history.  No reported fever.  She ate breakfast this morning without difficulty.  She has not eaten lunch.  Caregiver feels like she is not her normal self.  Blood pressure low currently, 93/53.  There is another resident in the group home that has diarrhea as well.  Patient has a history of C. difficile.  This is a concern.   Past Medical History:  Diagnosis Date  . Anxiety   . C. difficile enteritis   . Cancer ([place])   . Depression   . Diabetes mellitus without complication ([place])   . GERD (gastroesophageal reflux disease)   . Headache   . Hyperlipidemia   . Hypertension   . Mental retardation, mild (I.Q. 50-70)   . Moderate intellectual disabilities   . Obesity   . Recurrent UTI   . Tibial fracture   . Vitamin D deficiency     Patient Active Problem List   Diagnosis Date Noted  . History of abnormal mammogram 01/28/2019  . Recurrent UTI 01/28/2019  . Chronic kidney disease (CKD), stage III (moderate) 11/11/2018  . Long-term use of high-risk medication 10/13/2018  . Anxiety 07/31/2017  . Anxiety and depression 10/16/2016  . GERD (gastroesophageal reflux disease) 10/16/2016  . Hyperlipidemia associated with type 2 diabetes mellitus ([place]) 08/29/2016  . Moderate intellectual disabilities 11/15/2015  . IDA (iron deficiency anemia) 09/22/2014  . Closed fracture of  distal end of left humerus 06/08/2014  . Headache 11/30/2013  . Breast cancer ([place]) 04/18/2013  . History of Clostridium difficile 10/30/2012  . Symptomatic cholelithiasis 10/26/2012  . Closed fracture of condyle of femur ([place]) 10/05/2012  . Closed fracture of proximal tibia 10/05/2012  . Cognitive deficits 10/05/2012  . Diabetes mellitus ([place]) 10/05/2012  . Hypertension associated with diabetes ([place]) 10/05/2012  . Hyperlipidemia, unspecified 10/05/2012  . Obesity 10/05/2012  . Vitamin D deficiency, unspecified 10/05/2012  . Urinary tract infection 10/05/2012  . Left tibial fracture 09/10/2012  . 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 Vitamin B12 deficiency 09/02/2000    Past Surgical History:  Procedure Laterality Date  . ABDOMINAL SURGERY    . APPENDECTOMY    . BREAST SURGERY    . COLON SURGERY    . COLONOSCOPY N/A 10/26/2014   Procedure: COLONOSCOPY;  Surgeon: Lollie Sails, MD;  Location: [facility] ENDOSCOPY;  Service: Endoscopy;  Laterality: N/A;  . DILATION AND CURETTAGE OF UTERUS    . ESOPHAGOGASTRODUODENOSCOPY (EGD) WITH PROPOFOL N/A 10/26/2014   Procedure: ESOPHAGOGASTRODUODENOSCOPY (EGD) WITH PROPOFOL;  Surgeon: Lollie Sails, MD;  Location: [facility]COPY;  Service: Endoscopy;  Laterality: N/A;  . FRACTURE SURGERY      OB History    Gravida  0   Para  0   Term  0   Preterm  0   AB  0   Living  0  SAB  0   TAB  0   Ectopic  0   Multiple  0   Live Births  0            Home Medications    Prior to Admission medications   Medication Sig Start Date End Date Taking? Authorizing Provider  acetaminophen (MAPAP) 500 MG tablet GIVE 2 TABLETS (1000 MG) BY MOUTH 3 TIMES DAILY FOR DISCOMFORT/PAIN 07/01/16  Yes [provider]  alendronate (FOSAMAX) 70 MG tablet Take 70 mg by mouth once a week. 10/08/19  Yes [provider]  Cholecalciferol (VITAMIN D3) 2000 UNITS TABS Take 2,000 Units by mouth once.    Yes [provider]  clonazePAM (KLONOPIN) 0.5 MG tablet Take 0.5 mg by mouth at bedtime.   Yes [provider]  clonazePAM (KLONOPIN) 1 MG tablet Take 1 mg by mouth 2 (two) times daily as needed. 10/10/19  Yes [provider]  divalproex (DEPAKOTE) 250 MG DR tablet Take 250 mg by mouth 2 (two) times daily.   Yes [provider]  docusate sodium (COLACE) 100 MG capsule Take 100 mg by mouth 2 (two) times daily.   Yes [provider]  DULoxetine (CYMBALTA) 30 MG capsule Take 60 mg by mouth.    Yes [provider]  DULoxetine (CYMBALTA) 30 MG capsule Take 30 mg by mouth daily. Take in addition to 60 mg for a total of 90 mg daily   Yes [provider]  ferrous gluconate (FERGON) 240 (27 FE) MG tablet Take 240 mg by mouth 3 (three) times daily with meals.   Yes [provider]  folic acid (FOLVITE) 315 MCG tablet Take 400 mcg by mouth daily.   Yes [provider]  hydrOXYzine (ATARAX/VISTARIL) 25 MG tablet Take 25 mg by mouth 2 (two) times daily.   Yes [provider]  letrozole (FEMARA) 2.5 MG tablet Take 2.5 mg by mouth daily.   Yes [provider]  pantoprazole (PROTONIX) 40 MG tablet Take by mouth.   Yes [provider]  PARoxetine (PAXIL) 20 MG tablet Take 20 mg by mouth daily.   Yes [provider]  polyethylene glycol (MIRALAX / GLYCOLAX) packet Take by mouth.   Yes [provider]  QUEtiapine (SEROQUEL) 100 MG tablet Take 100 mg by mouth at bedtime.   Yes [provider]  QUEtiapine (SEROQUEL) 300 MG tablet Take 300 mg by mouth at bedtime.   Yes [provider]  sitaGLIPtin (JANUVIA) 100 MG tablet Take 100 mg by mouth daily.  01/17/19  [provider]    Family History Family History  Problem Relation Age of Onset  . Other Mother   . Other Father     Social History Social History   Tobacco Use  . Smoking status: Never Smoker  .  Smokeless tobacco: Never Used  Vaping Use  . Vaping Use: Never used  Substance Use Topics  . Alcohol use: No  . Drug use: No     Allergies   Pioglitazone   Review of Systems Review of Systems  Constitutional: Negative for fever.  Gastrointestinal: Positive for abdominal pain and diarrhea.   Physical Exam Triage Vital Signs ED Triage Vitals  Enc Vitals Group     BP 11/05/19 1313 (!) 93/53     Pulse Rate 11/05/19 1313 75     Resp 11/05/19 1313 18     Temp 11/05/19 1313 98 F (36.7 C)     Temp Source 11/05/19  1313 Oral     SpO2 11/05/19 1313 99 %     Weight 11/05/19 1314 123 lb (55.8 kg)     Height 11/05/19 1314 5\' 3"  (1.6 m)     Head Circumference --      Peak Flow --      Pain Score 11/05/19 1435 0     Pain Loc --      Pain Edu? --      Excl. in [place]? --    Updated Vital Signs BP (!) 93/53 (BP Location: Left Arm)   Pulse 75   Temp 98 F (36.7 C) (Oral)   Resp 18   Ht 5\' 3"  (1.6 m)   Wt 55.8 kg   SpO2 99%   BMI 21.79 kg/m   Visual Acuity Right Eye Distance:   Left Eye Distance:   Bilateral Distance:    Right Eye Near:   Left Eye Near:    Bilateral Near:     Physical Exam   UC Treatments / Results  Labs (all labs ordered are listed, but only abnormal results are displayed) Labs Reviewed  CBC WITH DIFFERENTIAL/PLATELET - Abnormal; Notable for the following components:      Result Value   RBC 3.64 (*)    Hemoglobin 11.3 (*)    HCT 33.1 (*)    All other components within normal limits  COMPREHENSIVE METABOLIC PANEL - Abnormal; Notable for the following components:   Glucose, Bld 140 (*)    BUN 35 (*)    Creatinine, Ser 1.09 (*)    AST 77 (*)    ALT 87 (*)    GFR calc non Af Amer 54 (*)    All other components within normal limits  LIPASE, BLOOD    EKG   Radiology No results found.  Procedures Procedures (including critical care time)  Medications Ordered in UC Medications - No data to display  Initial Impression / Assessment and  Plan / UC Course  I have reviewed the triage vital signs and the nursing notes.  Pertinent labs & imaging results that were available during my care of the patient were reviewed by me and considered in my medical decision making (see chart for details).    64 year old female presents for evaluation of abdominal pain and diarrhea.  History limited.  BP 93/53 here.  Patient appears slightly dehydrated.  Creatinine 1.09.  No baseline to compare.  Liver enzymes slightly elevated.  No leukocytosis.  Caregiver to return with stool sample for C. difficile and GI panel.  Advised that she needs to push fluids.  If she worsens, she should go directly to the ER.  Final Clinical Impressions(s) / UC Diagnoses   Final diagnoses:  Diarrhea of presumed infectious origin  Dehydration     Discharge Instructions     Lots of fluids.  Return with stool sample  If symptoms persists or worsen, she needs to go to the ER.  Take care  Dr. Lacinda Axon    ED Prescriptions    None     PDMP not reviewed this encounter.   Coral Spikes, [place] 11/05/19 [phone]

## 2019-11-05 NOTE — Discharge Instructions (Addendum)
Lots of fluids.  Return with stool sample  If symptoms persists or worsen, she needs to go to the ER.  Take care  Dr. Lacinda Axon

## 2019-11-05 NOTE — ED Triage Notes (Signed)
Patient in today with her caregiver from [facility] c/o diarrhea and abdominal pain x 3 days. Caregiver states that they have been giving her Imodium without relief. Patient has not been on any recent antibiotics.

## 2019-11-06 ENCOUNTER — Emergency Department: Payer: Medicare Other

## 2019-11-06 ENCOUNTER — Other Ambulatory Visit: Payer: Self-pay

## 2019-11-06 ENCOUNTER — Emergency Department
Admission: EM | Admit: 2019-11-06 | Discharge: 2019-11-06 | Disposition: A | Discharge status: Home / Self Care | Payer: Medicare Other | Attending: Emergency Medicine | Admitting: Emergency Medicine

## 2019-11-06 DIAGNOSIS — N3090 Cystitis, unspecified without hematuria: Secondary | ICD-10-CM | POA: Insufficient documentation

## 2019-11-06 DIAGNOSIS — N390 Urinary tract infection, site not specified: Secondary | ICD-10-CM

## 2019-11-06 DIAGNOSIS — R531 Weakness: Secondary | ICD-10-CM | POA: Diagnosis present

## 2019-11-06 DIAGNOSIS — E119 Type 2 diabetes mellitus without complications: Secondary | ICD-10-CM | POA: Diagnosis not present

## 2019-11-06 DIAGNOSIS — I10 Essential (primary) hypertension: Secondary | ICD-10-CM | POA: Insufficient documentation

## 2019-11-06 LAB — CBC WITH DIFFERENTIAL/PLATELET
Abs Immature Granulocytes: 0.01 10*3/uL (ref 0.00–0.07)
Basophils Absolute: 0 10*3/uL (ref 0.0–0.1)
Basophils Relative: 1 %
Eosinophils Absolute: 0.1 10*3/uL (ref 0.0–0.5)
Eosinophils Relative: 3 %
HCT: 25.6 % — ABNORMAL LOW (ref 36.0–46.0)
Hemoglobin: 8.6 g/dL — ABNORMAL LOW (ref 12.0–15.0)
Immature Granulocytes: 0 %
Lymphocytes Relative: 49 %
Lymphs Abs: 2.2 10*3/uL (ref 0.7–4.0)
MCH: 31.6 pg (ref 26.0–34.0)
MCHC: 33.6 g/dL (ref 30.0–36.0)
MCV: 94.1 fL (ref 80.0–100.0)
Monocytes Absolute: 0.3 10*3/uL (ref 0.1–1.0)
Monocytes Relative: 8 %
Neutro Abs: 1.7 10*3/uL (ref 1.7–7.7)
Neutrophils Relative %: 39 %
Platelets: 144 10*3/uL — ABNORMAL LOW (ref 150–400)
RBC: 2.72 MIL/uL — ABNORMAL LOW (ref 3.87–5.11)
RDW: 14.6 % (ref 11.5–15.5)
WBC: 4.4 10*3/uL (ref 4.0–10.5)
nRBC: 0 % (ref 0.0–0.2)

## 2019-11-06 LAB — CBC
HCT: 28.7 % — ABNORMAL LOW (ref 36.0–46.0)
Hemoglobin: 9.5 g/dL — ABNORMAL LOW (ref 12.0–15.0)
MCH: 30.8 pg (ref 26.0–34.0)
MCHC: 33.1 g/dL (ref 30.0–36.0)
MCV: 93.2 fL (ref 80.0–100.0)
Platelets: 148 10*3/uL — ABNORMAL LOW (ref 150–400)
RBC: 3.08 MIL/uL — ABNORMAL LOW (ref 3.87–5.11)
RDW: 14.6 % (ref 11.5–15.5)
WBC: 4.6 10*3/uL (ref 4.0–10.5)
nRBC: 0 % (ref 0.0–0.2)

## 2019-11-06 LAB — PROTIME-INR
INR: 1.1 (ref 0.8–1.2)
Prothrombin Time: 13.4 seconds (ref 11.4–15.2)

## 2019-11-06 LAB — COMPREHENSIVE METABOLIC PANEL
ALT: 42 U/L (ref 0–44)
AST: 25 U/L (ref 15–41)
Albumin: 2.5 g/dL — ABNORMAL LOW (ref 3.5–5.0)
Alkaline Phosphatase: 89 U/L (ref 38–126)
Anion gap: 7 (ref 5–15)
BUN: 30 mg/dL — ABNORMAL HIGH (ref 8–23)
CO2: 26 mmol/L (ref 22–32)
Calcium: 7.8 mg/dL — ABNORMAL LOW (ref 8.9–10.3)
Chloride: 103 mmol/L (ref 98–111)
Creatinine, Ser: 1.1 mg/dL — ABNORMAL HIGH (ref 0.44–1.00)
GFR calc Af Amer: >60 mL/min (ref >60)
GFR calc non Af Amer: 53 mL/min — ABNORMAL LOW (ref >60)
Glucose, Bld: 129 mg/dL — ABNORMAL HIGH (ref 70–99)
Potassium: 4 mmol/L (ref 3.5–5.1)
Sodium: 136 mmol/L (ref 135–145)
Total Bilirubin: 0.6 mg/dL (ref 0.3–1.2)
Total Protein: 5.1 g/dL — ABNORMAL LOW (ref 6.5–8.1)

## 2019-11-06 LAB — URINALYSIS, COMPLETE (UACMP) WITH MICROSCOPIC
Bilirubin Urine: NEGATIVE
Glucose, UA: 50 mg/dL — AB
Ketones, ur: NEGATIVE mg/dL
Nitrite: POSITIVE — AB
Protein, ur: NEGATIVE mg/dL
Specific Gravity, Urine: 1.011 (ref 1.005–1.030)
WBC, UA: >50 WBC/hpf — ABNORMAL HIGH (ref 0–5)
pH: 5 (ref 5.0–8.0)

## 2019-11-06 LAB — LACTIC ACID, PLASMA: Lactic Acid, Venous: 1.6 mmol/L (ref 0.5–1.9)

## 2019-11-06 LAB — VALPROIC ACID LEVEL: Valproic Acid Lvl: 50 ug/mL (ref 50.0–100.0)

## 2019-11-06 MED ORDER — CEPHALEXIN 500 MG PO CAPS: 500.0000 mg | ORAL_CAPSULE | Freq: Four times a day (QID) | ORAL | 0 refills | Status: AC

## 2019-11-06 MED ORDER — SODIUM CHLORIDE 0.9 % IV SOLN
1.0000 g | Freq: Once | INTRAVENOUS | Status: AC
  Administered 2019-11-06: 1 g via INTRAVENOUS
  Filled 2019-11-06: qty 10

## 2019-11-06 MED ORDER — SODIUM CHLORIDE 0.9 % IV SOLN: Freq: Once | INTRAVENOUS | Status: AC

## 2019-11-06 MED ORDER — IOHEXOL 300 MG/ML  SOLN
75.0000 mL | Freq: Once | INTRAMUSCULAR | Status: AC | PRN
  Administered 2019-11-06: 75 mL via INTRAVENOUS

## 2019-11-06 NOTE — ED Notes (Signed)
Reviewed discharge instructions, follow-up care, and prescriptions with Carly Collins from facilityt. Carly Collins verbalized understanding of all information reviewed. Patient stable, with no distress noted at this time.

## 2019-11-06 NOTE — ED Triage Notes (Signed)
Pt comes EMS with weakness and AMS from [facility]. Pt BP was originally 77/24 and pt has had 1L NS and came up to 110/80. Pt normally slow at baseline but pt is normally oriented and isn't per EMS. Pt keeps saying that she's scared and wants her mom.

## 2019-11-06 NOTE — ED Notes (Signed)
Transport team arrived to take patient back to facility

## 2019-11-06 NOTE — ED Provider Notes (Signed)
ER Provider Note       Time seen: 3:51 PM    I have reviewed the vital signs and the nursing notes. Level V caveat: History/ROS limited by mental retardation HISTORY   Chief Complaint Hypotension    HPI Carly Collins is a 64 y.o. female with a history of C. difficile colitis, cancer, depression, diabetes, GERD, hyperlipidemia, hypertension, mental retardation, who presents today for reported weakness and altered mental status from a group home.  Blood pressure was originally low, she had a liter of fluids and this seemed to resolve her low blood pressure.  She cannot give further review of systems or report.  Past Medical History:  Diagnosis Date   Anxiety    C. difficile enteritis    Cancer ([place])    Depression    Diabetes mellitus without complication ([facility])    GERD (gastroesophageal reflux disease)    Headache    Hyperlipidemia    Hypertension    Mental retardation, mild (I.Q. 50-70)    Moderate intellectual disabilities    Obesity    Recurrent UTI    Tibial fracture    Vitamin D deficiency     Past Surgical History:  Procedure Laterality Date   ABDOMINAL SURGERY     APPENDECTOMY     BREAST SURGERY     COLON SURGERY     COLONOSCOPY N/A 10/26/2014   Procedure: COLONOSCOPY;  Surgeon: Lollie Sails, MD;  Location: [facility] ENDOSCOPY;  Service: Endoscopy;  Laterality: N/A;   DILATION AND CURETTAGE OF UTERUS     ESOPHAGOGASTRODUODENOSCOPY (EGD) WITH PROPOFOL N/A 10/26/2014   Procedure: ESOPHAGOGASTRODUODENOSCOPY (EGD) WITH PROPOFOL;  Surgeon: Lollie Sails, MD;  Location: [facility] ENDOSCOPY;  Service: Endoscopy;  Laterality: N/A;   FRACTURE SURGERY      Allergies Pioglitazone  Review of Systems Unknown, patient denies complaints  All systems negative/normal/unremarkable except as stated in the HPI  ____________________________________________   PHYSICAL EXAM:  VITAL SIGNS: Vitals:   11/06/19 1547 11/06/19 1549  BP:  (!) 107/58   Pulse: 74   Resp: 18   Temp: 97.6 F (36.4 C)   SpO2: 99%     Constitutional: Alert, well appearing and in no distress. Eyes: Conjunctivae are normal. Normal extraocular movements. ENT      Head: Unremarkable      Nose: No congestion/rhinnorhea.      Mouth/Throat: Mucous membranes are moist.      Neck: No stridor. Cardiovascular: Normal rate, regular rhythm. No murmurs, rubs, or gallops. Respiratory: Normal respiratory effort without tachypnea nor retractions. Breath sounds are clear and equal bilaterally. No wheezes/rales/rhonchi. Gastrointestinal: Soft and nontender. Normal bowel sounds Musculoskeletal: Nontender with normal range of motion in extremities. No lower extremity tenderness nor edema. Neurologic:  Normal speech and language. No gross focal neurologic deficits are appreciated.  Skin:  Skin is warm, dry and intact. No rash noted. Psychiatric: Speech and behavior are normal.  ____________________________________________  EKG: Interpreted by me.  Sinus rhythm rate of 74 bpm, borderline T wave abnormalities, long QT, normal axis  ____________________________________________   LABS (pertinent positives/negatives)  Labs Reviewed  URINALYSIS, COMPLETE (UACMP) WITH MICROSCOPIC - Abnormal; Notable for the following components:      Result Value   Color, Urine YELLOW (*)    APPearance CLOUDY (*)    Glucose, UA 50 (*)    Hgb urine dipstick SMALL (*)    Nitrite POSITIVE (*)    Leukocytes,Ua LARGE (*)    WBC, UA >50 (*)    Bacteria,  UA MANY (*)    All other components within normal limits  COMPREHENSIVE METABOLIC PANEL - Abnormal; Notable for the following components:   Glucose, Bld 129 (*)    BUN 30 (*)    Creatinine, Ser 1.10 (*)    Calcium 7.8 (*)    Total Protein 5.1 (*)    Albumin 2.5 (*)    GFR calc non Af Amer 53 (*)    All other components within normal limits  CBC WITH DIFFERENTIAL/PLATELET - Abnormal; Notable for the following components:   RBC 2.72 (*)     Hemoglobin 8.6 (*)    HCT 25.6 (*)    Platelets 144 (*)    All other components within normal limits  CBC - Abnormal; Notable for the following components:   RBC 3.08 (*)    Hemoglobin 9.5 (*)    HCT 28.7 (*)    Platelets 148 (*)    All other components within normal limits  CULTURE, BLOOD (ROUTINE X 2)  CULTURE, BLOOD (ROUTINE X 2)  URINE CULTURE  LACTIC ACID, PLASMA  PROTIME-INR  VALPROIC ACID LEVEL  CBG MONITORING, ED  TYPE AND SCREEN    RADIOLOGY  Images were viewed by me Chest x-ray IMPRESSION: 1. Marked gaseous distention of the stomach and distal colon. No evidence of bowel obstruction. 2. Midline ventral hernia containing short segment of the proximal transverse colon. No evidence of bowel strangulation or incarcerated hernia. 3. Nonspecific mild intrahepatic and extrahepatic biliary duct dilation. The gallbladder is not identified, and is likely surgically absent. This likely reflects physiologic dilatation of the biliary system. 4. Subcutaneous fat stranding overlying the dorsal aspect of the proximal right femur may reflect decubitus ulcer. No evidence of acute osteomyelitis. 5. Aortic Atherosclerosis (ICD10-I70.0). IMPRESSION: Patchy bilateral airspace opacities could reflect edema or infection.  Gaseous distention of bowel in the upper abdomen, mostly colon.  DIFFERENTIAL DIAGNOSIS  Dehydration, electrolyte abnormality, sepsis, UTI, pneumonia, medication side effect  ASSESSMENT AND PLAN  Weakness, cystitis   Plan: The patient had presented for weakness and hypotension. Patient's labs initially were concerning for anemia, but on repeat did not appear to be as dramatically changed.  Urinalysis did resemble urinary tract infection for which we sent a urine culture.  She received some IV Rocephin here.  I did perform a CT due to gaseous distention seen in the colon.  This is of uncertain etiology and may be chronic.  She will be discharged on Keflex  and is otherwise cleared and encouraged to have close outpatient follow-up with her doctor.  Lenise Arena MD    Note: This note was generated in part or whole with voice recognition software. Voice recognition is usually quite accurate but there are transcription errors that can and very often do occur. I apologize for any typographical errors that were not detected and corrected.     Earleen Newport, MD 11/06/19 2121

## 2019-11-06 NOTE — ED Notes (Signed)
Yvone Neu [phone] Ralph [facility] Rep would like to know when patient is being admitted/discharged

## 2019-11-06 NOTE — ED Notes (Signed)
Pt given crackers and coke. 

## 2019-11-08 LAB — URINE CULTURE: Culture: >=100000 — AB

## 2019-11-11 LAB — CULTURE, BLOOD (ROUTINE X 2)
Culture: NO GROWTH
Culture: NO GROWTH
Special Requests: ADEQUATE
Special Requests: ADEQUATE

## 2019-12-29 ENCOUNTER — Encounter: Payer: Self-pay | Admitting: Obstetrics and Gynecology

## 2019-12-29 ENCOUNTER — Ambulatory Visit (INDEPENDENT_AMBULATORY_CARE_PROVIDER_SITE_OTHER): Payer: Medicare Other | Admitting: Obstetrics and Gynecology

## 2019-12-29 ENCOUNTER — Other Ambulatory Visit: Payer: Self-pay

## 2019-12-29 VITALS — BP 108/64 | HR 82 | Ht 67.0 in | Wt 121.3 lb

## 2019-12-29 DIAGNOSIS — N9089 Other specified noninflammatory disorders of vulva and perineum: Secondary | ICD-10-CM | POA: Diagnosis not present

## 2019-12-29 NOTE — Progress Notes (Signed)
HPI:      [name] is a 64 y.o. G0P0000 who LMP was No LMP recorded. Patient is postmenopausal.  Subjective:   Patient is a mentally challenged woman who is unable to consent or submit to pelvic examination or Pap smear. She requires general anesthesia which has typically been done at [facility]. Previous vulvar biopsies at that time revealed vulvar irritation likely consistent with allergic type inflammation. She has chronic urine loss and wears "pull-ups" daily. No vaginal bleeding is reported. Her last Pap smear performed while under anesthesia was nondiagnostic with limited squamous cells noted. Of significant note patient has a remote history of breast cancer and is followed closely for this with mammography.    Hx: The following portions of the patient's history were reviewed and updated as appropriate:             She  has a past medical history of Anxiety, C. difficile enteritis, Cancer ([place]), Depression, Diabetes mellitus without complication ([place]), GERD (gastroesophageal reflux disease), Headache, Hyperlipidemia, Hypertension, Mental retardation, mild (I.Q. 50-70), Moderate intellectual disabilities, Obesity, Recurrent UTI, Tibial fracture, and Vitamin D deficiency. She does not have any pertinent problems on file. She  has a past surgical history that includes Breast surgery; Fracture surgery; Colon surgery; Appendectomy; Abdominal surgery; Dilation and curettage of uterus; Colonoscopy (N/A, 10/26/2014); and Esophagogastroduodenoscopy (egd) with propofol (N/A, 10/26/2014). Her family history includes Other in her father and mother. She  reports that she has never smoked. She has never used smokeless tobacco. She reports that she does not drink alcohol and does not use drugs. She has a current medication list which includes the following prescription(s): acetaminophen, alendronate, vitamin d3, clonazepam, clonazepam, divalproex, docusate sodium, duloxetine, duloxetine, ferrous gluconate,  folic acid, hydroxyzine, letrozole, pantoprazole, paroxetine, polyethylene glycol, quetiapine, quetiapine, and [DISCONTINUED] sitagliptin. She is allergic to pioglitazone.       Review of Systems:  Review of Systems  Constitutional: Denied constitutional symptoms, night sweats, recent illness, fatigue, fever, insomnia and weight loss.  Eyes: Denied eye symptoms, eye pain, photophobia, vision change and visual disturbance.  Ears/Nose/Throat/Neck: Denied ear, nose, throat or neck symptoms, hearing loss, nasal discharge, sinus congestion and sore throat.  Cardiovascular: Denied cardiovascular symptoms, arrhythmia, chest pain/pressure, edema, exercise intolerance, orthopnea and palpitations.  Respiratory: Denied pulmonary symptoms, asthma, pleuritic pain, productive sputum, cough, dyspnea and wheezing.  Gastrointestinal: Denied, gastro-esophageal reflux, melena, nausea and vomiting.  Genitourinary: Denied genitourinary symptoms including symptomatic vaginal discharge, pelvic relaxation issues, and urinary complaints.  Musculoskeletal: Denied musculoskeletal symptoms, stiffness, swelling, muscle weakness and myalgia.  Dermatologic: Denied dermatology symptoms, rash and scar.  Neurologic: Denied neurology symptoms, dizziness, headache, neck pain and syncope.  Psychiatric: Denied psychiatric symptoms, anxiety and depression.  Endocrine: Denied endocrine symptoms including hot flashes and night sweats.   Meds:   Current Outpatient Medications on File Prior to Visit  Medication Sig Dispense Refill  . acetaminophen (MAPAP) 500 MG tablet GIVE 2 TABLETS (1000 MG) BY MOUTH 3 TIMES DAILY FOR DISCOMFORT/PAIN    . alendronate (FOSAMAX) 70 MG tablet Take 70 mg by mouth once a week.    . Cholecalciferol (VITAMIN D3) 2000 UNITS TABS Take 2,000 Units by mouth once.    . clonazePAM (KLONOPIN) 0.5 MG tablet Take 0.5 mg by mouth as needed (agitation).     . clonazePAM (KLONOPIN) 1 MG tablet Take 1 mg by mouth 2  (two) times daily as needed.    . divalproex (DEPAKOTE SPRINKLE) 125 MG capsule Take 375 mg by mouth 3 (  three) times daily. 3 capsules in the morning, 2 capsules at noon, and 3 capsules at bedtime    . docusate sodium (COLACE) 100 MG capsule Take 100 mg by mouth 2 (two) times daily.    . DULoxetine (CYMBALTA) 30 MG capsule Take 30 mg by mouth daily. Take in addition to 60 mg for a total of 90 mg daily    . DULoxetine (CYMBALTA) 60 MG capsule Take 60 mg by mouth daily.     . ferrous gluconate (FERGON) 240 (27 FE) MG tablet Take 240 mg by mouth 3 (three) times daily with meals.    . folic acid (FOLVITE) 485 MCG tablet Take 400 mcg by mouth 2 (two) times daily.     . hydrOXYzine (ATARAX/VISTARIL) 25 MG tablet Take 25 mg by mouth 2 (two) times daily.    Marland Kitchen letrozole (FEMARA) 2.5 MG tablet Take 2.5 mg by mouth daily.    . pantoprazole (PROTONIX) 40 MG tablet Take 40 mg by mouth daily.     Marland Kitchen PARoxetine (PAXIL) 20 MG tablet Take 20 mg by mouth daily.    . polyethylene glycol (MIRALAX / GLYCOLAX) packet Take by mouth.    . QUEtiapine (SEROQUEL) 100 MG tablet Take 100 mg by mouth 2 (two) times daily.     . QUEtiapine (SEROQUEL) 300 MG tablet Take 300 mg by mouth at bedtime.    . [DISCONTINUED] sitaGLIPtin (JANUVIA) 100 MG tablet Take 100 mg by mouth daily.     No current facility-administered medications on file prior to visit.    Objective:     Vitals:   12/29/19 1110  BP: 108/64  Pulse: 82              External vulvar examination reveals some chronic appearing irritation. Some erythema. No ulceration, no visual evidence of lichen sclerosus.  Assessment:    G0P0000 Patient Active Problem List   Diagnosis Date Noted  . History of abnormal mammogram 01/28/2019  . Recurrent UTI 01/28/2019  . Chronic kidney disease (CKD), stage III (moderate) 11/11/2018  . Long-term use of high-risk medication 10/13/2018  . Anxiety 07/31/2017  . Anxiety and depression 10/16/2016  . GERD (gastroesophageal  reflux disease) 10/16/2016  . Hyperlipidemia associated with type 2 diabetes mellitus ([place]) 08/29/2016  . Moderate intellectual disabilities 11/15/2015  . IDA (iron deficiency anemia) 09/22/2014  . Closed fracture of distal end of left humerus 06/08/2014  . Headache 11/30/2013  . Breast cancer ([place]) 04/18/2013  . History of Clostridium difficile 10/30/2012  . Symptomatic cholelithiasis 10/26/2012  . Closed fracture of condyle of femur ([place]) 10/05/2012  . Closed fracture of proximal tibia 10/05/2012  . Cognitive deficits 10/05/2012  . Diabetes mellitus ([place]) 10/05/2012  . Hypertension associated with diabetes ([place]) 10/05/2012  . Hyperlipidemia, unspecified 10/05/2012  . Obesity 10/05/2012  . Vitamin D deficiency, unspecified 10/05/2012  . Urinary tract infection 10/05/2012  . Left tibial fracture 09/10/2012  . 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 Vitamin B12 deficiency 09/02/2000     1. Irritation of vulva     Likely consistent with previous biopsies performed at [facility].   Plan:            1. According to her accompanying caregiver patient has a scheduled appointment with another doctor at [facility] for possible pelvic exam Pap smear and pelvic care under general anesthesia. She has also been scheduled for a follow-up colonoscopy and mammography. I recommend that pelvic examination including Pap and any vulvar biopsies that  are warranted be performed while patient under general anesthesia with the above-noted exam. Orders No orders of the defined types were placed in this encounter.   No orders of the defined types were placed in this encounter.     F/U  Return in about 1 year (around 12/28/2020). I spent 21 minutes involved in the care of this patient preparing to see the patient by obtaining and reviewing her medical history (including labs, imaging tests and prior procedures), documenting clinical information in the electronic health record (EHR),  counseling and coordinating care plans, writing and sending prescriptions, ordering tests or procedures and directly communicating with the patient by discussing pertinent items from her history and physical exam as well as detailing my assessment and plan as noted above so that she has an informed understanding.  All of her questions were answered.  Finis Bud, M.D. 12/29/2019 11:35 AM

## 2020-01-11 DIAGNOSIS — R634 Abnormal weight loss: Secondary | ICD-10-CM | POA: Insufficient documentation

## 2020-01-19 ENCOUNTER — Other Ambulatory Visit
Admission: RE | Admit: 2020-01-19 | Discharge: 2020-01-19 | Disposition: A | Discharge status: Home / Self Care | Payer: Medicare Other | Source: Ambulatory Visit | Attending: General Surgery | Admitting: General Surgery

## 2020-01-19 ENCOUNTER — Other Ambulatory Visit: Payer: Self-pay

## 2020-01-19 DIAGNOSIS — Z01812 Encounter for preprocedural laboratory examination: Secondary | ICD-10-CM | POA: Diagnosis present

## 2020-01-19 DIAGNOSIS — Z20822 Contact with and (suspected) exposure to covid-19: Secondary | ICD-10-CM | POA: Diagnosis not present

## 2020-01-19 LAB — SARS CORONAVIRUS 2 (TAT 6-24 HRS): SARS Coronavirus 2: NEGATIVE

## 2020-01-19 NOTE — H&P (Signed)
PATIENT PROFILE: Carly Collins is a 64 y.o. female who presents for a visit to the GI clinic at [facility]  for a visit to follow up and discuss weight loss  PROBLEM LIST:     Patient Active Problem List  Diagnosis  . Urinary tract infection  . Tibial plateau fracture  . Femoral condyle fracture (CMS-HCC)  . Diabetes mellitus (CMS-HCC)  . Hypertension associated with diabetes (CMS-HCC)  . Hyperlipemia  . Vitamin D deficiency disease  . Cognitive deficits  . Obesity  . Symptomatic cholelithiasis  . Breast cancer (CMS-HCC)  . Severe major depression with psychotic features (CMS-HCC)  . Vitamin B12 deficiency  . Recurrent UTI  . Anxiety and depression  . Moderate intellectual disabilities  . Vitamin D deficiency  . Hyperlipidemia  . Hypertension  . Diabetes mellitus type 2, uncomplicated (CMS-HCC)  . Left tibial fracture  . GERD (gastroesophageal reflux disease)  . History of abnormal mammogram  . History of Clostridium difficile  . Headache  . Closed fracture of distal end of left humerus  . IDA (iron deficiency anemia)    INTERVAL HISTORY:  Carly Collins was last seen with Carly Collins due to history of MR on 12/01/14 for IDA in follow up of endoscopies done for same Had colonoscopy and EGD for luminal evaluation 10/26/14:  Colonoscopy revealed several adenomatous polyps- cecum/transverse colon/descending & sigmoid colon. There was a patent colonic anastomosis. 3y repeat recommended. It was noted she had a partial right colectomy due to large colon polyp in 2010- this was noted to be a sumbucosal lymphangioma without dysplasia/malignancy. EGD revealed minimal erosive gastritis. There was no H pylori, Barretts, dysplasia/malignancy. Labs that day with normal ferritin/iron panel. CBC with mild stable normocytic anemia, 10.8. this was an improvement from Carly prior hgb of 9.1. SBS was arranged in anticipation of VCE but patient did not have this done. She was to continue  Carly omeprazole for the gastritis. She and Carly Collins denied any further Gi concerns at last visit.  Since the last visit, has returned with Carly Collins- living now at [facility] Collins group home. They report she is feeling well generally but has significant problems with anxiety. Denies abdominal pain, dyspepsia, NVD, problems swallowing, bowel habit changes, melena/hematochezia, and all other GI related complaints.  They report Carly appetite is good although has lost about 100lb over the last year. They do report patient has problems with severe anxiety, follows with psych. They report in order to do imaging patient will need to be sedated and sometimes this does not work. Recent cbc with Hgb 10.8 (4/21), normocytic. crp normal (4.21). Bmp unremarkable (4/21). Liver enzymes & thyroid studies normal 2/21. A1C 5.2 (2/21). Had a FIT test ordered by PCP 9/20 with negative results. There was a mammogram 8/20. Last Pap/pelvc exam done at [facility] 3/19 under sedation as patient was unable to do unsedated in relation to anxiety. There were some vulvar lesions likely to be an eczematous dermatitis per path. Pap sample noted to be unsatisfactory. There was no GC/chlamydia.   Medications:  Encounter Medications        Outpatient Encounter Medications as of 11/01/2019  Medication Sig Dispense Refill  . alendronate (FOSAMAX) 70 MG tablet     . cholecalciferol (VITAMIN D3) 1000 unit tablet TAKE 1 TABLET BY MOUTH ONCE DAILY FOR SUPPLEMENT    . clonazePAM (KLONOPIN) 0.5 MG tablet Take 0.5 mg by mouth as directed for Anxiety (take 1 tablet in AM, 0.5 tablet at noon, 1  tablet QHS prn).    . clonazePAM (KLONOPIN) 1 MG tablet     . divalproex (DEPAKOTE SPRINKLE) 125 mg DR capsule     . docusate (COLACE) 100 MG capsule Take 100 mg by mouth 2 (two) times daily.    . DULoxetine (CYMBALTA) 30 MG DR capsule     . DULoxetine (CYMBALTA) 60 MG DR capsule duloxetine 60 mg capsule,delayed release    . ferrous  gluconate (FERGON) 240 (27 FE) MG tablet Take 240 mg by mouth 3 (three) times daily with meals.    . folic acid (FOLVITE) 025 MCG tablet Take 400 mcg by mouth once daily.    . hydrocortisone 1 % cream Apply topically    . hydrOXYzine (ATARAX) 25 MG tablet     . NYSTOP 100,000 unit/gram powder     . pantoprazole (PROTONIX) 40 MG DR tablet     . PARoxetine (PAXIL) 20 MG tablet     . polyethylene glycol (MIRALAX) packet Take 17 g by mouth nightly. Mix in 4-8ounces of fluid prior to taking.    Marland Kitchen QUEtiapine (SEROQUEL) 100 MG tablet Take 100 mg by mouth as directed (Take 1 tablet in AM, 1 tablet at noon, 1 tablet at 5pm, 3 tablets at bedtime). Reported on 08/09/2015     . QUEtiapine (SEROQUEL) 300 MG tablet     . triamcinolone 0.1 % cream triamcinolone acetonide 0.1 % topical cream    . [DISCONTINUED] acetaminophen (TYLENOL) 500 MG tablet Take 500 mg by mouth every 6 (six) hours as needed for Pain.    . [DISCONTINUED] amLODIPine (NORVASC) 5 MG tablet Take 5 mg by mouth daily.    . [DISCONTINUED] divalproex (DEPAKOTE) 250 MG DR tablet Take 250 mg by mouth 2 (two) times daily.    . [DISCONTINUED] insulin GLARGINE (LANTUS SOLOSTAR, BASAGLAR KWIKPEN) 100 unit/mL pen injector Inject 25 Units subcutaneously once daily.    . [DISCONTINUED] ketoconazole (NIZORAL) 2 % cream Apply topically once daily.    . [DISCONTINUED] letrozole (FEMARA) 2.5 mg tablet Take 1 tablet (2.5 mg total) by mouth once daily. 90 tablet 4  . [DISCONTINUED] lisinopril (PRINIVIL,ZESTRIL) 20 MG tablet Take 20 mg by mouth once daily.    . [DISCONTINUED] oxyCODONE 5 mg capsule Take 5 mg by mouth every 4 (four) hours as needed for Pain.    . [DISCONTINUED] sitaGLIPtin (JANUVIA) 100 MG tablet Take 100 mg by mouth once daily.    . [DISCONTINUED] traZODone (DESYREL) 50 MG tablet Take 50 mg by mouth nightly.    . [DISCONTINUED] zinc-lanolin (DERMACLOUD) Oint ointment Apply topically. Reported on  08/09/2015      No facility-administered encounter medications on file as of 11/01/2019.       ALLERGIES: Actos [pioglitazone]   PMHx:  Past Medical History:  Diagnosis Date  . Anxiety   . Anxiety and depression   . Breast cancer (CMS-HCC) 03/31/2013   Right breast-U/S guided bx w/path(+) 03/31/13  . Diabetes mellitus type 2, uncomplicated (CMS-HCC)   . Erosive gastropathy 10/26/2014  . Gastritis 10/26/2014  . GERD (gastroesophageal reflux disease) 4/23-4/29/2002   Inpatient MD dx notation  . Headache 11/30/2013   Patient with a several month history of HA, worsened by light and sound. CT done at [facility] was wnl.  . History of abnormal mammogram 03/04/2013, 03/26/2013   abnormal screening mammo 03/04/13,abnormal dx mammo 03/26/13  . History of Clostridium difficile 10/30/2012   c-diff (+)   . Hyperlipidemia   . Hyperplastic colon polyp 10/26/2014  . Hypertension   .  Left tibial fracture 09/2012   hx (L)tibial fx s/p fall ~10/03/12 w/ER admission 10/04/12  . Mental retardation, mild (I.Q. 50-70)   . Moderate intellectual disabilities   . Obesity   . Recurrent UTI   . Severe major depression with psychotic features (CMS-HCC) 09/02/2000   per inpatient psych MD note/dx  . Tubular adenoma of colon, unspecified 10/26/2014  . Vitamin B12 deficiency 09/02/2000   per MD note as inpatient: 4/23-4/29/2002  . Vitamin D deficiency      PSHx:      Past Surgical History:  Procedure Laterality Date  . ABDOMINAL SURGERY  09/08/1995   hx tubulovillous polyp w/severe dysplasia&focal intramucosal carcinoma:s/p colon resection/appendectomy  . APPENDECTOMY  09/08/1995   w/colon section  . BIOPSY/EXCISION LYMPH NODE AXILLARY Right 06/09/2013   Procedure: BIOPSY OR EXCISION OF LYMPH NODE(S); OPEN, DEEP AXILLARY NODE(S);  Surgeon: Joaquin Music, MD;  Location: [facility] OR;  Service: General Surgery;  Laterality: Right;  . BREAST BIOPSY    . BREAST LUMPECTOMY  2014   . CHOLECYSTECTOMY  10/27/2012   lap chole  . COLECTOMY PARTIAL W/ANASTAMOSIS Right 01/23/2009   colonoscopy w/colon mass--path report: "submucosal lymphangioma"  . COLON SURGERY     two partial colectomies for bening polyps  . COLONOSCOPY  10/26/2014   Tubular adenoma/Hyperplastic colon polyp/Repeat 60yrs/MUS  . COMBINED HYSTEROSCOPY DIAGNOSTIC / D&C  04/07/2007   hx abnormal vaginal bleeding  . EGD  10/26/2014   Gastritis/No Repeat/MUS  . MASTECTOMY PARTIAL Right 06/09/2013   Procedure: MASTECTOMY PARTIAL;  Surgeon: Joaquin Music, MD;  Location: [facility] OR;  Service: General Surgery;  Laterality: Right;  . Needle aspiration/drain of breast seroma Right 06/22/2013   (R)breast seroma drained @ Dr.Mackey's office  . REMOVAL SKIN TAGS CHEST Right 06/09/2013   Procedure: REMOVAL SKIN TAGS CHEST;  Surgeon: Joaquin Music, MD;  Location: [facility] OR;  Service: General Surgery;  Laterality: Right;  . repair of fractured tibia  2014     FAMILY HISTORY:      Family History  Problem Relation Age of Onset  . Cancer Maternal Grandfather 22       lung ca - nonsmoker  . No Known Problems Collins   . Colon polyps Father   . Breast cancer Neg Hx   . Ovarian cancer Neg Hx   . Colon cancer Neg Hx   . Liver disease Neg Hx   . Rectal cancer Neg Hx   . Ulcers Neg Hx      Social History: Social History          Socioeconomic History  . Marital status: Single    Spouse name: Not on file  . Number of children: Not on file  . Years of education: Not on file  . Highest education level: Not on file  Occupational History  . Not on file  Tobacco Use  . Smoking status: Never Smoker  . Smokeless tobacco: Never Used  Substance and Sexual Activity  . Alcohol use: No  . Drug use: No  . Sexual activity: Never  Other Topics Concern  . Not on file  Social History Narrative   HCPOA/POA: Shiryl Ruddy (pt's Collins) [phone] or [phone]   Social  Determinants of Health      Financial Resource Strain:   . Difficulty of Paying Living Expenses:   Food Insecurity:   . Worried About Charity fundraiser in the Last Year:   . [place] in the Last Year:  Transportation Needs:   . Film/video editor (Medical):   Marland Kitchen Lack of Transportation (Non-Medical):        REVIEW OF SYSTEMS:   10 systems reviewed, unremarkable other than what is written above, with the exception of falls, cognitive delay relating to Carly MR   PHYSICAL EXAM:     Vitals:   11/01/19 1019  BP: 130/74  Pulse: 64  Temp: 36.6 C (97.8 F)   Body mass index is 22.13 kg/m. Weight: 54.9 kg (121 lb)   General Appearance:    Alert, cooperative, no distress, appears stated age Head:     Atraumatic, normocephalic Eyes:     Anciteric, conjunctiva pink. Mouth: no oral ulcers Lungs:               Respirations eupneic.  Clear to auscultation bilaterally  Heart:               Regular rate and rhythm, S1 and S2 normal, no murmur, rub   or gallop Abdomen:          Flat, bowel sounds x 4.Soft, non-tender/nondistended. No guarding, rigidity, rebound tenderness or other peritoneal signs Extremities:       No cyanosis or edema, moves all extremities well. Strength 5/5. Skin:      Skin color, texture, turgor normal, no rashes or lesions Neuro: alert, oriented x 3, cranial nerves II-XII intact Psych: pleasant, calm, cooperative. Anxious with exam but then calmed  REVIEW OF DATA: I have reviewed the following data today: Prior notes, labs, PCP noted, gyn notes.    ASSESSMENT AND PLAN:   1. Weight loss, history of adenomatous polyps. Schedule EGd and colonoscopy.  Procedure information given: indications, benefits, risks- including, but not limited to bleeding, infection, perforation, difficulty with sedation, were discussed with the patient, and they are agreeable to the procedure. Considering imaging like CT abdomen/pelvis- this may be difficult  given Carly anxiety. Recommend she has another gyn eval/pap with that provider. 2. Follow up 2-3w after procedures, sooner if problems Collins reports that patient's brother is now POA.    Electronically signed by Ok Edwards, NP on 11/01/2019 1:11 PM

## 2020-01-21 ENCOUNTER — Ambulatory Visit
Admission: RE | Admit: 2020-01-21 | Discharge: 2020-01-21 | Disposition: A | Discharge status: Home / Self Care | Payer: Medicare Other | Attending: General Surgery | Admitting: General Surgery

## 2020-01-21 ENCOUNTER — Encounter
Admission: RE | Disposition: A | Discharge status: Home / Self Care | Payer: Self-pay | Source: Home / Self Care | Attending: General Surgery

## 2020-01-21 ENCOUNTER — Ambulatory Visit: Payer: Medicare Other | Admitting: Anesthesiology

## 2020-01-21 DIAGNOSIS — F329 Major depressive disorder, single episode, unspecified: Secondary | ICD-10-CM | POA: Diagnosis not present

## 2020-01-21 DIAGNOSIS — Z7983 Long term (current) use of bisphosphonates: Secondary | ICD-10-CM | POA: Insufficient documentation

## 2020-01-21 DIAGNOSIS — K317 Polyp of stomach and duodenum: Secondary | ICD-10-CM | POA: Insufficient documentation

## 2020-01-21 DIAGNOSIS — E669 Obesity, unspecified: Secondary | ICD-10-CM | POA: Diagnosis not present

## 2020-01-21 DIAGNOSIS — Z1211 Encounter for screening for malignant neoplasm of colon: Secondary | ICD-10-CM | POA: Insufficient documentation

## 2020-01-21 DIAGNOSIS — E559 Vitamin D deficiency, unspecified: Secondary | ICD-10-CM | POA: Diagnosis not present

## 2020-01-21 DIAGNOSIS — D509 Iron deficiency anemia, unspecified: Secondary | ICD-10-CM | POA: Insufficient documentation

## 2020-01-21 DIAGNOSIS — E785 Hyperlipidemia, unspecified: Secondary | ICD-10-CM | POA: Insufficient documentation

## 2020-01-21 DIAGNOSIS — K219 Gastro-esophageal reflux disease without esophagitis: Secondary | ICD-10-CM | POA: Diagnosis not present

## 2020-01-21 DIAGNOSIS — Z8719 Personal history of other diseases of the digestive system: Secondary | ICD-10-CM | POA: Diagnosis not present

## 2020-01-21 DIAGNOSIS — E1159 Type 2 diabetes mellitus with other circulatory complications: Secondary | ICD-10-CM | POA: Diagnosis not present

## 2020-01-21 DIAGNOSIS — Z853 Personal history of malignant neoplasm of breast: Secondary | ICD-10-CM | POA: Insufficient documentation

## 2020-01-21 DIAGNOSIS — F70 Mild intellectual disabilities: Secondary | ICD-10-CM | POA: Diagnosis not present

## 2020-01-21 DIAGNOSIS — Z79899 Other long term (current) drug therapy: Secondary | ICD-10-CM | POA: Insufficient documentation

## 2020-01-21 DIAGNOSIS — F419 Anxiety disorder, unspecified: Secondary | ICD-10-CM | POA: Diagnosis not present

## 2020-01-21 DIAGNOSIS — I10 Essential (primary) hypertension: Secondary | ICD-10-CM | POA: Diagnosis not present

## 2020-01-21 HISTORY — PX: ESOPHAGOGASTRODUODENOSCOPY: SHX5428

## 2020-01-21 HISTORY — PX: COLONOSCOPY: SHX5424

## 2020-01-21 SURGERY — EGD (ESOPHAGOGASTRODUODENOSCOPY)
Anesthesia: General

## 2020-01-21 MED ORDER — PROPOFOL 500 MG/50ML IV EMUL
INTRAVENOUS | Status: AC
  Filled 2020-01-21: qty 50

## 2020-01-21 MED ORDER — PROPOFOL 10 MG/ML IV BOLUS
INTRAVENOUS | Status: AC
  Filled 2020-01-21: qty 20

## 2020-01-21 MED ORDER — LIDOCAINE HCL (CARDIAC) PF 100 MG/5ML IV SOSY
PREFILLED_SYRINGE | INTRAVENOUS | Status: DC | PRN
  Administered 2020-01-21: 30 mg via INTRAVENOUS

## 2020-01-21 MED ORDER — PROPOFOL 10 MG/ML IV BOLUS
INTRAVENOUS | Status: DC | PRN
  Administered 2020-01-21: 30 mg via INTRAVENOUS
  Administered 2020-01-21: 20 mg via INTRAVENOUS

## 2020-01-21 MED ORDER — LIDOCAINE HCL (PF) 2 % IJ SOLN
INTRAMUSCULAR | Status: AC
  Filled 2020-01-21: qty 5

## 2020-01-21 MED ORDER — SODIUM CHLORIDE 0.9 % IV SOLN: INTRAVENOUS | Status: DC

## 2020-01-21 MED ORDER — PROPOFOL 500 MG/50ML IV EMUL
INTRAVENOUS | Status: DC | PRN
  Administered 2020-01-21: 125 ug/kg/min via INTRAVENOUS

## 2020-01-21 MED ORDER — SODIUM CHLORIDE 0.9 % IV SOLN
INTRAVENOUS | Status: DC
  Administered 2020-01-21: 20 mL/h via INTRAVENOUS

## 2020-01-21 NOTE — Transfer of Care (Signed)
Immediate Anesthesia Transfer of Care Note  Patient: Carly Collins  Procedure(s) Performed: ESOPHAGOGASTRODUODENOSCOPY (EGD) (N/A ) COLONOSCOPY (N/A )  Patient Location: PACU and Endoscopy Unit  Anesthesia Type:MAC  Level of Consciousness: awake  Airway & Oxygen Therapy: Patient Spontanous Breathing  Post-op Assessment: Report given to RN and Post -op Vital signs reviewed and stable  Post vital signs: Reviewed and stable  Last Vitals:  Vitals Value Taken Time  BP    Temp    Pulse    Resp    SpO2      Last Pain: There were no vitals filed for this visit.       Complications: No complications documented.

## 2020-01-21 NOTE — Anesthesia Preprocedure Evaluation (Signed)
Anesthesia Evaluation  Patient identified by MRN, date of birth, ID band Patient awake  General Assessment Comment:Hx reviewed with pt's caretaker   Reviewed: Allergy & Precautions, NPO status , Patient's Chart, lab work & pertinent test results  History of Anesthesia Complications Negative for: history of anesthetic complications  Airway Mallampati: II  TM Distance: >3 FB Neck ROM: Full    Dental  (+) Poor Dentition   Pulmonary neg pulmonary ROS, neg sleep apnea, neg COPD,    breath sounds clear to auscultation- rhonchi (-) wheezing      Cardiovascular hypertension, Pt. on medications (-) CAD, (-) Past MI, (-) Cardiac Stents and (-) CABG  Rhythm:Regular Rate:Normal - Systolic murmurs and - Diastolic murmurs    Neuro/Psych  Headaches, neg Seizures PSYCHIATRIC DISORDERS Anxiety Depression    GI/Hepatic Neg liver ROS, GERD  ,  Endo/Other  diabetes  Renal/GU Renal InsufficiencyRenal disease     Musculoskeletal negative musculoskeletal ROS (+)   Abdominal (+) - obese,   Peds  Hematology  (+) anemia ,   Anesthesia Other Findings Past Medical History: No date: Anxiety No date: C. difficile enteritis No date: Cancer ([place]) No date: Depression No date: Diabetes mellitus without complication (HCC) No date: GERD (gastroesophageal reflux disease) No date: Headache No date: Hyperlipidemia No date: Hypertension No date: Mental retardation, mild (I.Q. 50-70) No date: Moderate intellectual disabilities No date: Obesity No date: Recurrent UTI No date: Tibial fracture No date: Vitamin D deficiency   Reproductive/Obstetrics                             Anesthesia Physical Anesthesia Plan  ASA: III  Anesthesia Plan: General   Post-op Pain Management:    Induction: Intravenous  PONV Risk Score and Plan: 2 and Propofol infusion  Airway Management Planned: Natural Airway  Additional  Equipment:   Intra-op Plan:   Post-operative Plan:   Informed Consent: I have reviewed the patients History and Physical, chart, labs and discussed the procedure including the risks, benefits and alternatives for the proposed anesthesia with the patient or authorized representative who has indicated his/her understanding and acceptance.     Dental advisory given and Consent reviewed with POA (phone consent from pt's brother and legal guardian)  Plan Discussed with: CRNA and Anesthesiologist  Anesthesia Plan Comments:         Anesthesia Quick Evaluation

## 2020-01-21 NOTE — Op Note (Signed)
[facility] Gastroenterology Patient Name: Carly Collins Procedure Date: 01/21/2020 10:18 AM MRN: [id number] Account #: [id number] Date of Birth: 02-16-1956 Admit Type: Outpatient Age: 64 Room: [facility] ENDO ROOM 1 Gender: Female Note Status: Finalized Procedure:             Colonoscopy Indications:           High risk colon cancer surveillance: Personal history                         of colonic polyps, Incidental - Iron deficiency anemia Providers:             Robert Bellow, MD Referring MD:          No Local Md, MD (Referring MD) Medicines:             Monitored Anesthesia Care Complications:         No immediate complications. Procedure:             Pre-Anesthesia Assessment:                        - Prior to the procedure, a History and Physical was                         performed, and patient medications, allergies and                         sensitivities were reviewed. The patient's tolerance                         of previous anesthesia was reviewed.                        - The risks and benefits of the procedure and the                         sedation options and risks were discussed with the                         patient. All questions were answered and informed                         consent was obtained.                        After obtaining informed consent, the colonoscope was                         passed under direct vision. Throughout the procedure,                         the patient's blood pressure, pulse, and oxygen                         saturations were monitored continuously. The                         Colonoscope was introduced through the anus and  advanced to the the ileocolonic anastomosis. The                         colonoscopy was somewhat difficult due to a tortuous                         colon. Successful completion of the procedure was                         aided by using manual pressure. The  patient tolerated                         the procedure well. The quality of the bowel                         preparation was adequate to identify polyps. Findings:      The entire examined colon appeared normal on direct and retroflexion       views. Impression:            - The entire examined colon is normal on direct and                         retroflexion views.                        - No specimens collected. Recommendation:        - Repeat colonoscopy in 5 years for surveillance. Procedure Code(s):     --- Professional ---                        [phone], Colonoscopy, flexible; diagnostic, including                         collection of specimen(s) by brushing or washing, when                         performed (separate procedure) Diagnosis Code(s):     --- Professional ---                        Z86.010, Personal history of colonic polyps CPT copyright 2019 American Medical Association. All rights reserved. The codes documented in this report are preliminary and upon coder review may  be revised to meet current compliance requirements. Robert Bellow, MD 01/21/2020 11:39:59 AM This report has been signed electronically. Number of Addenda: 0 Note Initiated On: 01/21/2020 10:18 AM Scope Withdrawal Time: 0 hours 12 minutes 1 second  Total Procedure Duration: 0 hours 37 minutes 37 seconds  Estimated Blood Loss:  Estimated blood loss: none.      [facility]

## 2020-01-21 NOTE — Anesthesia Procedure Notes (Signed)
Performed by: Tamla Winkels M, CRNA       

## 2020-01-21 NOTE — Anesthesia Postprocedure Evaluation (Signed)
Anesthesia Post Note  Patient: Carly Collins  Procedure(s) Performed: ESOPHAGOGASTRODUODENOSCOPY (EGD) (N/A ) COLONOSCOPY (N/A )  Patient location during evaluation: Endoscopy Anesthesia Type: General Level of consciousness: awake and alert Pain management: pain level controlled Vital Signs Assessment: post-procedure vital signs reviewed and stable Respiratory status: spontaneous breathing, nonlabored ventilation and respiratory function stable Cardiovascular status: blood pressure returned to baseline and stable Postop Assessment: no signs of nausea or vomiting Anesthetic complications: no   No complications documented.   Last Vitals:  Vitals:   01/21/20 1204 01/21/20 1214  BP: (!) 133/91 (!) 133/91  Pulse:    Resp:  12  SpO2: 98% 95%    Last Pain: There were no vitals filed for this visit.               Ifeoluwa Bartz

## 2020-01-21 NOTE — Op Note (Signed)
[facility] Gastroenterology Patient Name: Carly Collins Procedure Date: 01/21/2020 10:19 AM MRN: [id number] Account #: [id number] Date of Birth: [birth year] Admit Type: Outpatient Age: 64 Room: [facility] ENDO ROOM 1 Gender: Female Note Status: Finalized Procedure:             Upper GI endoscopy Indications:           Iron deficiency anemia Providers:             Robert Bellow, MD Referring MD:          No Local Md, MD (Referring MD) Medicines:             Monitored Anesthesia Care Complications:         No immediate complications. Procedure:             Pre-Anesthesia Assessment:                        - Prior to the procedure, a History and Physical was                         performed, and patient medications, allergies and                         sensitivities were reviewed. The patient's tolerance                         of previous anesthesia was reviewed.                        - The risks and benefits of the procedure and the                         sedation options and risks were discussed with the                         patient. All questions were answered and informed                         consent was obtained.                        After obtaining informed consent, the endoscope was                         passed under direct vision. Throughout the procedure,                         the patient's blood pressure, pulse, and oxygen                         saturations were monitored continuously. The Endoscope                         was introduced through the mouth, and advanced to the                         second part of duodenum. The upper GI endoscopy was  accomplished without difficulty. The patient tolerated                         the procedure well. Findings:      The esophagus was normal.      The examined duodenum was normal.      Five 5 to 10 mm sessile polyps with no bleeding and no stigmata of       recent  bleeding were found in the gastric antrum. Biopsies were taken       with a cold forceps for histology. [place] submucosal. 4/5 biopsies,       largest with multiple pieces obtained. Impression:            - Normal esophagus.                        - Normal examined duodenum.                        - Five gastric polyps. Biopsied. Recommendation:        - Perform a colonoscopy today. Procedure Code(s):     --- Professional ---                        [phone], Esophagogastroduodenoscopy, flexible,                         transoral; with biopsy, single or multiple Diagnosis Code(s):     --- Professional ---                        K31.7, Polyp of stomach and duodenum                        D50.9, Iron deficiency anemia, unspecified CPT copyright 2019 American Medical Association. All rights reserved. The codes documented in this report are preliminary and upon coder review may  be revised to meet current compliance requirements. Robert Bellow, MD 01/21/2020 10:55:04 AM This report has been signed electronically. Number of Addenda: 0 Note Initiated On: 01/21/2020 10:19 AM Estimated Blood Loss:  Estimated blood loss: none.      [facility]

## 2020-01-21 NOTE — H&P (Signed)
Carly Collins [id number] 08-Nov-1955     HPI:  64 y/o mentally challenge woman with history of right colectomy for polyps, tubular adenomas at time of 2016 colonoscopy and long standing iron deficiency anemia.  Plan is for luminal evaluation today.  Medications Prior to Admission  Medication Sig Dispense Refill Last Dose  . acetaminophen (MAPAP) 500 MG tablet GIVE 2 TABLETS (1000 MG) BY MOUTH 3 TIMES DAILY FOR DISCOMFORT/PAIN   01/20/2020 at Unknown time  . alendronate (FOSAMAX) 70 MG tablet Take 70 mg by mouth once a week.   01/20/2020 at Unknown time  . Cholecalciferol (VITAMIN D3) 2000 UNITS TABS Take 2,000 Units by mouth once.   Past Week at Unknown time  . clonazePAM (KLONOPIN) 0.5 MG tablet Take 0.5 mg by mouth as needed (agitation).    Past Week at Unknown time  . clonazePAM (KLONOPIN) 1 MG tablet Take 1 mg by mouth 2 (two) times daily as needed.   Past Week at Unknown time  . divalproex (DEPAKOTE SPRINKLE) 125 MG capsule Take 375 mg by mouth 3 (three) times daily. 3 capsules in the morning, 2 capsules at noon, and 3 capsules at bedtime   01/20/2020 at Unknown time  . docusate sodium (COLACE) 100 MG capsule Take 100 mg by mouth 2 (two) times daily.   Past Week at Unknown time  . DULoxetine (CYMBALTA) 30 MG capsule Take 30 mg by mouth daily. Take in addition to 60 mg for a total of 90 mg daily   01/20/2020 at Unknown time  . DULoxetine (CYMBALTA) 60 MG capsule Take 60 mg by mouth daily.    01/20/2020 at Unknown time  . ferrous gluconate (FERGON) 240 (27 FE) MG tablet Take 240 mg by mouth 3 (three) times daily with meals.   Past Week at Unknown time  . folic acid (FOLVITE) 536 MCG tablet Take 400 mcg by mouth 2 (two) times daily.    Past Week at Unknown time  . hydrOXYzine (ATARAX/VISTARIL) 25 MG tablet Take 25 mg by mouth 2 (two) times daily.   Past Week at Unknown time  . letrozole (FEMARA) 2.5 MG tablet Take 2.5 mg by mouth daily.   Past Week at Unknown time  . pantoprazole (PROTONIX) 40 MG tablet  Take 40 mg by mouth daily.    Past Week at Unknown time  . PARoxetine (PAXIL) 20 MG tablet Take 20 mg by mouth daily.   Past Week at Unknown time  . polyethylene glycol (MIRALAX / GLYCOLAX) packet Take by mouth.   01/20/2020 at Unknown time  . QUEtiapine (SEROQUEL) 100 MG tablet Take 100 mg by mouth 2 (two) times daily.    Past Week at Unknown time  . QUEtiapine (SEROQUEL) 300 MG tablet Take 300 mg by mouth at bedtime.   Past Week at Unknown time   Allergies  Allergen Reactions  . Pioglitazone Other (See Comments)    unknown Unknown to patient    Past Medical History:  Diagnosis Date  . Anxiety   . C. difficile enteritis   . Cancer ([place])   . Depression   . Diabetes mellitus without complication ([place])   . GERD (gastroesophageal reflux disease)   . Headache   . Hyperlipidemia   . Hypertension   . Mental retardation, mild (I.Q. 50-70)   . Moderate intellectual disabilities   . Obesity   . Recurrent UTI   . Tibial fracture   . Vitamin D deficiency    Past Surgical History:  Procedure Laterality Date  .  ABDOMINAL SURGERY    . APPENDECTOMY    . BREAST SURGERY    . COLON SURGERY    . COLONOSCOPY N/A 10/26/2014   Procedure: COLONOSCOPY;  Surgeon: Lollie Sails, MD;  Location[facility] ENDOSCOPY;  Service: Endoscopy;  Laterality: N/A;  . DILATION AND CURETTAGE OF UTERUS    . ESOPHAGOGASTRODUODENOSCOPY (EGD) WITH PROPOFOL N/A 10/26/2014   Procedure: ESOPHAGOGASTRODUODENOSCOPY (EGD) WITH PROPOFOL;  Surgeon: Lollie Sails, MD;  Location: [facility] ENDOSCOPY;  Service: Endoscopy;  Laterality: N/A;  . FRACTURE SURGERY     Social History   Socioeconomic History  . Marital status: Single    Spouse name: Not on file  . Number of children: Not on file  . Years of education: Not on file  . Highest education level: Not on file  Occupational History  . Not on file  Tobacco Use  . Smoking status: Never Smoker  . Smokeless tobacco: Never Used  Vaping Use  . Vaping Use: Never used   Substance and Sexual Activity  . Alcohol use: No  . Drug use: No  . Sexual activity: Not Currently  Other Topics Concern  . Not on file  Social History Narrative  . Not on file   Social Determinants of Health   Financial Resource Strain:   . Difficulty of Paying Living Expenses: Not on file  Food Insecurity:   . Worried About Charity fundraiser in the Last Year: Not on file  . Ran Out of Food in the Last Year: Not on file  Transportation Needs:   . Lack of Transportation (Medical): Not on file  . Lack of Transportation (Non-Medical): Not on file  Physical Activity:   . Days of Exercise per Week: Not on file  . Minutes of Exercise per Session: Not on file  Stress:   . Feeling of Stress : Not on file  Social Connections:   . Frequency of Communication with Friends and Family: Not on file  . Frequency of Social Gatherings with Friends and Family: Not on file  . Attends Religious Services: Not on file  . Active Member of Clubs or Organizations: Not on file  . Attends Archivist Meetings: Not on file  . Marital Status: Not on file  Intimate Partner Violence:   . Fear of Current or Ex-Partner: Not on file  . Emotionally Abused: Not on file  . Physically Abused: Not on file  . Sexually Abused: Not on file   Social History   Social History Narrative  . Not on file     ROS: Negative.     PE: HEENT: Negative. Lungs: Clear. Cardio: RR.  Assessment/Plan:  Proceed with planned endoscopy.  Awaiting consent from her legal guardian (brother).    Forest Gleason [facility] 01/21/2020

## 2020-01-21 NOTE — Anesthesia Procedure Notes (Signed)
Procedure Name: MAC Date/Time: 01/21/2020 10:36 AM Performed by: Genevie Ann, CRNA Pre-anesthesia Checklist: Patient identified, Emergency Drugs available, Suction available, Patient being monitored and Timeout performed Patient Re-evaluated:Patient Re-evaluated prior to induction

## 2020-01-23 NOTE — Progress Notes (Signed)
Voicemail has not been set up recording.

## 2020-01-24 ENCOUNTER — Encounter: Payer: Self-pay | Admitting: General Surgery

## 2020-01-27 LAB — SURGICAL PATHOLOGY

## 2020-02-14 ENCOUNTER — Other Ambulatory Visit (HOSPITAL_COMMUNITY): Payer: Self-pay | Admitting: Gastroenterology

## 2020-02-14 ENCOUNTER — Other Ambulatory Visit: Payer: Self-pay | Admitting: Gastroenterology

## 2020-02-14 DIAGNOSIS — R634 Abnormal weight loss: Secondary | ICD-10-CM

## 2020-02-14 DIAGNOSIS — D509 Iron deficiency anemia, unspecified: Secondary | ICD-10-CM

## 2020-02-15 ENCOUNTER — Other Ambulatory Visit: Payer: Self-pay | Admitting: Gastroenterology

## 2020-02-15 DIAGNOSIS — D509 Iron deficiency anemia, unspecified: Secondary | ICD-10-CM

## 2020-02-15 DIAGNOSIS — R634 Abnormal weight loss: Secondary | ICD-10-CM

## 2020-02-21 ENCOUNTER — Ambulatory Visit: Payer: Medicare Other

## 2020-03-06 ENCOUNTER — Other Ambulatory Visit: Payer: Medicare Other

## 2020-03-07 ENCOUNTER — Ambulatory Visit
Admission: RE | Admit: 2020-03-07 | Discharge: 2020-03-07 | Disposition: A | Discharge status: Home / Self Care | Payer: Medicare Other | Source: Ambulatory Visit | Attending: Gastroenterology | Admitting: Gastroenterology

## 2020-03-07 ENCOUNTER — Other Ambulatory Visit: Payer: Self-pay

## 2020-03-07 DIAGNOSIS — D509 Iron deficiency anemia, unspecified: Secondary | ICD-10-CM | POA: Diagnosis not present

## 2020-03-07 DIAGNOSIS — R634 Abnormal weight loss: Secondary | ICD-10-CM | POA: Diagnosis present

## 2020-12-14 ENCOUNTER — Other Ambulatory Visit: Payer: Self-pay

## 2020-12-14 ENCOUNTER — Encounter: Payer: Self-pay | Admitting: Podiatry

## 2020-12-14 ENCOUNTER — Ambulatory Visit (INDEPENDENT_AMBULATORY_CARE_PROVIDER_SITE_OTHER): Payer: Medicare Other | Admitting: Podiatry

## 2020-12-14 DIAGNOSIS — M79674 Pain in right toe(s): Secondary | ICD-10-CM

## 2020-12-14 DIAGNOSIS — N183 Chronic kidney disease, stage 3 unspecified: Secondary | ICD-10-CM | POA: Diagnosis not present

## 2020-12-14 DIAGNOSIS — M79675 Pain in left toe(s): Secondary | ICD-10-CM

## 2020-12-14 DIAGNOSIS — L603 Nail dystrophy: Secondary | ICD-10-CM | POA: Diagnosis not present

## 2020-12-14 DIAGNOSIS — B351 Tinea unguium: Secondary | ICD-10-CM | POA: Diagnosis not present

## 2020-12-14 NOTE — Progress Notes (Signed)
This patient returns to my office for at risk foot care.  This patient requires this care by a professional since this patient will be at risk due to having  CKD, diabetes and coagulation defect.  This patient is unable to cut nails herself since the patient cannot reach hernails.These nails are painful walking and wearing shoes.  This patient presents for at risk foot care today. She presents to the office with her daughter.  General Appearance  Alert but not conversant  Vascular  Dorsalis pedis and posterior tibial  pulses are palpable  bilaterally.  Capillary return is within normal limits  bilaterally. Temperature is within normal limits  bilaterally.  Neurologic  Senn-Weinstein monofilament wire test within normal limits  bilaterally. Muscle power within normal limits bilaterally.  Nails Thick disfigured discolored nails with subungual debris  hallux nails bilaterally. No evidence of bacterial infection or drainage bilaterally.  Orthopedic  No limitations of motion  feet .  No crepitus or effusions noted.  No bony pathology or digital deformities noted.  Skin  normotropic skin with no porokeratosis noted bilaterally.  No signs of infections or ulcers noted.     Onychomycosis  Pain in right toes  Pain in left toes  Consent was obtained for treatment procedures.   Mechanical debridement of nails 1-5  bilaterally performed with a nail nipper.  Filed with dremel without incident.    Return office visit   prn                  Told patient to return for periodic foot care and evaluation due to potential at risk complications. No filing was performed since I was concerned how she would have handled the filing.   Gardiner Barefoot DPM

## 2021-01-26 ENCOUNTER — Ambulatory Visit
Admission: RE | Admit: 2021-01-26 | Discharge: 2021-01-26 | Disposition: A | Discharge status: Home / Self Care | Payer: Medicare Other | Source: Ambulatory Visit | Attending: Internal Medicine | Admitting: Internal Medicine

## 2021-01-26 ENCOUNTER — Ambulatory Visit
Admission: RE | Admit: 2021-01-26 | Discharge: 2021-01-26 | Disposition: A | Discharge status: Home / Self Care | Payer: Medicare Other | Attending: Internal Medicine | Admitting: Internal Medicine

## 2021-01-26 ENCOUNTER — Other Ambulatory Visit: Payer: Self-pay | Admitting: Internal Medicine

## 2021-01-26 DIAGNOSIS — T17928A Food in respiratory tract, part unspecified causing other injury, initial encounter: Secondary | ICD-10-CM

## 2021-07-03 DIAGNOSIS — R0989 Other specified symptoms and signs involving the circulatory and respiratory systems: Secondary | ICD-10-CM | POA: Insufficient documentation

## 2021-08-15 DIAGNOSIS — B372 Candidiasis of skin and nail: Secondary | ICD-10-CM | POA: Insufficient documentation

## 2022-01-08 DIAGNOSIS — B3731 Acute candidiasis of vulva and vagina: Secondary | ICD-10-CM | POA: Insufficient documentation

## 2022-01-25 ENCOUNTER — Ambulatory Visit: Payer: Medicare Other | Admitting: Podiatry

## 2022-02-07 ENCOUNTER — Encounter: Payer: Self-pay | Admitting: Podiatry

## 2022-02-07 ENCOUNTER — Ambulatory Visit (INDEPENDENT_AMBULATORY_CARE_PROVIDER_SITE_OTHER): Payer: Medicare Other | Admitting: Podiatry

## 2022-02-07 DIAGNOSIS — B351 Tinea unguium: Secondary | ICD-10-CM

## 2022-02-07 DIAGNOSIS — N183 Chronic kidney disease, stage 3 unspecified: Secondary | ICD-10-CM | POA: Diagnosis not present

## 2022-02-07 DIAGNOSIS — M79675 Pain in left toe(s): Secondary | ICD-10-CM | POA: Diagnosis not present

## 2022-02-07 DIAGNOSIS — M79674 Pain in right toe(s): Secondary | ICD-10-CM

## 2022-02-07 NOTE — Progress Notes (Signed)
This patient returns to my office for at risk foot care.  This patient requires this care by a professional since this patient will be at risk due to having  CKD, diabetes and coagulation defect.  This patient is unable to cut nails herself since the patient cannot reach hernails.These nails are painful walking and wearing shoes.  This patient presents for at risk foot care today. She presents to the office with her daughter.  General Appearance  Alert but not conversant  Vascular  Dorsalis pedis and posterior tibial  pulses are palpable  bilaterally.  Capillary return is within normal limits  bilaterally. Temperature is within normal limits  bilaterally.  Neurologic  Senn-Weinstein monofilament wire test within normal limits  bilaterally. Muscle power within normal limits bilaterally.  Nails Thick disfigured discolored nails with subungual debris  hallux nails bilaterally. No evidence of bacterial infection or drainage bilaterally.  Orthopedic  No limitations of motion  feet .  No crepitus or effusions noted.  No bony pathology or digital deformities noted.  Skin  normotropic skin with no porokeratosis noted bilaterally.  No signs of infections or ulcers noted.     Onychomycosis  Pain in right toes  Pain in left toes  Consent was obtained for treatment procedures.   Mechanical debridement of nails 1-5  bilaterally performed with a nail nipper.  Filed with dremel without incident.    Return office visit   prn                  Told patient to return for periodic foot care and evaluation due to potential at risk complications.   Gardiner Barefoot DPM

## 2022-02-11 DIAGNOSIS — U071 COVID-19: Secondary | ICD-10-CM | POA: Diagnosis present

## 2022-02-13 ENCOUNTER — Inpatient Hospital Stay
Admission: EM | Admit: 2022-02-13 | Discharge: 2022-02-22 | DRG: 480 | Disposition: A | Discharge status: Skilled Nursing Facility | Payer: Medicare Other | Attending: Internal Medicine | Admitting: Internal Medicine

## 2022-02-13 ENCOUNTER — Other Ambulatory Visit: Payer: Self-pay

## 2022-02-13 ENCOUNTER — Emergency Department: Payer: Medicare Other

## 2022-02-13 DIAGNOSIS — L304 Erythema intertrigo: Secondary | ICD-10-CM | POA: Diagnosis present

## 2022-02-13 DIAGNOSIS — Y92199 Unspecified place in other specified residential institution as the place of occurrence of the external cause: Secondary | ICD-10-CM

## 2022-02-13 DIAGNOSIS — I10 Essential (primary) hypertension: Secondary | ICD-10-CM | POA: Diagnosis not present

## 2022-02-13 DIAGNOSIS — F419 Anxiety disorder, unspecified: Secondary | ICD-10-CM | POA: Diagnosis present

## 2022-02-13 DIAGNOSIS — C50919 Malignant neoplasm of unspecified site of unspecified female breast: Secondary | ICD-10-CM | POA: Diagnosis present

## 2022-02-13 DIAGNOSIS — R4189 Other symptoms and signs involving cognitive functions and awareness: Secondary | ICD-10-CM | POA: Diagnosis present

## 2022-02-13 DIAGNOSIS — F32A Depression, unspecified: Secondary | ICD-10-CM | POA: Diagnosis present

## 2022-02-13 DIAGNOSIS — D509 Iron deficiency anemia, unspecified: Secondary | ICD-10-CM | POA: Diagnosis present

## 2022-02-13 DIAGNOSIS — S72452A Displaced supracondylar fracture without intracondylar extension of lower end of left femur, initial encounter for closed fracture: Principal | ICD-10-CM | POA: Diagnosis present

## 2022-02-13 DIAGNOSIS — R4182 Altered mental status, unspecified: Principal | ICD-10-CM

## 2022-02-13 DIAGNOSIS — N390 Urinary tract infection, site not specified: Secondary | ICD-10-CM | POA: Diagnosis not present

## 2022-02-13 DIAGNOSIS — Z8616 Personal history of COVID-19: Secondary | ICD-10-CM

## 2022-02-13 DIAGNOSIS — E559 Vitamin D deficiency, unspecified: Secondary | ICD-10-CM | POA: Diagnosis present

## 2022-02-13 DIAGNOSIS — Z1152 Encounter for screening for COVID-19: Secondary | ICD-10-CM

## 2022-02-13 DIAGNOSIS — E119 Type 2 diabetes mellitus without complications: Secondary | ICD-10-CM | POA: Diagnosis present

## 2022-02-13 DIAGNOSIS — U071 COVID-19: Secondary | ICD-10-CM | POA: Diagnosis present

## 2022-02-13 DIAGNOSIS — L308 Other specified dermatitis: Secondary | ICD-10-CM | POA: Diagnosis present

## 2022-02-13 DIAGNOSIS — G9341 Metabolic encephalopathy: Secondary | ICD-10-CM | POA: Diagnosis not present

## 2022-02-13 DIAGNOSIS — K219 Gastro-esophageal reflux disease without esophagitis: Secondary | ICD-10-CM | POA: Diagnosis present

## 2022-02-13 DIAGNOSIS — F418 Other specified anxiety disorders: Secondary | ICD-10-CM | POA: Diagnosis present

## 2022-02-13 DIAGNOSIS — Z79899 Other long term (current) drug therapy: Secondary | ICD-10-CM

## 2022-02-13 DIAGNOSIS — N3000 Acute cystitis without hematuria: Secondary | ICD-10-CM | POA: Diagnosis present

## 2022-02-13 DIAGNOSIS — Z79811 Long term (current) use of aromatase inhibitors: Secondary | ICD-10-CM

## 2022-02-13 DIAGNOSIS — Z853 Personal history of malignant neoplasm of breast: Secondary | ICD-10-CM

## 2022-02-13 DIAGNOSIS — M810 Age-related osteoporosis without current pathological fracture: Secondary | ICD-10-CM | POA: Diagnosis present

## 2022-02-13 DIAGNOSIS — F71 Moderate intellectual disabilities: Secondary | ICD-10-CM | POA: Diagnosis present

## 2022-02-13 DIAGNOSIS — F0390 Unspecified dementia without behavioral disturbance: Secondary | ICD-10-CM | POA: Diagnosis present

## 2022-02-13 DIAGNOSIS — E785 Hyperlipidemia, unspecified: Secondary | ICD-10-CM | POA: Diagnosis present

## 2022-02-13 DIAGNOSIS — D62 Acute posthemorrhagic anemia: Secondary | ICD-10-CM | POA: Diagnosis not present

## 2022-02-13 DIAGNOSIS — Z8619 Personal history of other infectious and parasitic diseases: Secondary | ICD-10-CM

## 2022-02-13 DIAGNOSIS — S72409A Unspecified fracture of lower end of unspecified femur, initial encounter for closed fracture: Secondary | ICD-10-CM | POA: Diagnosis present

## 2022-02-13 DIAGNOSIS — W19XXXA Unspecified fall, initial encounter: Secondary | ICD-10-CM | POA: Diagnosis present

## 2022-02-13 DIAGNOSIS — Z7984 Long term (current) use of oral hypoglycemic drugs: Secondary | ICD-10-CM

## 2022-02-13 DIAGNOSIS — F331 Major depressive disorder, recurrent, moderate: Secondary | ICD-10-CM | POA: Diagnosis present

## 2022-02-13 DIAGNOSIS — Z87440 Personal history of urinary (tract) infections: Secondary | ICD-10-CM

## 2022-02-13 DIAGNOSIS — B9620 Unspecified Escherichia coli [E. coli] as the cause of diseases classified elsewhere: Secondary | ICD-10-CM | POA: Diagnosis present

## 2022-02-13 DIAGNOSIS — R059 Cough, unspecified: Secondary | ICD-10-CM | POA: Diagnosis present

## 2022-02-13 LAB — URINALYSIS, ROUTINE W REFLEX MICROSCOPIC
Bilirubin Urine: NEGATIVE
Glucose, UA: NEGATIVE mg/dL
Hgb urine dipstick: NEGATIVE
Ketones, ur: NEGATIVE mg/dL
Nitrite: POSITIVE — AB
Protein, ur: NEGATIVE mg/dL
Specific Gravity, Urine: 1.01 (ref 1.005–1.030)
Squamous Epithelial / HPF: NONE SEEN (ref 0–5)
WBC, UA: >50 WBC/hpf — ABNORMAL HIGH (ref 0–5)
pH: 5 (ref 5.0–8.0)

## 2022-02-13 LAB — COMPREHENSIVE METABOLIC PANEL
ALT: 15 U/L (ref 0–44)
AST: 21 U/L (ref 15–41)
Albumin: 2.8 g/dL — ABNORMAL LOW (ref 3.5–5.0)
Alkaline Phosphatase: 61 U/L (ref 38–126)
Anion gap: 3 — ABNORMAL LOW (ref 5–15)
BUN: 31 mg/dL — ABNORMAL HIGH (ref 8–23)
CO2: 30 mmol/L (ref 22–32)
Calcium: 8.2 mg/dL — ABNORMAL LOW (ref 8.9–10.3)
Chloride: 104 mmol/L (ref 98–111)
Creatinine, Ser: 0.99 mg/dL (ref 0.44–1.00)
GFR, Estimated: >60 mL/min (ref >60)
Glucose, Bld: 116 mg/dL — ABNORMAL HIGH (ref 70–99)
Potassium: 4 mmol/L (ref 3.5–5.1)
Sodium: 137 mmol/L (ref 135–145)
Total Bilirubin: 0.4 mg/dL (ref 0.3–1.2)
Total Protein: 5.8 g/dL — ABNORMAL LOW (ref 6.5–8.1)

## 2022-02-13 LAB — RESP PANEL BY RT-PCR (FLU A&B, COVID) ARPGX2
Influenza A by PCR: NEGATIVE
Influenza B by PCR: NEGATIVE
SARS Coronavirus 2 by RT PCR: NEGATIVE

## 2022-02-13 LAB — CBC WITH DIFFERENTIAL/PLATELET
Abs Immature Granulocytes: 0.03 10*3/uL (ref 0.00–0.07)
Basophils Absolute: 0 10*3/uL (ref 0.0–0.1)
Basophils Relative: 0 %
Eosinophils Absolute: 0 10*3/uL (ref 0.0–0.5)
Eosinophils Relative: 0 %
HCT: 27.4 % — ABNORMAL LOW (ref 36.0–46.0)
Hemoglobin: 8.7 g/dL — ABNORMAL LOW (ref 12.0–15.0)
Immature Granulocytes: 1 %
Lymphocytes Relative: 13 %
Lymphs Abs: 0.9 10*3/uL (ref 0.7–4.0)
MCH: 30 pg (ref 26.0–34.0)
MCHC: 31.8 g/dL (ref 30.0–36.0)
MCV: 94.5 fL (ref 80.0–100.0)
Monocytes Absolute: 0.7 10*3/uL (ref 0.1–1.0)
Monocytes Relative: 10 %
Neutro Abs: 5 10*3/uL (ref 1.7–7.7)
Neutrophils Relative %: 76 %
Platelets: 155 10*3/uL (ref 150–400)
RBC: 2.9 MIL/uL — ABNORMAL LOW (ref 3.87–5.11)
RDW: 14.1 % (ref 11.5–15.5)
WBC: 6.6 10*3/uL (ref 4.0–10.5)
nRBC: 0 % (ref 0.0–0.2)

## 2022-02-13 MED ORDER — DOCUSATE SODIUM 100 MG PO CAPS: 100.0000 mg | ORAL_CAPSULE | Freq: Two times a day (BID) | ORAL | Status: DC | PRN

## 2022-02-13 MED ORDER — PANTOPRAZOLE SODIUM 40 MG PO TBEC
40.0000 mg | DELAYED_RELEASE_TABLET | Freq: Every morning | ORAL | Status: DC
  Administered 2022-02-14 – 2022-02-22 (×9): 40 mg via ORAL
  Filled 2022-02-13 (×9): qty 1

## 2022-02-13 MED ORDER — DIVALPROEX SODIUM 125 MG PO CSDR: 250.0000 mg | DELAYED_RELEASE_CAPSULE | ORAL | Status: DC

## 2022-02-13 MED ORDER — ONDANSETRON HCL 4 MG/2ML IJ SOLN: 4.0000 mg | Freq: Three times a day (TID) | INTRAMUSCULAR | Status: DC | PRN

## 2022-02-13 MED ORDER — BIOTENE DRY MOUTH DT PSTE: 1.0000 | PASTE | Freq: Three times a day (TID) | DENTAL | Status: DC

## 2022-02-13 MED ORDER — BIOTENE PBF DRY MOUTH MT LIQD: 15.0000 mL | Freq: Two times a day (BID) | OROMUCOSAL | Status: DC

## 2022-02-13 MED ORDER — DONEPEZIL HCL 5 MG PO TABS
5.0000 mg | ORAL_TABLET | Freq: Two times a day (BID) | ORAL | Status: DC
  Administered 2022-02-14 – 2022-02-22 (×18): 5 mg via ORAL
  Filled 2022-02-13 (×18): qty 1

## 2022-02-13 MED ORDER — POLYETHYLENE GLYCOL 3350 17 G PO PACK
17.0000 g | PACK | Freq: Every day | ORAL | Status: DC
  Administered 2022-02-13 – 2022-02-18 (×5): 17 g via ORAL
  Filled 2022-02-13 (×6): qty 1

## 2022-02-13 MED ORDER — DIVALPROEX SODIUM 125 MG PO CSDR
375.0000 mg | DELAYED_RELEASE_CAPSULE | Freq: Every day | ORAL | Status: DC
  Administered 2022-02-13 – 2022-02-21 (×9): 375 mg via ORAL
  Filled 2022-02-13 (×10): qty 3

## 2022-02-13 MED ORDER — CLONAZEPAM 1 MG PO TABS
1.0000 mg | ORAL_TABLET | Freq: Every day | ORAL | Status: DC
  Administered 2022-02-14 – 2022-02-22 (×9): 1 mg via ORAL
  Filled 2022-02-13 (×3): qty 1
  Filled 2022-02-13: qty 2
  Filled 2022-02-13 (×5): qty 1

## 2022-02-13 MED ORDER — SODIUM CHLORIDE 0.9 % IV BOLUS
1000.0000 mL | Freq: Once | INTRAVENOUS | Status: AC
  Administered 2022-02-13: 1000 mL via INTRAVENOUS

## 2022-02-13 MED ORDER — SODIUM CHLORIDE 0.9 % IV SOLN
1.0000 g | Freq: Once | INTRAVENOUS | Status: AC
  Administered 2022-02-13: 1 g via INTRAVENOUS
  Filled 2022-02-13: qty 10

## 2022-02-13 MED ORDER — MEMANTINE HCL 5 MG PO TABS
10.0000 mg | ORAL_TABLET | Freq: Two times a day (BID) | ORAL | Status: DC
  Administered 2022-02-14 – 2022-02-22 (×18): 10 mg via ORAL
  Filled 2022-02-13 (×18): qty 2

## 2022-02-13 MED ORDER — DIVALPROEX SODIUM 125 MG PO CSDR
250.0000 mg | DELAYED_RELEASE_CAPSULE | Freq: Every day | ORAL | Status: DC
  Administered 2022-02-14 – 2022-02-22 (×8): 250 mg via ORAL
  Filled 2022-02-13 (×9): qty 2

## 2022-02-13 MED ORDER — DULOXETINE HCL 30 MG PO CPEP
30.0000 mg | ORAL_CAPSULE | Freq: Every day | ORAL | Status: DC
  Administered 2022-02-13 – 2022-02-22 (×10): 30 mg via ORAL
  Filled 2022-02-13 (×10): qty 1

## 2022-02-13 MED ORDER — DM-GUAIFENESIN ER 30-600 MG PO TB12
1.0000 | ORAL_TABLET | Freq: Two times a day (BID) | ORAL | Status: DC | PRN
  Filled 2022-02-13: qty 1

## 2022-02-13 MED ORDER — LETROZOLE 2.5 MG PO TABS
2.5000 mg | ORAL_TABLET | Freq: Every day | ORAL | Status: DC
  Administered 2022-02-13 – 2022-02-22 (×9): 2.5 mg via ORAL
  Filled 2022-02-13 (×11): qty 1

## 2022-02-13 MED ORDER — CLONAZEPAM 0.5 MG PO TABS
0.5000 mg | ORAL_TABLET | Freq: Every day | ORAL | Status: DC
  Administered 2022-02-13 – 2022-02-21 (×9): 0.5 mg via ORAL
  Filled 2022-02-13 (×9): qty 1

## 2022-02-13 MED ORDER — ALBUTEROL SULFATE (2.5 MG/3ML) 0.083% IN NEBU: 3.0000 mL | INHALATION_SOLUTION | RESPIRATORY_TRACT | Status: DC | PRN

## 2022-02-13 MED ORDER — DULOXETINE HCL 30 MG PO CPEP
60.0000 mg | ORAL_CAPSULE | Freq: Every day | ORAL | Status: DC
  Administered 2022-02-14 – 2022-02-22 (×10): 60 mg via ORAL
  Filled 2022-02-13 (×5): qty 2
  Filled 2022-02-13: qty 1
  Filled 2022-02-13 (×3): qty 2
  Filled 2022-02-13: qty 1

## 2022-02-13 MED ORDER — MIRTAZAPINE 15 MG PO TABS
7.5000 mg | ORAL_TABLET | Freq: Every day | ORAL | Status: DC
  Administered 2022-02-14 – 2022-02-21 (×9): 7.5 mg via ORAL
  Filled 2022-02-13 (×9): qty 1

## 2022-02-13 MED ORDER — VITAMIN D 25 MCG (1000 UNIT) PO TABS
1000.0000 [IU] | ORAL_TABLET | Freq: Every day | ORAL | Status: DC
  Administered 2022-02-14 – 2022-02-22 (×9): 1000 [IU] via ORAL
  Filled 2022-02-13 (×9): qty 1

## 2022-02-13 MED ORDER — ENOXAPARIN SODIUM 40 MG/0.4ML IJ SOSY
40.0000 mg | PREFILLED_SYRINGE | INTRAMUSCULAR | Status: DC
  Administered 2022-02-14: 40 mg via SUBCUTANEOUS
  Filled 2022-02-13: qty 0.4

## 2022-02-13 MED ORDER — HYDRALAZINE HCL 20 MG/ML IJ SOLN: 5.0000 mg | INTRAMUSCULAR | Status: DC | PRN

## 2022-02-13 MED ORDER — NYSTATIN 100000 UNIT/GM EX POWD
Freq: Two times a day (BID) | CUTANEOUS | Status: DC
  Filled 2022-02-13 (×4): qty 15

## 2022-02-13 MED ORDER — CLONAZEPAM 0.5 MG PO TABS: 0.5000 mg | ORAL_TABLET | ORAL | Status: DC

## 2022-02-13 MED ORDER — ACETAMINOPHEN 325 MG PO TABS
650.0000 mg | ORAL_TABLET | Freq: Four times a day (QID) | ORAL | Status: DC | PRN
  Administered 2022-02-13 – 2022-02-14 (×4): 650 mg via ORAL
  Filled 2022-02-13 (×4): qty 2

## 2022-02-13 MED ORDER — BREXPIPRAZOLE 1 MG PO TABS
1.0000 mg | ORAL_TABLET | Freq: Every morning | ORAL | Status: DC
  Administered 2022-02-14 – 2022-02-22 (×9): 1 mg via ORAL
  Filled 2022-02-13 (×9): qty 1

## 2022-02-13 MED ORDER — QUETIAPINE FUMARATE 300 MG PO TABS
300.0000 mg | ORAL_TABLET | Freq: Every day | ORAL | Status: DC
  Administered 2022-02-14 – 2022-02-21 (×8): 300 mg via ORAL
  Filled 2022-02-13 (×11): qty 1

## 2022-02-13 MED ORDER — SODIUM CHLORIDE 0.9 % IV SOLN
1.0000 g | INTRAVENOUS | Status: DC
  Administered 2022-02-14: 1 g via INTRAVENOUS
  Filled 2022-02-13: qty 1
  Filled 2022-02-13 (×3): qty 10

## 2022-02-13 MED ORDER — FOLIC ACID 1 MG PO TABS
0.5000 mg | ORAL_TABLET | Freq: Every day | ORAL | Status: DC
  Administered 2022-02-13 – 2022-02-22 (×10): 0.5 mg via ORAL
  Filled 2022-02-13 (×10): qty 1

## 2022-02-13 MED ORDER — QUETIAPINE FUMARATE 25 MG PO TABS
50.0000 mg | ORAL_TABLET | Freq: Two times a day (BID) | ORAL | Status: DC
  Administered 2022-02-13 – 2022-02-22 (×18): 50 mg via ORAL
  Filled 2022-02-13 (×19): qty 2

## 2022-02-13 MED ORDER — FERROUS GLUCONATE 324 (38 FE) MG PO TABS
324.0000 mg | ORAL_TABLET | Freq: Every day | ORAL | Status: DC
  Administered 2022-02-14 – 2022-02-18 (×5): 324 mg via ORAL
  Filled 2022-02-13 (×5): qty 1

## 2022-02-13 MED ORDER — DIVALPROEX SODIUM 125 MG PO CSDR
250.0000 mg | DELAYED_RELEASE_CAPSULE | Freq: Every day | ORAL | Status: DC
  Administered 2022-02-14 – 2022-02-22 (×9): 250 mg via ORAL
  Filled 2022-02-13 (×9): qty 2

## 2022-02-13 NOTE — Assessment & Plan Note (Signed)
Hemoglobin 8.7 (9.5 on 11/06/2019) - Continue iron supplement

## 2022-02-13 NOTE — Assessment & Plan Note (Signed)
Continue donepezil, Namenda

## 2022-02-13 NOTE — ED Notes (Signed)
Pt no verbal but holds left knee and grimmaces

## 2022-02-13 NOTE — ED Notes (Signed)
Spoke with Licensed conveyancer with [facility] '@[phone]'$ [phone] with care plan update

## 2022-02-13 NOTE — Assessment & Plan Note (Signed)
Per report, patient is more confused than baseline.  Patient has history of cognitive impairment. Not sure about baseline mental status.  CT head negative.  Likely due to UTI Able to communicate somewhat today.

## 2022-02-13 NOTE — Assessment & Plan Note (Addendum)
-   Continue home medications 

## 2022-02-13 NOTE — Assessment & Plan Note (Signed)
Patient has mild dry cough, no fever.  Oxygen saturation 98% on room air.  Chest x-ray negative.  Patient was started on molnupiravir on 10/3 in facility.  Today COVID test is negative. -Will not continue molnupiravir -As needed albuterol and Mucinex

## 2022-02-13 NOTE — ED Notes (Signed)
Spoke with Carly Collins at pts group home who reports pt has been more weak since testing positive for COVID on Monday, pt tested negative yesterday. At baseline pt walks around and talks with people but is not oriented to time. MD notified.

## 2022-02-13 NOTE — ED Notes (Signed)
Pt straight cath'd for urine specimen. Large amounts of redness and foul smelling odor for peri area and skin folds. Pt placed back on purwic.

## 2022-02-13 NOTE — ED Triage Notes (Signed)
Pt oriented to self only

## 2022-02-13 NOTE — Assessment & Plan Note (Addendum)
Bp 110/88. Pt is not taking meds now - IV hydralazine as needed

## 2022-02-13 NOTE — ED Notes (Signed)
Pt eating dinner at this time

## 2022-02-13 NOTE — ED Provider Notes (Signed)
[facility] Provider Note    Event Date/Time   First MD Initiated Contact with Patient 02/13/22 1101     (approximate)   History   Altered Mental Status (fall) and Fall   HPI  Carly Collins is a 66 y.o. 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  Patient was diagnosed with COVID this past Monday.  Since that time she has had worsening altered mental status and is now only oriented to herself which the facility states is not her baseline.  They also endorse a fall at the group home and another fall with EMS.  Unclear if she hit her head or had loss of consciousness.  No new medication changes.  Patient complaining of lower abdominal pain and burning with urination.     Physical Exam   Triage Vital Signs: ED Triage Vitals  Enc Vitals Group     BP 02/13/22 1103 (!) 110/52     Pulse Rate 02/13/22 1103 (!) 58     Resp 02/13/22 1103 18     Temp 02/13/22 1103 97.8 F (36.6 C)     Temp Source 02/13/22 1103 Oral     SpO2 02/13/22 1103 99 %     Weight 02/13/22 1104 134 lb 7.7 oz (61 kg)     Height --      Head Circumference --      Peak Flow --      Pain Score 02/13/22 1104 0     Pain Loc --      Pain Edu? --      Excl. in [place]? --     Most recent vital signs: Vitals:   02/13/22 1103 02/13/22 1430  BP: (!) 110/52 110/88  Pulse: (!) 58 61  Resp: 18 19  Temp: 97.8 F (36.6 C)   SpO2: 99% 98%    Physical Exam  General: Awake, laying in bed, appears to not feel well CV:  Good peripheral perfusion.  Resp:  Coarse breath sounds throughout all lung fields Abd:  No distention.  Mild suprapubic abdominal tenderness to palpation with no rebound or guarding.  No CVA tenderness. Other:  No obvious head trauma.  Pupils are equal and reactive.  No midline cervical, thoracic or lumbar tenderness to palpation.  No tenderness to  palpation to bilateral upper or lower extremities.  Moving all extremities without difficulties.  No open wounds.  No hematoma.   ED Results / Procedures / Treatments   Labs (all labs ordered are listed, but only abnormal results are displayed) Labs Reviewed  COMPREHENSIVE METABOLIC PANEL - Abnormal; Notable for the following components:      Result Value   Glucose, Bld 116 (*)    BUN 31 (*)    Calcium 8.2 (*)    Total Protein 5.8 (*)    Albumin 2.8 (*)    Anion gap 3 (*)    All other components within normal limits  CBC WITH DIFFERENTIAL/PLATELET - Abnormal; Notable for the following components:   RBC 2.90 (*)    Hemoglobin 8.7 (*)    HCT 27.4 (*)    All other components within normal limits  URINALYSIS, ROUTINE W REFLEX MICROSCOPIC - Abnormal; Notable for the following components:   Color, Urine YELLOW (*)    APPearance HAZY (*)    Nitrite POSITIVE (*)    Leukocytes,Ua LARGE (*)    WBC, UA >  50 (*)    Bacteria, UA RARE (*)    All other components within normal limits  RESP PANEL BY RT-PCR (FLU A&B, COVID) ARPGX2  URINE CULTURE  HIV ANTIBODY (ROUTINE TESTING W REFLEX)     EKG  My interpretation of the EKG -sinus bradycardia with a rate of 57 normal intervals.  No chamber enlargement.  No significant ST elevation or depression.  No signs of acute ischemia or dysrhythmia.  No tachycardic or bradycardic dysrhythmias while on cardiac telemetry.  Sinus bradycardia with rates in the 60s.   RADIOLOGY  Chest x-ray, CT scan of her head independently reviewed showed no signs of intracranial hemorrhage or infarction.  Chest x-ray without focal findings consistent with pneumonia.   Imaging was read as no acute findings.   PROCEDURES:  Critical Care performed: No  Procedures   MEDICATIONS ORDERED IN ED: Medications  cefTRIAXone (ROCEPHIN) 1 g in sodium chloride 0.9 % 100 mL IVPB (has no administration in time range)  ondansetron (ZOFRAN) injection 4 mg (has no  administration in time range)  acetaminophen (TYLENOL) tablet 650 mg (has no administration in time range)  hydrALAZINE (APRESOLINE) injection 5 mg (has no administration in time range)  enoxaparin (LOVENOX) injection 40 mg (has no administration in time range)  albuterol (PROVENTIL) (2.5 MG/3ML) 0.083% nebulizer solution 3 mL (has no administration in time range)  dextromethorphan-guaiFENesin (MUCINEX DM) 30-600 MG per 12 hr tablet 1 tablet (has no administration in time range)  sodium chloride 0.9 % bolus 1,000 mL (1,000 mLs Intravenous New Bag/Given 02/13/22 1204)     IMPRESSION / MDM / ASSESSMENT AND PLAN / ED COURSE  I reviewed the triage vital signs and the nursing notes.   Differential diagnosis includes, but is not limited to, dehydration, metabolic derangement, intracranial hemorrhage, CVA, pneumonia.  Low suspicion for meningitis, no fever no meningismus on exam.   Patient without significant leukocytosis.  Patient has anemia but her hemoglobin appears to be close to her baseline at 8.7.  No significant electrolyte abnormalities.  Creatinine is at her baseline.  UA consistent with urinary tract infection.  On chart review patient has had urine cultures in the past that have been pansensitive to antibiotics.  Most likely with altered mental status secondary to a urinary tract infection.  Patient ordered IV Rocephin to cover for UTI.    Consulted the hospitalist and is discussed with Dr. Blaine Hamper, recommended admission for altered mental status and urinary tract infection  Patient's presentation is most consistent with acute presentation with potential threat to life or bodily function.   FINAL CLINICAL IMPRESSION(S) / ED DIAGNOSES   Final diagnoses:  Altered mental status, unspecified altered mental status type  Acute cystitis without hematuria     Rx / DC Orders   ED Discharge Orders     None        Note:  This document was prepared using Dragon voice recognition  software and may include unintentional dictation errors.   Nathaniel Man, MD 02/13/22 1550

## 2022-02-13 NOTE — Assessment & Plan Note (Signed)
-  continue letrozole

## 2022-02-13 NOTE — H&P (Signed)
History and Physical    Carly Collins JJH:[id number] DOB: 1955-06-17 DOA: 02/13/2022  Referring MD/NP/PA:   PCP: Janace Litten., MD   Patient coming from:  The patient is coming from group home.   Chief Complaint: fall and confusion  HPI: Carly Collins is a 66 y.o. female with medical history significant of cognitive impairment, hypertension, hyperlipidemia, diet-controlled diabetes, GERD, depression with anxiety, recurrent UTI, C. difficile colitis, iron deficiency anemia, breast cancer, who presents with fall and confusion.  Patient has history of cognitive impairment, cannot provide accurate medical history.  I have tried to call her brother without success.  Per report, patient had positive COVID test 1 week ago. She has mild dry cough, does not seem to have shortness of breath and chest pain.  Today her COVID test is negative.  Patient was found to be more confused than her baseline in facility.  She fell twice in facility. Unclear if she hit her head or had loss of consciousness. When I saw patient in ED, patient knows her own name, but not orientated to the time and place.  Not sure about her baseline mental status.  She moves all extremities normally.  No facial droop or slurred speech.  Patient has dysuria and suprapubic abdominal pain..  No active nausea vomiting, diarrhea or abdominal pain. pt has intertrigo dermatitis in the abdominal skin folds  Data reviewed independently and ED Course: pt was found to have positive urinalysis (hazy appearance, large amount of leukocyte, positive nitrite, rare bacteria, WBC> 50), negative COVID PCR, WBC 6.6, GFR> 60, temperature normal, blood pressure 110/88, heart rate 58, RR 19, oxygen saturation 98% on room air.  Chest x-ray negative.  CT head is negative.  Patient is placed on MedSurg bed for observation   EKG: I have personally reviewed.  Sinus rhythm, QTc 439, bradycardia, heart rate 57, no ischemic change   Review of Systems: Could  not reviewed accurately due to altered mental status and history of cognitive impairment.  Allergy:  Allergies  Allergen Reactions   Pioglitazone Other (See Comments)    unknown Unknown to patient     Past Medical History:  Diagnosis Date   Anxiety    C. difficile enteritis    Cancer ([place])    Depression    Diabetes mellitus without complication (HCC)    GERD (gastroesophageal reflux disease)    Headache    Hyperlipidemia    Hypertension    Mental retardation, mild (I.Q. 50-70)    Moderate intellectual disabilities    Obesity    Recurrent UTI    Tibial fracture    Vitamin D deficiency     Past Surgical History:  Procedure Laterality Date   ABDOMINAL SURGERY     APPENDECTOMY     BREAST SURGERY     COLON SURGERY     COLONOSCOPY N/A 10/26/2014   Procedure: COLONOSCOPY;  Surgeon: Lollie Sails, MD;  Location: [facility] ENDOSCOPY;  Service: Endoscopy;  Laterality: N/A;   COLONOSCOPY N/A 01/21/2020   Procedure: COLONOSCOPY;  Surgeon: Robert Bellow, MD;  Location: [facility] ENDOSCOPY;  Service: Endoscopy;  Laterality: N/A;   DILATION AND CURETTAGE OF UTERUS     ESOPHAGOGASTRODUODENOSCOPY N/A 01/21/2020   Procedure: ESOPHAGOGASTRODUODENOSCOPY (EGD);  Surgeon: Robert Bellow, MD;  Location: [facility] ENDOSCOPY;  Service: Endoscopy;  Laterality: N/A;   ESOPHAGOGASTRODUODENOSCOPY (EGD) WITH PROPOFOL N/A 10/26/2014   Procedure: ESOPHAGOGASTRODUODENOSCOPY (EGD) WITH PROPOFOL;  Surgeon: Lollie Sails, MD;  Location: [facility] ENDOSCOPY;  Service: Endoscopy;  Laterality: N/A;   FRACTURE SURGERY      Social History:  reports that she has never smoked. She has never used smokeless tobacco. She reports that she does not drink alcohol and does not use drugs.  Family History:  Family History  Problem Relation Age of Onset   Other Mother    Other Father      Prior to Admission medications   Medication Sig Start Date End Date Taking? Authorizing Provider  acetaminophen (MAPAP) 500 MG  tablet GIVE 2 TABLETS (1000 MG) BY MOUTH 3 TIMES DAILY FOR DISCOMFORT/PAIN 07/01/16   [provider]  alendronate (FOSAMAX) 70 MG tablet Take 70 mg by mouth once a week. 10/08/19   [provider]  Cholecalciferol (VITAMIN D3) 2000 UNITS TABS Take 2,000 Units by mouth once.    [provider]  clonazePAM (KLONOPIN) 0.5 MG tablet Take 0.5 mg by mouth as needed (agitation).     [provider]  clonazePAM (KLONOPIN) 1 MG tablet Take 1 mg by mouth 2 (two) times daily as needed. 10/10/19   [provider]  divalproex (DEPAKOTE SPRINKLE) 125 MG capsule Take 375 mg by mouth 3 (three) times daily. 3 capsules in the morning, 2 capsules at noon, and 3 capsules at bedtime    [provider]  docusate sodium (COLACE) 100 MG capsule Take 100 mg by mouth 2 (two) times daily.    [provider]  DULoxetine (CYMBALTA) 30 MG capsule Take 30 mg by mouth daily. Take in addition to 60 mg for a total of 90 mg daily    [provider]  DULoxetine (CYMBALTA) 60 MG capsule Take 60 mg by mouth daily.     [provider]  ferrous gluconate (FERGON) 240 (27 FE) MG tablet Take 240 mg by mouth 3 (three) times daily with meals.    [provider]  folic acid (FOLVITE) 710 MCG tablet Take 400 mcg by mouth 2 (two) times daily.     [provider]  hydrOXYzine (ATARAX/VISTARIL) 25 MG tablet Take 25 mg by mouth 2 (two) times daily.    [provider]  letrozole (FEMARA) 2.5 MG tablet Take 2.5 mg by mouth daily.    [provider]  pantoprazole (PROTONIX) 40 MG tablet Take 40 mg by mouth daily.     [provider]  PARoxetine (PAXIL) 20 MG tablet Take 20 mg by mouth daily.    [provider]  polyethylene glycol (MIRALAX / GLYCOLAX) packet Take by mouth.    [provider]  QUEtiapine (SEROQUEL) 100 MG tablet Take 100 mg by mouth 2 (two) times daily.     [provider]  QUEtiapine  (SEROQUEL) 300 MG tablet Take 300 mg by mouth at bedtime.    [provider]  sitaGLIPtin (JANUVIA) 100 MG tablet Take 100 mg by mouth daily.  01/17/19  [provider]    Physical Exam: Vitals:   02/13/22 1104 02/13/22 1430 02/13/22 1619 02/13/22 1630  BP:  110/88    Pulse:  61  75  Resp:  19  17  Temp:      TempSrc:      SpO2:  98%  94%  Weight: 61 kg     Height:   '5\' 2"'$  (1.575 m)    General: Not in acute distress HEENT:       Eyes: PERRL, EOMI, no scleral icterus.       ENT: No discharge from the ears and nose, no  pharynx injection, no tonsillar enlargement.        Neck: No JVD, no bruit, no mass felt. Heme: No neck lymph node enlargement. Cardiac: S1/S2, RRR, No murmurs, No gallops or rubs. Respiratory: No rales, wheezing, rhonchi or rubs. GI: Soft, nondistended, nontender, no rebound pain, no organomegaly, BS present. GU: No hematuria Ext: No pitting leg edema bilaterally. 1+DP/PT pulse bilaterally. Musculoskeletal: No joint deformities, No joint redness or warmth, no limitation of ROM in spin. Skin: pt has intertrigo dermatitis in the abdominal skin folds Neuro: Alert, knows her own name, not orientated to place and time, cranial nerves II-XII grossly intact, moves all extremities normally.  Psych: Patient is not psychotic, no suicidal or hemocidal ideation.  Labs on Admission: I have personally reviewed following labs and imaging studies  CBC: Recent Labs  Lab 02/13/22 1124  WBC 6.6  NEUTROABS 5.0  HGB 8.7*  HCT 27.4*  MCV 94.5  PLT 161   Basic Metabolic Panel: Recent Labs  Lab 02/13/22 1124  NA 137  K 4.0  CL 104  CO2 30  GLUCOSE 116*  BUN 31*  CREATININE 0.99  CALCIUM 8.2*   GFR: Estimated Creatinine Clearance: 48.7 mL/min (by C-G formula based on SCr of 0.99 mg/dL). Liver Function Tests: Recent Labs  Lab 02/13/22 1124  AST 21  ALT 15  ALKPHOS 61  BILITOT 0.4  PROT 5.8*  ALBUMIN 2.8*   No results for input(s): "LIPASE",  "AMYLASE" in the last 168 hours. No results for input(s): "AMMONIA" in the last 168 hours. Coagulation Profile: No results for input(s): "INR", "PROTIME" in the last 168 hours. Cardiac Enzymes: No results for input(s): "CKTOTAL", "CKMB", "CKMBINDEX", "TROPONINI" in the last 168 hours. BNP (last 3 results) No results for input(s): "PROBNP" in the last 8760 hours. HbA1C: No results for input(s): "HGBA1C" in the last 72 hours. CBG: No results for input(s): "GLUCAP" in the last 168 hours. Lipid Profile: No results for input(s): "CHOL", "HDL", "LDLCALC", "TRIG", "CHOLHDL", "LDLDIRECT" in the last 72 hours. Thyroid Function Tests: No results for input(s): "TSH", "T4TOTAL", "FREET4", "T3FREE", "THYROIDAB" in the last 72 hours. Anemia Panel: No results for input(s): "VITAMINB12", "FOLATE", "FERRITIN", "TIBC", "IRON", "RETICCTPCT" in the last 72 hours. Urine analysis:    Component Value Date/Time   COLORURINE YELLOW (A) 02/13/2022 1422   APPEARANCEUR HAZY (A) 02/13/2022 1422   LABSPEC 1.010 02/13/2022 1422   PHURINE 5.0 02/13/2022 1422   GLUCOSEU NEGATIVE 02/13/2022 1422   HGBUR NEGATIVE 02/13/2022 1422   BILIRUBINUR NEGATIVE 02/13/2022 1422   KETONESUR NEGATIVE 02/13/2022 1422   PROTEINUR NEGATIVE 02/13/2022 1422   NITRITE POSITIVE (A) 02/13/2022 1422   LEUKOCYTESUR LARGE (A) 02/13/2022 1422   Sepsis Labs: '@LABRCNTIP'$ (procalcitonin:4,lacticidven:4) ) Recent Results (from the past 240 hour(s))  Resp Panel by RT-PCR (Flu A&B, Covid) Anterior Nasal Swab     Status: None   Collection Time: 02/13/22 11:25 AM   Specimen: Anterior Nasal Swab  Result Value Ref Range Status   SARS Coronavirus 2 by RT PCR NEGATIVE NEGATIVE Final    Comment: (NOTE) SARS-CoV-2 target nucleic acids are NOT DETECTED.  The SARS-CoV-2 RNA is generally detectable in upper respiratory specimens during the acute phase of infection. The lowest concentration of SARS-CoV-2 viral copies this assay can detect is 138  copies/mL. A negative result does not preclude SARS-Cov-2 infection and should not be used as the sole basis for treatment or other patient management decisions. A negative result may occur with  improper specimen collection/handling, submission of specimen other than  nasopharyngeal swab, presence of viral mutation(s) within the areas targeted by this assay, and inadequate number of viral copies(<138 copies/mL). A negative result must be combined with clinical observations, patient history, and epidemiological information. The expected result is Negative.  Fact Sheet for Patients:  EntrepreneurPulse.com.au  Fact Sheet for Healthcare Providers:  IncredibleEmployment.be  This test is no t yet approved or cleared by the [place] FDA and  has been authorized for detection and/or diagnosis of SARS-CoV-2 by FDA under an Emergency Use Authorization (EUA). This EUA will remain  in effect (meaning this test can be used) for the duration of the COVID-19 declaration under Section 564(b)(1) of the Act, 21 U.S.C.section 360bbb-3(b)(1), unless the authorization is terminated  or revoked sooner.       Influenza A by PCR NEGATIVE NEGATIVE Final   Influenza B by PCR NEGATIVE NEGATIVE Final    Comment: (NOTE) The Xpert Xpress SARS-CoV-2/FLU/RSV plus assay is intended as an aid in the diagnosis of influenza from Nasopharyngeal swab specimens and should not be used as a sole basis for treatment. Nasal washings and aspirates are unacceptable for Xpert Xpress SARS-CoV-2/FLU/RSV testing.  Fact Sheet for Patients: EntrepreneurPulse.com.au  Fact Sheet for Healthcare Providers: IncredibleEmployment.be  This test is not yet approved or cleared by the [place] FDA and has been authorized for detection and/or diagnosis of SARS-CoV-2 by FDA under an Emergency Use Authorization (EUA). This EUA will remain in effect (meaning  this test can be used) for the duration of the COVID-19 declaration under Section 564(b)(1) of the Act, 21 U.S.C. section 360bbb-3(b)(1), unless the authorization is terminated or revoked.  Performed at [facility], [address] [place], [place] [address]      Radiological Exams on Admission: DG Chest 2 View  Result Date: 02/13/2022 CLINICAL DATA:  Cough, COVID positive EXAM: CHEST - 2 VIEW COMPARISON:  01/26/2021 FINDINGS: Cardiac size is within normal limits. Lung fields are clear of any infiltrates or pulmonary edema. There is no pleural effusion or pneumothorax. Dextroscoliosis is seen in thoracic spine. Calcification is noted in left paratracheal region in the lower neck, possibly in thyroid. Surgical clips are seen in right chest wall. IMPRESSION: No active cardiopulmonary disease. Electronically Signed   By: Elmer Picker M.D.   On: 02/13/2022 11:54   CT Head Wo Contrast  Result Date: 02/13/2022 CLINICAL DATA:  Altered mental status. EXAM: CT HEAD WITHOUT CONTRAST TECHNIQUE: Contiguous axial images were obtained from the base of the skull through the vertex without intravenous contrast. RADIATION DOSE REDUCTION: This exam was performed according to the departmental dose-optimization program which includes automated exposure control, adjustment of the mA and/or kV according to patient size and/or use of iterative reconstruction technique. COMPARISON:  None Available. FINDINGS: Brain: No evidence of acute infarction, hemorrhage, hydrocephalus, extra-axial collection or mass lesion/mass effect. Vascular: No hyperdense vessel or unexpected calcification. Skull: Normal. Negative for fracture or focal lesion. Sinuses/Orbits: Probable bilateral ethmoid and maxillary sinusitis. Other: None. IMPRESSION: No acute intracranial abnormality seen. Electronically Signed   By: Marijo Conception M.D.   On: 02/13/2022 11:49      Assessment/Plan Principal Problem:   UTI (urinary tract  infection) Active Problems:   Acute metabolic encephalopathy   Hypertension   Breast cancer (HCC)   Anxiety and depression   IDA (iron deficiency anemia)   Intertriginous dermatitis associated with moisture   Cognitive deficits   COVID-19 virus infection   Assessment and Plan: * UTI (urinary tract infection) Patient has recurrent UTI.  No sepsis. -Placed on MedSurg bed for observation -IV Rocephin -Follow-up urine culture  Acute metabolic encephalopathy Per report, patient is more confused than baseline.  Patient has history of cognitive impairment. Not sure about baseline mental status.  CT head negative.  Likely due to UTI -Frequent neuro check  Hypertension Bp 110/88. Pt is not taking meds now - IV hydralazine as needed  Breast cancer ([place]) -continue letrozole  Anxiety and depression - Continue home medications:  IDA (iron deficiency anemia) Hemoglobin 8.7 (9.5 on 11/06/2019) - Continue iron supplement  Intertriginous dermatitis associated with moisture - Topical nystatin powder  Cognitive deficits Continue donepezil, Namenda  COVID-19 virus infection Patient has mild dry cough, no fever.  Oxygen saturation 98% on room air.  Chest x-ray negative.  Patient was started on molnupiravir on 10/3 in facility.  Today COVID test is negative. -Will not continue molnupiravir -As needed albuterol and Mucinex          DVT ppx:  SQ Lovenox  Code Status: Full code  Family Communication: I have tried to call her brother who did not pick up the phone, I left a message  Disposition Plan:  Anticipate discharge back to previous environment, group home  Consults called:  none  Admission status and Level of care: Med-Surg:   for obs   Dispo: The patient is from: Group home              Anticipated d/c is to: Group home              Anticipated d/c date is: 1 day              Patient currently is not medically stable to d/c.    Severity of Illness:  The  appropriate patient status for this patient is OBSERVATION. Observation status is judged to be reasonable and necessary in order to provide the required intensity of service to ensure the patient's safety. The patient's presenting symptoms, physical exam findings, and initial radiographic and laboratory data in the context of their medical condition is felt to place them at decreased risk for further clinical deterioration. Furthermore, it is anticipated that the patient will be medically stable for discharge from the hospital within 2 midnights of admission.        Date of Service 02/13/2022    Ivor Costa [facility]   If 7PM-7AM, please contact night-coverage [email or web address] 02/13/2022, 6:10 PM

## 2022-02-13 NOTE — Assessment & Plan Note (Signed)
Urine cultures growing E. coli-pending susceptibility Patient has recurrent UTI.  No sepsis. -Continue with Rocephin for now-we will de-escalate once susceptibilities available

## 2022-02-13 NOTE — Assessment & Plan Note (Signed)
-   Topical nystatin powder

## 2022-02-13 NOTE — ED Notes (Addendum)
Pt Iv dressing changed to clear tape, dry and clean. Pt restless in bed, expressive communication incomprehensible.

## 2022-02-13 NOTE — ED Triage Notes (Signed)
Coming from group home- [facility] #[facility]- for COVID+ Monday- negative today. Pt fell at some point over night and again with [facility]- group home concerned with falls and being more altered than normal- baseline mental status is unclear.   64/36 HR 40's 500cc NS given en route T 98.0 Cbg 158 Repeat bp 103/54

## 2022-02-14 ENCOUNTER — Observation Stay: Payer: Medicare Other

## 2022-02-14 DIAGNOSIS — E119 Type 2 diabetes mellitus without complications: Secondary | ICD-10-CM | POA: Diagnosis present

## 2022-02-14 DIAGNOSIS — S72402A Unspecified fracture of lower end of left femur, initial encounter for closed fracture: Secondary | ICD-10-CM

## 2022-02-14 DIAGNOSIS — Z853 Personal history of malignant neoplasm of breast: Secondary | ICD-10-CM | POA: Diagnosis not present

## 2022-02-14 DIAGNOSIS — Z79811 Long term (current) use of aromatase inhibitors: Secondary | ICD-10-CM | POA: Diagnosis not present

## 2022-02-14 DIAGNOSIS — Z79899 Other long term (current) drug therapy: Secondary | ICD-10-CM | POA: Diagnosis not present

## 2022-02-14 DIAGNOSIS — S72452A Displaced supracondylar fracture without intracondylar extension of lower end of left femur, initial encounter for closed fracture: Secondary | ICD-10-CM | POA: Insufficient documentation

## 2022-02-14 DIAGNOSIS — Z87440 Personal history of urinary (tract) infections: Secondary | ICD-10-CM | POA: Diagnosis not present

## 2022-02-14 DIAGNOSIS — F418 Other specified anxiety disorders: Secondary | ICD-10-CM | POA: Diagnosis present

## 2022-02-14 DIAGNOSIS — K219 Gastro-esophageal reflux disease without esophagitis: Secondary | ICD-10-CM | POA: Diagnosis present

## 2022-02-14 DIAGNOSIS — L304 Erythema intertrigo: Secondary | ICD-10-CM | POA: Diagnosis present

## 2022-02-14 DIAGNOSIS — S72409A Unspecified fracture of lower end of unspecified femur, initial encounter for closed fracture: Secondary | ICD-10-CM | POA: Diagnosis present

## 2022-02-14 DIAGNOSIS — D62 Acute posthemorrhagic anemia: Secondary | ICD-10-CM | POA: Diagnosis not present

## 2022-02-14 DIAGNOSIS — F71 Moderate intellectual disabilities: Secondary | ICD-10-CM | POA: Diagnosis present

## 2022-02-14 DIAGNOSIS — D509 Iron deficiency anemia, unspecified: Secondary | ICD-10-CM | POA: Diagnosis present

## 2022-02-14 DIAGNOSIS — G9341 Metabolic encephalopathy: Secondary | ICD-10-CM | POA: Diagnosis present

## 2022-02-14 DIAGNOSIS — N390 Urinary tract infection, site not specified: Secondary | ICD-10-CM | POA: Diagnosis present

## 2022-02-14 DIAGNOSIS — R059 Cough, unspecified: Secondary | ICD-10-CM | POA: Diagnosis present

## 2022-02-14 DIAGNOSIS — E785 Hyperlipidemia, unspecified: Secondary | ICD-10-CM | POA: Diagnosis present

## 2022-02-14 DIAGNOSIS — B9620 Unspecified Escherichia coli [E. coli] as the cause of diseases classified elsewhere: Secondary | ICD-10-CM | POA: Diagnosis present

## 2022-02-14 DIAGNOSIS — Y92199 Unspecified place in other specified residential institution as the place of occurrence of the external cause: Secondary | ICD-10-CM | POA: Diagnosis not present

## 2022-02-14 DIAGNOSIS — N3000 Acute cystitis without hematuria: Secondary | ICD-10-CM | POA: Diagnosis present

## 2022-02-14 DIAGNOSIS — M810 Age-related osteoporosis without current pathological fracture: Secondary | ICD-10-CM | POA: Diagnosis present

## 2022-02-14 DIAGNOSIS — L308 Other specified dermatitis: Secondary | ICD-10-CM | POA: Diagnosis present

## 2022-02-14 DIAGNOSIS — Z8619 Personal history of other infectious and parasitic diseases: Secondary | ICD-10-CM | POA: Diagnosis not present

## 2022-02-14 DIAGNOSIS — I10 Essential (primary) hypertension: Secondary | ICD-10-CM | POA: Diagnosis present

## 2022-02-14 DIAGNOSIS — E559 Vitamin D deficiency, unspecified: Secondary | ICD-10-CM | POA: Diagnosis present

## 2022-02-14 DIAGNOSIS — Z8616 Personal history of COVID-19: Secondary | ICD-10-CM | POA: Diagnosis not present

## 2022-02-14 DIAGNOSIS — W19XXXA Unspecified fall, initial encounter: Secondary | ICD-10-CM | POA: Diagnosis present

## 2022-02-14 DIAGNOSIS — Z1152 Encounter for screening for COVID-19: Secondary | ICD-10-CM | POA: Diagnosis not present

## 2022-02-14 LAB — CBC
HCT: 24.5 % — ABNORMAL LOW (ref 36.0–46.0)
Hemoglobin: 7.9 g/dL — ABNORMAL LOW (ref 12.0–15.0)
MCH: 30 pg (ref 26.0–34.0)
MCHC: 32.2 g/dL (ref 30.0–36.0)
MCV: 93.2 fL (ref 80.0–100.0)
Platelets: 133 10*3/uL — ABNORMAL LOW (ref 150–400)
RBC: 2.63 MIL/uL — ABNORMAL LOW (ref 3.87–5.11)
RDW: 13.9 % (ref 11.5–15.5)
WBC: 4.7 10*3/uL (ref 4.0–10.5)
nRBC: 0 % (ref 0.0–0.2)

## 2022-02-14 LAB — HIV ANTIBODY (ROUTINE TESTING W REFLEX): HIV Screen 4th Generation wRfx: NONREACTIVE

## 2022-02-14 LAB — CBG MONITORING, ED: Glucose-Capillary: 190 mg/dL — ABNORMAL HIGH (ref 70–99)

## 2022-02-14 MED ORDER — CEFAZOLIN SODIUM-DEXTROSE 2-4 GM/100ML-% IV SOLN
2.0000 g | INTRAVENOUS | Status: AC
  Administered 2022-02-15: 2 g via INTRAVENOUS

## 2022-02-14 MED ORDER — HYDROCODONE-ACETAMINOPHEN 7.5-325 MG PO TABS
1.0000 | ORAL_TABLET | Freq: Four times a day (QID) | ORAL | Status: DC | PRN
  Administered 2022-02-14 – 2022-02-22 (×20): 1 via ORAL
  Filled 2022-02-14 (×21): qty 1

## 2022-02-14 NOTE — ED Notes (Addendum)
Nurse Butch Penny at [facility]. (To call Report) [phone]   Vassie Loll. Director of [facility]. [phone]

## 2022-02-14 NOTE — TOC Initial Note (Signed)
Transition of Care [facility]) - Initial/Assessment Note    Patient Details  Name: Carly Collins MRN: [id number] Date of Birth: [birth year]  Transition of Care [facility]) CM/SW Contact:    Beverly Sessions, RN Phone Number: 02/14/2022, 2:58 PM  Clinical Narrative:                    Admitted for: UTI Admitted from: [facility]  PCP: Lubertha Basque  Current home health/prior home health/DME: Vilinda Blanks (Brother) listed as guardian.  VM left  Spoke with Gena Fray [phone]. At baseline patient ambulates with RW.  Patient has lived at [facility] approximately 8 years.  PT eval pending  At discharge will need fl2 with updated meds Contant Butch Penny for report and see where to send DC summary and fl2 Per Butch Penny if patient discharges over the week they may not have transportation. Brother confirms he will be available to transport       Patient Goals and CMS Choice        Expected Discharge Plan and Services                                                Prior Living Arrangements/Services                       Activities of Daily Living      Permission Sought/Granted                  Emotional Assessment              Admission diagnosis:  UTI (urinary tract infection) [N39.0] Acute cystitis without hematuria [N30.00] Altered mental status, unspecified altered mental status type [R41.82] Patient Active Problem List   Diagnosis Date Noted   UTI (urinary tract infection) 24/58/0998   Acute metabolic encephalopathy 33/82/5053   Intertriginous dermatitis associated with moisture 02/13/2022   COVID-19 virus infection 02/13/2022   Hypertension    Pain due to onychomycosis of toenails of both feet 12/14/2020   History of abnormal mammogram 01/28/2019   Recurrent UTI 01/28/2019   Chronic kidney disease (CKD), stage III (moderate) (HCC) 11/11/2018   Long-term use of high-risk medication 10/13/2018   Anxiety 07/31/2017   Anxiety and depression  10/16/2016   GERD (gastroesophageal reflux disease) 10/16/2016   Hyperlipidemia associated with type 2 diabetes mellitus ([place]) 08/29/2016   Moderate intellectual disabilities 11/15/2015   IDA (iron deficiency anemia) 09/22/2014   Closed fracture of distal end of left humerus 06/08/2014   Headache 11/30/2013   Breast cancer ([place]) 04/18/2013   History of Clostridium difficile 10/30/2012   Symptomatic cholelithiasis 10/26/2012   Closed fracture of condyle of femur ([place]) 10/05/2012   Closed fracture of proximal tibia 10/05/2012   Cognitive deficits 10/05/2012   Diabetes mellitus ([place]) 10/05/2012   Hypertension associated with diabetes ([place]) 10/05/2012   Hyperlipidemia, unspecified 10/05/2012   Obesity 10/05/2012   Vitamin D deficiency, unspecified 10/05/2012   Urinary tract infection 10/05/2012   Left tibial fracture 09/10/2012   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 [address] Phone: [phone] Fax: [phone]     Social Determinants of Health (SDOH)  Interventions    Readmission Risk Interventions     No data to display

## 2022-02-14 NOTE — ED Notes (Signed)
Pt alert, in bed eating breakfast.

## 2022-02-14 NOTE — Consult Note (Signed)
ORTHOPAEDIC CONSULTATION  REQUESTING PHYSICIAN: Lorella Nimrod, MD  Chief Complaint:   Left thigh pain.  History of Present Illness: Carly Collins is a 66 y.o. female with multiple medical problems including mild mental retardation, diabetes, hypertension, hyperlipidemia, anxiety/depression, recurrent UTIs, vitamin D deficiency, and osteoporosis who lives in a group home.  The patient normally ambulates with a rolling walker and is quite active.  Apparently, the patient was diagnosed with COVID about 10 days ago.  Over the past 1 to 2 days, the patient became increasingly confused and apparently fell several times.  She was brought to the emergency room where she was diagnosed with a UTI and admitted for IV antibiotics yesterday.  Temp was made to mobilize the patient with physical therapy earlier today but the patient complained of significant left thigh pain.  X-rays were obtained and demonstrated a mildly displaced supracondylar femur fracture.  Therefore, orthopedic consultation has been obtained.  The patient is a poor historian, but acknowledges pain in the left knee with any active or passive motion.  She denies any numbness or paresthesias to her foot.  Past Medical History:  Diagnosis Date   Anxiety    C. difficile enteritis    Cancer ([place])    Depression    Diabetes mellitus without complication (HCC)    GERD (gastroesophageal reflux disease)    Headache    Hyperlipidemia    Hypertension    Mental retardation, mild (I.Q. 50-70)    Moderate intellectual disabilities    Obesity    Recurrent UTI    Tibial fracture    Vitamin D deficiency    Past Surgical History:  Procedure Laterality Date   ABDOMINAL SURGERY     APPENDECTOMY     BREAST SURGERY     COLON SURGERY     COLONOSCOPY N/A 10/26/2014   Procedure: COLONOSCOPY;  Surgeon: Lollie Sails, MD;  Location: [facility] ENDOSCOPY;  Service: Endoscopy;  Laterality: N/A;    COLONOSCOPY N/A 01/21/2020   Procedure: COLONOSCOPY;  Surgeon: Robert Bellow, MD;  Location: [facility] ENDOSCOPY;  Service: Endoscopy;  Laterality: N/A;   DILATION AND CURETTAGE OF UTERUS     ESOPHAGOGASTRODUODENOSCOPY N/A 01/21/2020   Procedure: ESOPHAGOGASTRODUODENOSCOPY (EGD);  Surgeon: Robert Bellow, MD;  Location: [facility] ENDOSCOPY;  Service: Endoscopy;  Laterality: N/A;   ESOPHAGOGASTRODUODENOSCOPY (EGD) WITH PROPOFOL N/A 10/26/2014   Procedure: ESOPHAGOGASTRODUODENOSCOPY (EGD) WITH PROPOFOL;  Surgeon: Lollie Sails, MD;  Location: [facility] ENDOSCOPY;  Service: Endoscopy;  Laterality: N/A;   FRACTURE SURGERY     Social History   Socioeconomic History   Marital status: Single    Spouse name: Not on file   Number of children: Not on file   Years of education: Not on file   Highest education level: Not on file  Occupational History   Not on file  Tobacco Use   Smoking status: Never   Smokeless tobacco: Never  Vaping Use   Vaping Use: Never used  Substance and Sexual Activity   Alcohol use: No   Drug use: No   Sexual activity: Not Currently  Other Topics Concern   Not on file  Social History Narrative   Not on file   Social Determinants of Health   Financial Resource Strain: Not on file  Food Insecurity: No Food Insecurity (02/14/2022)   Hunger Vital Sign    Worried About Running Out of Food in the Last Year: Never true    Ran Out of Food in the Last Year: Never true  Transportation Needs: Unknown (02/14/2022)   PRAPARE - Hydrologist (Medical): Patient refused    Lack of Transportation (Non-Medical): Patient refused  Physical Activity: Not on file  Stress: Not on file  Social Connections: Not on file   Family History  Problem Relation Age of Onset   Other Mother    Other Father    Allergies  Allergen Reactions   Pioglitazone Other (See Comments)    unknown Unknown to patient    Prior to Admission medications   Medication Sig  Start Date End Date Taking? Authorizing Provider  acetaminophen (MAPAP) 500 MG tablet Take 1,000 mg by mouth 3 (three) times daily.   Yes [provider]  acetaminophen (TYLENOL) 325 MG tablet Take 325-650 mg by mouth every 6 (six) hours as needed for mild pain or moderate pain.   Yes [provider]  alendronate (FOSAMAX) 70 MG tablet Take 70 mg by mouth every Saturday.   Yes [provider]  brexpiprazole (REXULTI) 1 MG TABS tablet Take 1 mg by mouth in the morning.   Yes [provider]  clonazePAM (KLONOPIN) 1 MG tablet Take 0.5-1 mg by mouth See admin instructions. Take 1 tablet ('1mg'$ ) by mouth every morning and take  tablet (0.'5mg'$ ) by mouth every night at bedtime   Yes [provider]  Dentifrices (BIOTENE DRY MOUTH) PSTE Place 1 Application onto teeth 3 (three) times daily.   Yes [provider]  divalproex (DEPAKOTE SPRINKLE) 125 MG capsule Take 250-375 mg by mouth See admin instructions. Take 2 capsules ('250mg'$ ) by mouth every morning, 2 capsules ('250mg'$ ) every day at noon then take 3 capsules ('375mg'$ ) by mouth every night at bedtime   Yes [provider]  docusate sodium (COLACE) 100 MG capsule Take 100 mg by mouth 2 (two) times daily as needed for mild constipation.   Yes [provider]  donepezil (ARICEPT) 10 MG tablet Take 5 mg by mouth 2 (two) times daily.   Yes [provider]  DULoxetine (CYMBALTA) 30 MG capsule Take 30 mg by mouth daily. (Take with '60mg'$  capsule to equal '90mg'$  total)   Yes [provider]  DULoxetine (CYMBALTA) 60 MG capsule Take 60 mg by mouth daily. (Take with '30mg'$  capsule to equal '90mg'$  total)   Yes [provider]  ferrous gluconate (FERGON) 240 (27 FE) MG tablet Take 240 mg by mouth daily.   Yes [provider]  folic acid (FOLVITE) 102 MCG tablet Take 800 mcg by mouth daily.   Yes [provider]  gabapentin (NEURONTIN) 300 MG capsule Take 600 mg by  mouth 2 (two) times daily as needed (agitation lasting longer than 10 minutes).   Yes [provider]  guaiFENesin (DIABETIC TUSSIN EX) 100 MG/5ML liquid Take 10 mLs by mouth every 6 (six) hours as needed for cough or to loosen phlegm.   Yes [provider]  letrozole (FEMARA) 2.5 MG tablet Take 2.5 mg by mouth daily.   Yes [provider]  loperamide (IMODIUM) 2 MG capsule Take 2 mg by mouth as needed for diarrhea or loose stools.   Yes [provider]  magnesium hydroxide (MILK OF MAGNESIA) 400 MG/5ML suspension Take 30 mLs by mouth daily as needed for mild constipation or moderate constipation.   Yes [provider]  memantine (NAMENDA) 10 MG tablet Take 10 mg by mouth 2 (two) times daily.   Yes [provider]  mirtazapine (REMERON) 7.5 MG tablet Take 7.5  mg by mouth at bedtime.   Yes [provider]  molnupiravir EUA (LAGEVRIO) 200 mg CAPS capsule Take 4 capsules by mouth every 12 (twelve) hours. 02/12/22 02/17/22 Yes [provider]  Mouthwashes (BIOTENE/CALCIUM PBF) LIQD Use as directed 15 mLs in the mouth or throat 2 (two) times daily.   Yes [provider]  nystatin (MYCOSTATIN/NYSTOP) powder Apply 1 Application topically every 12 (twelve) hours as needed (fungal symptoms in skin folds).   Yes [provider]  nystatin-triamcinolone (MYCOLOG II) cream Apply 1 Application topically 2 (two) times daily as needed (skin eruptions).   Yes [provider]  pantoprazole (PROTONIX) 40 MG tablet Take 40 mg by mouth in the morning.   Yes [provider]  phenylephrine-shark liver oil-mineral oil-petrolatum (PREPARATION H) 0.25-14-74.9 % rectal ointment Place 1 Application rectally daily as needed for hemorrhoids.   Yes [provider]  polyethylene glycol (MIRALAX / GLYCOLAX) packet Take 17 g by mouth at bedtime.   Yes [provider]  pseudoephedrine (SUDAFED) 30 MG tablet Take 30  mg by mouth every 6 (six) hours as needed for congestion.   Yes [provider]  QUEtiapine (SEROQUEL) 300 MG tablet Take 300 mg by mouth at bedtime.   Yes [provider]  QUEtiapine (SEROQUEL) 50 MG tablet Take 50 mg by mouth 2 (two) times daily.   Yes [provider]  vitamin D3 (CHOLECALCIFEROL) 25 MCG tablet Take 1,000 Units by mouth daily.   Yes [provider]  sitaGLIPtin (JANUVIA) 100 MG tablet Take 100 mg by mouth daily.  01/17/19  [provider]   DG FEMUR MIN 2 VIEWS LEFT  Result Date: 02/14/2022 CLINICAL DATA:  Distal femur pain post fall EXAM: LEFT FEMUR 2 VIEWS COMPARISON:  Low FINDINGS: Osseous demineralization. Degenerative changes at LEFT knee joint. Prior ORIF proximal tibia. Oblique displaced fracture distal LEFT femoral metaphysis with overlying soft tissue swelling. Proximal femur is intact. No dislocation identified. IMPRESSION: Oblique mildly displaced distal LEFT femoral metaphyseal fracture. Osseous demineralization. Electronically Signed   By: Lavonia Dana M.D.   On: 02/14/2022 13:34   DG Pelvis 1-2 Views  Result Date: 02/14/2022 CLINICAL DATA:  Pain post fall EXAM: PELVIS - 1-2 VIEW COMPARISON:  None Available. FINDINGS: Osseous demineralization. Mild narrowing of hip joints bilaterally. SI joints preserved. No acute fracture, dislocation, or bone destruction. Degenerative disc disease changes and mild scoliosis at visualized lower lumbar spine. Significantly increased stool in colon and rectum. IMPRESSION: Osseous mineralization with mild degenerative changes of BILATERAL hip joints. No acute osseous findings. Significantly increased stool in colon and rectum. Electronically Signed   By: Lavonia Dana M.D.   On: 02/14/2022 13:32   DG Chest 2 View  Result Date: 02/13/2022 CLINICAL DATA:  Cough, COVID positive EXAM: CHEST - 2 VIEW COMPARISON:  01/26/2021 FINDINGS: Cardiac size is within normal limits. Lung fields are clear of any  infiltrates or pulmonary edema. There is no pleural effusion or pneumothorax. Dextroscoliosis is seen in thoracic spine. Calcification is noted in left paratracheal region in the lower neck, possibly in thyroid. Surgical clips are seen in right chest wall. IMPRESSION: No active cardiopulmonary disease. Electronically Signed   By: Elmer Picker M.D.   On: 02/13/2022 11:54   CT Head Wo Contrast  Result Date: 02/13/2022 CLINICAL DATA:  Altered mental status. EXAM: CT HEAD WITHOUT CONTRAST TECHNIQUE: Contiguous axial images were obtained from the base of the skull through the vertex without intravenous contrast. RADIATION DOSE REDUCTION: This exam  was performed according to the departmental dose-optimization program which includes automated exposure control, adjustment of the mA and/or kV according to patient size and/or use of iterative reconstruction technique. COMPARISON:  None Available. FINDINGS: Brain: No evidence of acute infarction, hemorrhage, hydrocephalus, extra-axial collection or mass lesion/mass effect. Vascular: No hyperdense vessel or unexpected calcification. Skull: Normal. Negative for fracture or focal lesion. Sinuses/Orbits: Probable bilateral ethmoid and maxillary sinusitis. Other: None. IMPRESSION: No acute intracranial abnormality seen. Electronically Signed   By: Marijo Conception M.D.   On: 02/13/2022 11:49    Positive ROS: All other systems have been reviewed and were otherwise negative with the exception of those mentioned in the HPI and as above.  Physical Exam: General:  Alert, no acute distress Psychiatric:  Patient is not competent for consent, but exhibits normal mood and affect   Cardiovascular:  No pedal edema Respiratory:  No wheezing, non-labored breathing GI:  Abdomen is soft and non-tender Skin:  No lesions in the area of chief complaint Neurologic:  Sensation intact distally Lymphatic:  No axillary or cervical lymphadenopathy  Orthopedic Exam:  Orthopedic  examination is limited to the left thigh and lower extremity.  There is moderate swelling to the thigh, but no erythema, ecchymosis, abrasions, or other skin abnormalities are identified.  She has moderate tenderness to palpation around the distal thigh region and has more severe pain with any attempted active or passive motion of the leg or knee.  She is able to actively dorsiflex and plantarflex her toes and ankle.  Sensation is intact to light touch to all distributions.  She has good capillary refill to her left foot.  X-rays:  AP and lateral x-rays of the left femur are available for review and have been reviewed by myself.  These films demonstrate a mildly displaced supracondylar left femur fracture which does not appear to extend into the joint based on these films.  There is evidence of implanted surgical hardware in the proximal tibia, presumably to treat a tibial plateau fracture which is now well-healed.  Mild to moderate degenerative changes of the knee also are noted.  Assessment: Closed displaced left supracondylar femur fracture.  Plan: The treatment options have been discussed with the patient, although she is of limited capacity mentally and is not competent to sign her own consent.  However, given the patient's difficulty following instructions, I feel that it would be in her best interest to have the fracture stabilized surgically so that she would be able to be mobilized more quickly.  I have spoken with nurse Butch Penny at [facility] [phone]) and explained the patient's situation to her.  She will reach out to the patient's nephew who apparently is the patient's power of attorney to have him call the floor to give consent for the surgery.  Thank you for asking me to participate in the care of this most pleasant yet unfortunate woman.  I will be happy to follow her with you.    Pascal Lux, MD  Beeper #:  [phone]  02/14/2022 5:09 PM

## 2022-02-14 NOTE — Hospital Course (Addendum)
Taken from H&P.  Carly Collins is a 66 y.o. female with medical history significant of cognitive impairment, hypertension, hyperlipidemia, diet-controlled diabetes, GERD, depression with anxiety, recurrent UTI, C. difficile colitis, iron deficiency anemia, breast cancer, who presents with fall and confusion.   Patient has history of cognitive impairment, cannot provide accurate medical history.  Unable to reach any family member so history was obtained from chart review.Per report, patient had positive COVID test 1 week ago. She has mild dry cough, does not seem to have shortness of breath and chest pain.  Today her COVID test is negative.  Patient was found to be more confused than her baseline in facility.  She fell twice in facility. Unclear if she hit her head or had loss of consciousness. Patient has dysuria and suprapubic abdominal pain.  Data reviewed independently and ED Course: pt was found to have positive urinalysis (hazy appearance, large amount of leukocyte, positive nitrite, rare bacteria, WBC> 50), negative COVID PCR, WBC 6.6, GFR> 60, temperature normal, blood pressure 110/88, heart rate 58, RR 19, oxygen saturation 98% on room air.  Chest x-ray negative.  CT head is negative.   10/5: Patient was complaining of left leg pain and unable to bear weight.  Her left leg seems swollen.  Imaging was ordered and found to have minimally displaced left lower femoral fracture. Orthopedic was consulted. Preliminary urine cultures with E. coli pending susceptibility. 10/6: Hemodynamically stable.  Orthopedic surgery is recommending surgery to stabilize her fracture.  Message sent to her nephew who is also her POA for concent. Patient will be going to the OR later today.  10/7: S/p ORIF yesterday.  Patient will be nonweightbearing on left lower extremity.  Urine cultures with pansensitive E. Coli. Hemoglobin dropped to 6.8 from 8 postoperatively-ordered 1 unit of PRBC.  PT and OT are recommending  SNF. TOC consult for placement.  10/8: Patient remained stable.  Hemoglobin improved to 9.  Restarting Lovenox as she is high risk for DVT. Patient will remain nonweightbearing on the left lower extremity per orthopedic surgery.  10/9: Hemodynamically stable, appears pale so CBC was rechecked and hemoglobin at 8.4.  Increasing the dose of ferrous sulfate to twice daily.  PT and OT are recommending SNF.  Patient will remain nonweightbearing on left lower extremity and will follow-up with orthopedic surgery as an outpatient in 2 weeks. Talk with mother on phone who herself is 23 years old.  She was very upset with [place] that she fell in the hospital and broke her leg.  I tried explaining multiple times that she fell at the group home but she was keeps repeating that group home called her and told her that she fell in the hospital not at group home?? Patient was admitted from group home with a complaint of fall and altered mental status.  10/10: Patient remained stable.  Working with PT.  [name] is working on SNF placement.  Hemoglobin seems stable around 8.3.  10/11: Remains stable.  Still no bed offers.  10/13: Patient remained stable.  She is being discharged to rehab for further management before returning back to her group home.  She will remain nonweightbearing on left lower extremity.  She will continue Lovenox 40 mg daily for DVT prophylaxis for 14 days and follow-up with can order clinic orthopedic surgery in 2 weeks for further recommendations.  Patient will continue on her current management and will follow-up with her providers.

## 2022-02-14 NOTE — Progress Notes (Signed)
Progress Note   Patient: Carly Collins DOB: 10-24-1955 DOA: 02/13/2022     0 DOS: the patient was seen and examined on 02/14/2022   Brief hospital course: Taken from H&P.  Carly Collins is a 66 y.o. female with medical history significant of cognitive impairment, hypertension, hyperlipidemia, diet-controlled diabetes, GERD, depression with anxiety, recurrent UTI, C. difficile colitis, iron deficiency anemia, breast cancer, who presents with fall and confusion.   Patient has history of cognitive impairment, cannot provide accurate medical history.  Unable to reach any family member so history was obtained from chart review.Per report, patient had positive COVID test 1 week ago. She has mild dry cough, does not seem to have shortness of breath and chest pain.  Today her COVID test is negative.  Patient was found to be more confused than her baseline in facility.  She fell twice in facility. Unclear if she hit her head or had loss of consciousness. Patient has dysuria and suprapubic abdominal pain.  Data reviewed independently and ED Course: pt was found to have positive urinalysis (hazy appearance, large amount of leukocyte, positive nitrite, rare bacteria, WBC> 50), negative COVID PCR, WBC 6.6, GFR> 60, temperature normal, blood pressure 110/88, heart rate 58, RR 19, oxygen saturation 98% on room air.  Chest x-ray negative.  CT head is negative.   10/5: Patient was complaining of left leg pain and unable to bear weight.  Her left leg seems swollen.  Imaging was ordered and found to have minimally displaced left lower femoral fracture. Orthopedic was consulted. Preliminary urine cultures with E. coli pending susceptibility.   Assessment and Plan: * UTI (urinary tract infection) Urine cultures growing E. coli-pending susceptibility Patient has recurrent UTI.  No sepsis. -Continue with Rocephin for now-we will de-escalate once susceptibilities available  Femoral distal fracture  ([place]) Most likely secondary to fall.  Patient with significant left leg pain and unable to bear weight. Imaging with concern of minimally displaced left distal femoral fracture. -Orthopedic consult -Pain management  Acute metabolic encephalopathy Per report, patient is more confused than baseline.  Patient has history of cognitive impairment. Not sure about baseline mental status.  CT head negative.  Likely due to UTI Able to communicate somewhat today.  Hypertension Blood pressure within goal.  Pt is not taking meds now - IV hydralazine as needed  Breast cancer (HCC) -continue letrozole  Anxiety and depression - Continue home medications:  IDA (iron deficiency anemia) Hemoglobin 8.7 (9.5 on 11/06/2019) - Continue iron supplement  Intertriginous dermatitis associated with moisture - Topical nystatin powder  Cognitive deficits Continue donepezil, Namenda  COVID-19 virus infection Patient has mild dry cough, no fever.  Oxygen saturation 98% on room air.  Chest x-ray negative.  Patient was started on molnupiravir on 10/3 in facility.  Today COVID test is negative. -Will not continue molnupiravir -As needed albuterol and Mucinex   Subjective: Patient seen and examined today.  Oriented to self and place.  She was complaining of left leg pain more around her left knee.  Physical Exam: Vitals:   02/14/22 0843 02/14/22 1219 02/14/22 1342 02/14/22 1402  BP: 118/60 131/60 (!) 136/54 (!) 141/64  Pulse: 79 97 72 75  Resp: '18 18 18 18  '$ Temp: 98.3 F (36.8 C)  98.6 F (37 C) 98.6 F (37 C)  TempSrc:   Oral Oral  SpO2: 97% 100% 98% 94%  Weight:      Height:       General.  Cognitively impaired lady,  in no acute distress. Pulmonary.  Lungs clear bilaterally, normal respiratory effort. CV.  Regular rate and rhythm, no JVD, rub or murmur. Abdomen.  Soft, nontender, nondistended, BS positive. CNS.  Alert and oriented .  No focal neurologic deficit. Extremities.  Left leg edema,  no cyanosis, pulses intact and symmetrical. Psychiatry.  Judgment and insight appears impaired.  Data Reviewed: Prior data reviewed  Family Communication: Tried calling brother with no response.  Disposition: Status is: Inpatient Remains inpatient appropriate because:    Planned Discharge Destination: Home  DVT prophylaxis.  Lovenox Time spent: 50 minutes  This record has been created using Systems analyst. Errors have been sought and corrected,but may not always be located. Such creation errors do not reflect on the standard of care.  Author: Lorella Nimrod, MD 02/14/2022 3:50 PM  For on call review [email or web address].

## 2022-02-14 NOTE — ED Notes (Addendum)
Pt changed into fresh gown, pureWick change, pt reoriented, provided with a snack. Pt accidentally removed IV.

## 2022-02-14 NOTE — Assessment & Plan Note (Signed)
Most likely secondary to fall.  Patient with significant left leg pain and unable to bear weight. Imaging with concern of minimally displaced left distal femoral fracture. -Orthopedic consult -Pain management

## 2022-02-14 NOTE — ED Notes (Signed)
Randa Ngo, RN messaged to review chart.

## 2022-02-14 NOTE — Plan of Care (Signed)

## 2022-02-14 NOTE — Progress Notes (Signed)
PT Cancellation Note  Patient Details Name: Carly Collins MRN: [id number] DOB: 05-Nov-1955   Cancelled Treatment:    Reason Eval/Treat Not Completed: Pain limiting ability to participate (Consult received and chart reviewed. Patient noted with significant edema to L hip/knee; obvious pain with any attempts at active/passive movement (prefers resting in flexed position).  Unable to tolerate mobility assessment at this time.  MD informed/aware of findings; patient to benefit from imaging to rule out bony injury given presentation and recent fall history.  Will hold until imaging complete and patient fully cleared for progressive activity.)  Carly Collins, PT, DPT, NCS 02/14/22, 12:40 PM [phone]

## 2022-02-15 ENCOUNTER — Inpatient Hospital Stay: Payer: Medicare Other | Admitting: Anesthesiology

## 2022-02-15 ENCOUNTER — Encounter
Admission: EM | Disposition: A | Discharge status: Skilled Nursing Facility | Payer: Self-pay | Source: Home / Self Care | Attending: Internal Medicine

## 2022-02-15 ENCOUNTER — Other Ambulatory Visit: Payer: Self-pay

## 2022-02-15 ENCOUNTER — Encounter: Payer: Self-pay | Admitting: Internal Medicine

## 2022-02-15 ENCOUNTER — Inpatient Hospital Stay: Payer: Medicare Other

## 2022-02-15 DIAGNOSIS — S72402A Unspecified fracture of lower end of left femur, initial encounter for closed fracture: Secondary | ICD-10-CM | POA: Diagnosis not present

## 2022-02-15 DIAGNOSIS — N3000 Acute cystitis without hematuria: Secondary | ICD-10-CM | POA: Diagnosis not present

## 2022-02-15 HISTORY — PX: ORIF FEMUR FRACTURE: SHX2119

## 2022-02-15 LAB — CBC
HCT: 24.8 % — ABNORMAL LOW (ref 36.0–46.0)
Hemoglobin: 8.1 g/dL — ABNORMAL LOW (ref 12.0–15.0)
MCH: 30.3 pg (ref 26.0–34.0)
MCHC: 32.7 g/dL (ref 30.0–36.0)
MCV: 92.9 fL (ref 80.0–100.0)
Platelets: 148 10*3/uL — ABNORMAL LOW (ref 150–400)
RBC: 2.67 MIL/uL — ABNORMAL LOW (ref 3.87–5.11)
RDW: 14 % (ref 11.5–15.5)
WBC: 6.4 10*3/uL (ref 4.0–10.5)
nRBC: 0 % (ref 0.0–0.2)

## 2022-02-15 LAB — GLUCOSE, CAPILLARY
Glucose-Capillary: 77 mg/dL (ref 70–99)
Glucose-Capillary: 142 mg/dL — ABNORMAL HIGH (ref 70–99)
Glucose-Capillary: 54 mg/dL — ABNORMAL LOW (ref 70–99)
Glucose-Capillary: 153 mg/dL — ABNORMAL HIGH (ref 70–99)

## 2022-02-15 LAB — RETICULOCYTES
Immature Retic Fract: 22.4 % — ABNORMAL HIGH (ref 2.3–15.9)
RBC.: 2.62 MIL/uL — ABNORMAL LOW (ref 3.87–5.11)
Retic Count, Absolute: 38.8 10*3/uL (ref 19.0–186.0)
Retic Ct Pct: 1.5 % (ref 0.4–3.1)

## 2022-02-15 LAB — IRON AND TIBC
Iron: 32 ug/dL (ref 28–170)
Saturation Ratios: 15 % (ref 10.4–31.8)
TIBC: 220 ug/dL — ABNORMAL LOW (ref 250–450)
UIBC: 188 ug/dL

## 2022-02-15 LAB — VITAMIN B12: Vitamin B-12: 553 pg/mL (ref 180–914)

## 2022-02-15 LAB — URINE CULTURE: Culture: >=100000 — AB

## 2022-02-15 LAB — FERRITIN: Ferritin: 92 ng/mL (ref 11–307)

## 2022-02-15 LAB — FOLATE: Folate: 24 ng/mL (ref >5.9)

## 2022-02-15 LAB — C-REACTIVE PROTEIN: CRP: 9.4 mg/dL — ABNORMAL HIGH (ref <1.0)

## 2022-02-15 SURGERY — OPEN REDUCTION INTERNAL FIXATION (ORIF) DISTAL FEMUR FRACTURE
Anesthesia: General | Laterality: Left

## 2022-02-15 MED ORDER — DIPHENHYDRAMINE HCL 12.5 MG/5ML PO ELIX: 12.5000 mg | ORAL_SOLUTION | ORAL | Status: DC | PRN

## 2022-02-15 MED ORDER — DOCUSATE SODIUM 100 MG PO CAPS
100.0000 mg | ORAL_CAPSULE | Freq: Two times a day (BID) | ORAL | Status: DC
  Administered 2022-02-15 – 2022-02-22 (×14): 100 mg via ORAL
  Filled 2022-02-15 (×14): qty 1

## 2022-02-15 MED ORDER — ONDANSETRON HCL 4 MG PO TABS: 4.0000 mg | ORAL_TABLET | Freq: Four times a day (QID) | ORAL | Status: DC | PRN

## 2022-02-15 MED ORDER — OXYCODONE HCL 5 MG PO TABS
5.0000 mg | ORAL_TABLET | Freq: Once | ORAL | Status: AC | PRN
  Administered 2022-02-15: 5 mg via ORAL

## 2022-02-15 MED ORDER — ONDANSETRON HCL 4 MG/2ML IJ SOLN: 4.0000 mg | Freq: Once | INTRAMUSCULAR | Status: DC | PRN

## 2022-02-15 MED ORDER — DEXAMETHASONE SODIUM PHOSPHATE 10 MG/ML IJ SOLN
INTRAMUSCULAR | Status: AC
  Filled 2022-02-15: qty 1

## 2022-02-15 MED ORDER — TRANEXAMIC ACID 1000 MG/10ML IV SOLN
INTRAVENOUS | Status: DC | PRN
  Administered 2022-02-15: 1000 mg via INTRAVENOUS

## 2022-02-15 MED ORDER — TRANEXAMIC ACID 1000 MG/10ML IV SOLN
INTRAVENOUS | Status: AC
  Filled 2022-02-15: qty 10

## 2022-02-15 MED ORDER — ENOXAPARIN SODIUM 40 MG/0.4ML IJ SOSY
40.0000 mg | PREFILLED_SYRINGE | INTRAMUSCULAR | Status: DC
  Administered 2022-02-16: 40 mg via SUBCUTANEOUS
  Filled 2022-02-15: qty 0.4

## 2022-02-15 MED ORDER — BISACODYL 10 MG RE SUPP: 10.0000 mg | Freq: Every day | RECTAL | Status: DC | PRN

## 2022-02-15 MED ORDER — BUPIVACAINE-EPINEPHRINE (PF) 0.5% -1:200000 IJ SOLN
INTRAMUSCULAR | Status: AC
  Filled 2022-02-15: qty 30

## 2022-02-15 MED ORDER — DEXMEDETOMIDINE HCL IN NACL 200 MCG/50ML IV SOLN
INTRAVENOUS | Status: DC | PRN
  Administered 2022-02-15: 8 ug via INTRAVENOUS
  Administered 2022-02-15: 4 ug via INTRAVENOUS
  Administered 2022-02-15: 8 ug via INTRAVENOUS

## 2022-02-15 MED ORDER — LIDOCAINE HCL (CARDIAC) PF 100 MG/5ML IV SOSY
PREFILLED_SYRINGE | INTRAVENOUS | Status: DC | PRN
  Administered 2022-02-15: 100 mg via INTRAVENOUS

## 2022-02-15 MED ORDER — DEXAMETHASONE SODIUM PHOSPHATE 10 MG/ML IJ SOLN
INTRAMUSCULAR | Status: DC | PRN
  Administered 2022-02-15: 10 mg via INTRAVENOUS

## 2022-02-15 MED ORDER — GLYCOPYRROLATE 0.2 MG/ML IJ SOLN
INTRAMUSCULAR | Status: DC | PRN
  Administered 2022-02-15: .2 mg via INTRAVENOUS

## 2022-02-15 MED ORDER — FENTANYL CITRATE (PF) 100 MCG/2ML IJ SOLN
INTRAMUSCULAR | Status: AC
  Administered 2022-02-15: 25 ug via INTRAVENOUS
  Filled 2022-02-15: qty 2

## 2022-02-15 MED ORDER — DEXTROSE 50 % IV SOLN
INTRAVENOUS | Status: AC
  Administered 2022-02-15: 50 mL via INTRAVENOUS
  Filled 2022-02-15: qty 50

## 2022-02-15 MED ORDER — DEXTROSE 50 % IV SOLN: 1.0000 | Freq: Once | INTRAVENOUS | Status: AC

## 2022-02-15 MED ORDER — MORPHINE SULFATE (PF) 2 MG/ML IV SOLN: 1.0000 mg | INTRAVENOUS | Status: DC | PRN

## 2022-02-15 MED ORDER — METOCLOPRAMIDE HCL 5 MG PO TABS: 5.0000 mg | ORAL_TABLET | Freq: Three times a day (TID) | ORAL | Status: DC | PRN

## 2022-02-15 MED ORDER — SODIUM CHLORIDE 0.9 % IV SOLN: INTRAVENOUS | Status: DC

## 2022-02-15 MED ORDER — ONDANSETRON HCL 4 MG/2ML IJ SOLN
INTRAMUSCULAR | Status: DC | PRN
  Administered 2022-02-15: 4 mg via INTRAVENOUS

## 2022-02-15 MED ORDER — FENTANYL CITRATE (PF) 100 MCG/2ML IJ SOLN
INTRAMUSCULAR | Status: DC | PRN
  Administered 2022-02-15 (×2): 50 ug via INTRAVENOUS

## 2022-02-15 MED ORDER — LACTATED RINGERS IV SOLN: INTRAVENOUS | Status: DC

## 2022-02-15 MED ORDER — MAGNESIUM HYDROXIDE 400 MG/5ML PO SUSP
30.0000 mL | Freq: Every day | ORAL | Status: DC | PRN
  Administered 2022-02-19: 30 mL via ORAL
  Filled 2022-02-15: qty 30

## 2022-02-15 MED ORDER — GLYCOPYRROLATE 0.2 MG/ML IJ SOLN
INTRAMUSCULAR | Status: AC
  Filled 2022-02-15: qty 1

## 2022-02-15 MED ORDER — 0.9 % SODIUM CHLORIDE (POUR BTL) OPTIME
TOPICAL | Status: DC | PRN
  Administered 2022-02-15: 500 mL

## 2022-02-15 MED ORDER — ACETAMINOPHEN 10 MG/ML IV SOLN
INTRAVENOUS | Status: AC
  Filled 2022-02-15: qty 100

## 2022-02-15 MED ORDER — METOCLOPRAMIDE HCL 5 MG/ML IJ SOLN: 5.0000 mg | Freq: Three times a day (TID) | INTRAMUSCULAR | Status: DC | PRN

## 2022-02-15 MED ORDER — LACTATED RINGERS IV SOLN: INTRAVENOUS | Status: DC | PRN

## 2022-02-15 MED ORDER — SUGAMMADEX SODIUM 200 MG/2ML IV SOLN
INTRAVENOUS | Status: DC | PRN
  Administered 2022-02-15: 150 mg via INTRAVENOUS

## 2022-02-15 MED ORDER — CEFAZOLIN SODIUM-DEXTROSE 2-4 GM/100ML-% IV SOLN
2.0000 g | Freq: Four times a day (QID) | INTRAVENOUS | Status: AC
  Administered 2022-02-15 – 2022-02-16 (×3): 2 g via INTRAVENOUS
  Filled 2022-02-15 (×3): qty 100

## 2022-02-15 MED ORDER — FLEET ENEMA 7-19 GM/118ML RE ENEM: 1.0000 | ENEMA | Freq: Once | RECTAL | Status: DC | PRN

## 2022-02-15 MED ORDER — ROCURONIUM BROMIDE 10 MG/ML (PF) SYRINGE
PREFILLED_SYRINGE | INTRAVENOUS | Status: AC
  Filled 2022-02-15: qty 10

## 2022-02-15 MED ORDER — EPHEDRINE SULFATE (PRESSORS) 50 MG/ML IJ SOLN
INTRAMUSCULAR | Status: DC | PRN
  Administered 2022-02-15: 10 mg via INTRAVENOUS

## 2022-02-15 MED ORDER — CEFAZOLIN SODIUM-DEXTROSE 2-4 GM/100ML-% IV SOLN
INTRAVENOUS | Status: AC
  Filled 2022-02-15: qty 100

## 2022-02-15 MED ORDER — ACETAMINOPHEN 10 MG/ML IV SOLN
INTRAVENOUS | Status: DC | PRN
  Administered 2022-02-15: 1000 mg via INTRAVENOUS

## 2022-02-15 MED ORDER — PROPOFOL 10 MG/ML IV BOLUS
INTRAVENOUS | Status: DC | PRN
  Administered 2022-02-15: 100 mg via INTRAVENOUS

## 2022-02-15 MED ORDER — BUPIVACAINE-EPINEPHRINE (PF) 0.5% -1:200000 IJ SOLN
INTRAMUSCULAR | Status: DC | PRN
  Administered 2022-02-15: 50 mL

## 2022-02-15 MED ORDER — OXYCODONE HCL 5 MG PO TABS
ORAL_TABLET | ORAL | Status: AC
  Filled 2022-02-15: qty 1

## 2022-02-15 MED ORDER — ACETAMINOPHEN 500 MG PO TABS
500.0000 mg | ORAL_TABLET | Freq: Four times a day (QID) | ORAL | Status: AC
  Administered 2022-02-15 – 2022-02-16 (×4): 500 mg via ORAL
  Filled 2022-02-15 (×4): qty 1

## 2022-02-15 MED ORDER — FENTANYL CITRATE (PF) 100 MCG/2ML IJ SOLN
25.0000 ug | INTRAMUSCULAR | Status: DC | PRN
  Administered 2022-02-15 (×2): 25 ug via INTRAVENOUS

## 2022-02-15 MED ORDER — FENTANYL CITRATE (PF) 100 MCG/2ML IJ SOLN
INTRAMUSCULAR | Status: AC
  Filled 2022-02-15: qty 2

## 2022-02-15 MED ORDER — KETOROLAC TROMETHAMINE 15 MG/ML IJ SOLN
INTRAMUSCULAR | Status: AC
  Administered 2022-02-15: 7.5 mg via INTRAVENOUS
  Filled 2022-02-15: qty 1

## 2022-02-15 MED ORDER — ACETAMINOPHEN 325 MG PO TABS
325.0000 mg | ORAL_TABLET | Freq: Four times a day (QID) | ORAL | Status: DC | PRN
  Administered 2022-02-19 – 2022-02-21 (×2): 650 mg via ORAL
  Filled 2022-02-15 (×2): qty 2

## 2022-02-15 MED ORDER — OXYCODONE HCL 5 MG/5ML PO SOLN: 5.0000 mg | Freq: Once | ORAL | Status: AC | PRN

## 2022-02-15 MED ORDER — KETOROLAC TROMETHAMINE 15 MG/ML IJ SOLN
7.5000 mg | Freq: Four times a day (QID) | INTRAMUSCULAR | Status: AC
  Administered 2022-02-16 (×2): 7.5 mg via INTRAVENOUS
  Filled 2022-02-15 (×3): qty 1

## 2022-02-15 MED ORDER — ONDANSETRON HCL 4 MG/2ML IJ SOLN
4.0000 mg | Freq: Four times a day (QID) | INTRAMUSCULAR | Status: DC | PRN
  Administered 2022-02-15 – 2022-02-16 (×2): 4 mg via INTRAVENOUS
  Filled 2022-02-15 (×2): qty 2

## 2022-02-15 MED ORDER — PROPOFOL 10 MG/ML IV BOLUS
INTRAVENOUS | Status: AC
  Filled 2022-02-15: qty 20

## 2022-02-15 MED ORDER — ROCURONIUM BROMIDE 100 MG/10ML IV SOLN
INTRAVENOUS | Status: DC | PRN
  Administered 2022-02-15: 30 mg via INTRAVENOUS
  Administered 2022-02-15: 20 mg via INTRAVENOUS

## 2022-02-15 MED ORDER — BUPIVACAINE LIPOSOME 1.3 % IJ SUSP
INTRAMUSCULAR | Status: AC
  Filled 2022-02-15: qty 20

## 2022-02-15 MED ORDER — LIDOCAINE HCL (PF) 2 % IJ SOLN
INTRAMUSCULAR | Status: AC
  Filled 2022-02-15: qty 5

## 2022-02-15 MED ORDER — ONDANSETRON HCL 4 MG/2ML IJ SOLN
INTRAMUSCULAR | Status: AC
  Filled 2022-02-15: qty 2

## 2022-02-15 MED ORDER — ACETAMINOPHEN 10 MG/ML IV SOLN: 1000.0000 mg | Freq: Once | INTRAVENOUS | Status: DC | PRN

## 2022-02-15 SURGICAL SUPPLY — 55 items
BIT DRILL 4.3 (BIT) ×1
BIT DRILL 4.3X300MM (BIT) IMPLANT
BIT DRILL LONG 3.3 (BIT) IMPLANT
BIT DRILL QC 3.3X195 (BIT) IMPLANT
BNDG COHESIVE 6X5 TAN ST LF (GAUZE/BANDAGES/DRESSINGS) ×1 IMPLANT
BNDG ELASTIC 6X5.8 VLCR STR LF (GAUZE/BANDAGES/DRESSINGS) IMPLANT
BRACE KNEE POST OP SHORT (BRACE) ×1 IMPLANT
CAP LOCK NCB (Cap) IMPLANT
CHLORAPREP W/TINT 26 (MISCELLANEOUS) ×1 IMPLANT
COVER BACK TABLE REUSABLE LG (DRAPES) ×1 IMPLANT
DRAPE C-ARM XRAY 36X54 (DRAPES) ×1 IMPLANT
DRAPE C-ARMOR (DRAPES) ×1 IMPLANT
DRAPE INCISE IOBAN 66X60 STRL (DRAPES) ×2 IMPLANT
DRAPE ORTHO SPLIT 77X108 STRL (DRAPES) ×2
DRAPE SURG ORHT 6 SPLT 77X108 (DRAPES) ×2 IMPLANT
DRAPE U-SHAPE 47X51 STRL (DRAPES) ×1 IMPLANT
DRSG OPSITE POSTOP 4X6 (GAUZE/BANDAGES/DRESSINGS) ×1 IMPLANT
DRSG OPSITE POSTOP 4X8 (GAUZE/BANDAGES/DRESSINGS) IMPLANT
ELECT REM PT RETURN 9FT ADLT (ELECTROSURGICAL) ×1
ELECTRODE REM PT RTRN 9FT ADLT (ELECTROSURGICAL) ×1 IMPLANT
GAUZE 4X4 16PLY ~~LOC~~+RFID DBL (SPONGE) ×1 IMPLANT
GAUZE SPONGE 4X4 12PLY STRL (GAUZE/BANDAGES/DRESSINGS) ×1 IMPLANT
GAUZE XEROFORM 1X8 LF (GAUZE/BANDAGES/DRESSINGS) ×1 IMPLANT
GLOVE BIO SURGEON STRL SZ8 (GLOVE) ×2 IMPLANT
GLOVE SURG UNDER LTX SZ8 (GLOVE) ×1 IMPLANT
GOWN STRL REUS W/ TWL LRG LVL3 (GOWN DISPOSABLE) ×1 IMPLANT
GOWN STRL REUS W/ TWL XL LVL3 (GOWN DISPOSABLE) ×1 IMPLANT
GOWN STRL REUS W/TWL LRG LVL3 (GOWN DISPOSABLE) ×1
GOWN STRL REUS W/TWL XL LVL3 (GOWN DISPOSABLE) ×1
HOLSTER ELECTROSUGICAL PENCIL (MISCELLANEOUS) ×1 IMPLANT
K-WIRE 2.0 (WIRE) ×1
K-WIRE FXSTD 280X2XNS SS (WIRE) ×1
KIT TURNOVER KIT A (KITS) ×1 IMPLANT
KWIRE FXSTD 280X2XNS SS (WIRE) IMPLANT
MANIFOLD NEPTUNE II (INSTRUMENTS) ×1 IMPLANT
NS IRRIG 1000ML POUR BTL (IV SOLUTION) ×1 IMPLANT
PACK HIP PROSTHESIS (MISCELLANEOUS) ×1 IMPLANT
PLATE DIST FEM 12H (Plate) IMPLANT
SCREW 5.0 80MM (Screw) IMPLANT
SCREW CORTICAL NCB 5.0X40 (Screw) IMPLANT
SCREW CORTICAL NCB 5.0X65 (Screw) IMPLANT
SCREW NCB 4.0X40MM (Screw) IMPLANT
SCREW NCB 5.0X36MM (Screw) IMPLANT
SCREW NCB 5.0X38 (Screw) IMPLANT
SCREW NCB 5.0X46MM (Screw) IMPLANT
SCREW NCB 5.0X85MM (Screw) IMPLANT
SPACER CENTRALIZER STEM 3X64 (Spacer) IMPLANT
SPONGE T-LAP 18X18 ~~LOC~~+RFID (SPONGE) ×5 IMPLANT
STAPLER SKIN PROX 35W (STAPLE) ×1 IMPLANT
STOCKINETTE M/LG 89821 (MISCELLANEOUS) ×1 IMPLANT
SUT VIC AB 0 CT1 36 (SUTURE) IMPLANT
SUT VIC AB 2-0 CT1 27 (SUTURE) ×2
SUT VIC AB 2-0 CT1 TAPERPNT 27 (SUTURE) ×2 IMPLANT
TRAP FLUID SMOKE EVACUATOR (MISCELLANEOUS) ×1 IMPLANT
WATER STERILE IRR 500ML POUR (IV SOLUTION) ×1 IMPLANT

## 2022-02-15 NOTE — Anesthesia Procedure Notes (Signed)
Procedure Name: Intubation Date/Time: 02/15/2022 4:50 PM  Performed by: Esaw Grandchild, CRNAPre-anesthesia Checklist: Patient identified, Emergency Drugs available, Suction available and Patient being monitored Patient Re-evaluated:Patient Re-evaluated prior to induction Oxygen Delivery Method: Circle system utilized Preoxygenation: Pre-oxygenation with 100% oxygen Induction Type: IV induction Ventilation: Mask ventilation without difficulty, Oral airway inserted - appropriate to patient size and Two handed mask ventilation required Laryngoscope Size: McGraph and 3 Grade View: Grade II Tube type: Oral Tube size: 7.0 mm Number of attempts: 1 Airway Equipment and Method: Stylet, Oral airway and Bite block Placement Confirmation: ETT inserted through vocal cords under direct vision, positive ETCO2 and breath sounds checked- equal and bilateral Secured at: 20 cm Tube secured with: Tape Dental Injury: Teeth and Oropharynx as per pre-operative assessment

## 2022-02-15 NOTE — Assessment & Plan Note (Addendum)
S/p ORIF on 02/15/2022. Most likely secondary to fall.  Imaging with concern of minimally displaced left distal femoral fracture. Patient will be NWB on left lower extremity. -PT/OT are recommending SNF -Continue with pain management

## 2022-02-15 NOTE — Transfer of Care (Signed)
Immediate Anesthesia Transfer of Care Note  Patient: Carly Collins  Procedure(s) Performed: OPEN REDUCTION INTERNAL FIXATION (ORIF) DISTAL FEMUR FRACTURE (Left)  Patient Location: PACU  Anesthesia Type:General  Level of Consciousness: drowsy  Airway & Oxygen Therapy: Patient Spontanous Breathing and Patient connected to face mask oxygen  Post-op Assessment: Report given to RN, Post -op Vital signs reviewed and stable and Patient moving all extremities  Post vital signs: Reviewed and stable  Last Vitals:  Vitals Value Taken Time  BP 153/65 02/15/22 1916  Temp    Pulse 72 02/15/22 1921  Resp 13 02/15/22 1921  SpO2 100 % 02/15/22 1921  Vitals shown include unvalidated device data.  Last Pain:  Vitals:   02/15/22 1339  TempSrc: Temporal  PainSc:       Patients Stated Pain Goal: 0 (67/25/50 0164)  Complications: No notable events documented.

## 2022-02-15 NOTE — Assessment & Plan Note (Signed)
Blood pressure within goal.  Pt is not taking meds now - IV hydralazine as needed

## 2022-02-15 NOTE — Anesthesia Postprocedure Evaluation (Signed)
Anesthesia Post Note  Patient: VALERYA MAXTON  Procedure(s) Performed: OPEN REDUCTION INTERNAL FIXATION (ORIF) DISTAL FEMUR FRACTURE (Left)  Patient location during evaluation: PACU Anesthesia Type: General Level of consciousness: awake and alert Pain management: pain level controlled Vital Signs Assessment: post-procedure vital signs reviewed and stable Respiratory status: spontaneous breathing, nonlabored ventilation, respiratory function stable and patient connected to nasal cannula oxygen Cardiovascular status: blood pressure returned to baseline and stable Postop Assessment: no apparent nausea or vomiting Anesthetic complications: no   No notable events documented.   Last Vitals:  Vitals:   02/15/22 2051 02/15/22 2127  BP: (!) 141/66 124/61  Pulse: 73 79  Resp: 17 17  Temp:  36.4 C  SpO2: 100% 95%    Last Pain:  Vitals:   02/15/22 2127  TempSrc: Axillary  PainSc:                  Dimas Millin

## 2022-02-15 NOTE — Assessment & Plan Note (Addendum)
Urine cultures with pansensitive E. coli Patient has recurrent UTI.  No sepsis. -Antibiotics switched to Keflex to complete a 5-day course.

## 2022-02-15 NOTE — Progress Notes (Signed)
PT Cancellation Note  Patient Details Name: Carly Collins MRN: [id number] DOB: [birth year]   Cancelled Treatment:    Reason Eval/Treat Not Completed: Medical issues which prohibited therapy (Per chart review, imaging remarkable for distal femur fracture; pending surgical repair this date. Will complete initial PT order; please re-consult as medically appropriate once surgery complete and patient appropriate for mobilization.)  Braedyn Kauk H. Owens Shark, PT, DPT, NCS 02/15/22, 8:40 AM [phone]

## 2022-02-15 NOTE — Assessment & Plan Note (Signed)
Improved.  Seems to be at baseline now Per report, patient is more confused than baseline.  Patient has history of cognitive impairment. Not sure about baseline mental status.  CT head negative.  Likely due to UTI

## 2022-02-15 NOTE — Anesthesia Preprocedure Evaluation (Addendum)
Anesthesia Evaluation  Patient identified by MRN, date of birth, ID band Patient awake    Reviewed: Allergy & Precautions, NPO status , Patient's Chart, lab work & pertinent test results  History of Anesthesia Complications Negative for: history of anesthetic complications  Airway Mallampati: IV   Neck ROM: Full    Dental  (+) Poor Dentition   Pulmonary neg pulmonary ROS,    Pulmonary exam normal breath sounds clear to auscultation       Cardiovascular hypertension, Normal cardiovascular exam Rhythm:Regular Rate:Normal  ECG 02/13/22:  Sinus bradycardia Otherwise normal ECG   Neuro/Psych  Headaches, PSYCHIATRIC DISORDERS Anxiety Depression Intellectual disability; retinopathy    GI/Hepatic GERD  ,  Endo/Other  diabetes, Type 2  Renal/GU Renal disease (stage III CKD)     Musculoskeletal   Abdominal   Peds  Hematology  (+) Blood dyscrasia, anemia ,   Anesthesia Other Findings   Reproductive/Obstetrics                            Anesthesia Physical Anesthesia Plan  ASA: 3  Anesthesia Plan: General   Post-op Pain Management:    Induction: Intravenous  PONV Risk Score and Plan: 3 and Ondansetron, Dexamethasone and Treatment may vary due to age or medical condition  Airway Management Planned: Oral ETT  Additional Equipment:   Intra-op Plan:   Post-operative Plan: Extubation in OR  Informed Consent: I have reviewed the patients History and Physical, chart, labs and discussed the procedure including the risks, benefits and alternatives for the proposed anesthesia with the patient or authorized representative who has indicated his/her understanding and acceptance.     Dental advisory given and Consent reviewed with POA  Plan Discussed with: CRNA  Anesthesia Plan Comments: (Patient's brother and POA, Kellianne Ek, consented via phone for risks of anesthesia including but not limited  to:  - adverse reactions to medications - damage to eyes, teeth, lips or other oral mucosa - nerve damage due to positioning  - sore throat or hoarseness - damage to heart, brain, nerves, lungs, other parts of body or loss of life  Informed POA about role of CRNA in peri- and intra-operative care; he voiced understanding.)       Anesthesia Quick Evaluation

## 2022-02-15 NOTE — Op Note (Signed)
02/15/2022  7:25 PM  Patient:   Carly Collins  Pre-Op Diagnosis:   Closed displaced left supracondylar femur fracture.  Post-Op Diagnosis:   Same  Procedure:   Open reduction and internal fixation of displaced left distal supracondylar femur fracture.  Surgeon:   Pascal Lux, MD  Assistant:   None  Anesthesia:   GET  Findings:   As above.  Complications:   None  Fluids:   800 cc crystalloid  EBL:   100 cc  UOP:   None  TT:   None  Drains:   None  Closure:   Staples  Implants:   [employer] 12-hole precontoured periprosthetic supracondylar femoral plate with six distal screws with locking caps and five bicortical proximal screws  Brief Clinical Note:   The patient is a 66 year old female with a history of mild mental retardation who sustained the above-noted injury several days ago following a fall at her group home. The patient was brought to the emergency room and subsequently admitted. X-rays demonstrated the above-noted injury. The patient has been cleared medically and presents at this time for definitive management of his injury.  Procedure:   The patient was brought into the operating room and lain in the supine position. After adequate general endotracheal intubation and anesthesia were obtained, the patient's left lower extremity was prepped with ChloraPrep solution before being draped sterilely.  Preoperative antibiotics were administered. A timeout was performed to verify the appropriate surgical site.   An approximately 10-12 cm incision was made along the lateral aspect of the distal femur, extending down to the joint line laterally. The incision was carried down through the subcutaneous tissues to expose the iliotibial band. This was split the length of the incision to expose the lateral aspect of the lateral femoral condyle. A 12-hole [employer] precontoured periprosthetic supracondylar femoral plate was selected. The plate was carefully positioned  utilizing fluoroscopic imaging in AP and lateral projections before it was temporarily secured using a K wire distally and a 3.5 millimeter screw proximally.    A 5 mm spacer was placed just proximal to the fracture site to help reduce the fracture as optimally as possible. Distally, the plate was secured using six fully threaded cancellus screws. The adequacy of screw position was verified fluoroscopically in AP and lateral projections and found to be excellent. One screw had to be removed as it was too long and it was replaced with a shorter screw.  Each of the screws was covered with a locking cap.  Proximally, the plate was secured using five bicortical screws of appropriate length. The most proximal three screws were placed through a short incision while the fourth screw was placed through a separate stab incision. The fifth screw was placed through the most proximal end of the more distal incision. Again the adequacy of hardware position and overall femoral alignment/fracture reduction was verified fluoroscopically in AP and lateral projections and found to be excellent.  The wounds were copiously irrigated with sterile saline solution using bulb irrigation before the iliotibial band was reapproximated using #0 Vicryl interrupted sutures. The subcutaneous tissues were closed in two layers using 2-0 Vicryl interrupted sutures before the skin was closed with staples. A total of 30 cc of 0.5% Sensorcaine with epinephrine mixed with 20 cc of Exparel was injected in around the incision sites to help with postoperative analgesia before a sterile occlusive dressing was applied to the thigh. The patient was placed into a hinged knee brace with hinges  set at 0 to 90 degrees, but locked in extension. The patient was then awakened, extubated, and returned to the recovery room in satisfactory condition after tolerating the procedure well.

## 2022-02-15 NOTE — Progress Notes (Signed)
Progress Note   Patient: Carly Collins GUR:[id number] DOB: [birth year] DOA: 02/13/2022     1 DOS: the patient was seen and examined on 02/15/2022   Brief hospital course: Taken from H&P.  JACK MINEAU is a 66 y.o. female with medical history significant of cognitive impairment, hypertension, hyperlipidemia, diet-controlled diabetes, GERD, depression with anxiety, recurrent UTI, C. difficile colitis, iron deficiency anemia, breast cancer, who presents with fall and confusion.   Patient has history of cognitive impairment, cannot provide accurate medical history.  Unable to reach any family member so history was obtained from chart review.Per report, patient had positive COVID test 1 week ago. She has mild dry cough, does not seem to have shortness of breath and chest pain.  Today her COVID test is negative.  Patient was found to be more confused than her baseline in facility.  She fell twice in facility. Unclear if she hit her head or had loss of consciousness. Patient has dysuria and suprapubic abdominal pain.  Data reviewed independently and ED Course: pt was found to have positive urinalysis (hazy appearance, large amount of leukocyte, positive nitrite, rare bacteria, WBC> 50), negative COVID PCR, WBC 6.6, GFR> 60, temperature normal, blood pressure 110/88, heart rate 58, RR 19, oxygen saturation 98% on room air.  Chest x-ray negative.  CT head is negative.   10/5: Patient was complaining of left leg pain and unable to bear weight.  Her left leg seems swollen.  Imaging was ordered and found to have minimally displaced left lower femoral fracture. Orthopedic was consulted. Preliminary urine cultures with E. coli pending susceptibility. 10/6: Hemodynamically stable.  Orthopedic surgery is recommending surgery to stabilize her fracture.  Message sent to her nephew who is also her POA for concent. Patient will be going to the OR later today.    Assessment and Plan: * UTI (urinary tract  infection) Urine cultures with pansensitive E. coli Patient has recurrent UTI.  No sepsis. -Continue with Rocephin for now.,  We can switch to Keflex after the surgery.  Femoral distal fracture ([place]) Most likely secondary to fall.  Patient with significant left leg pain and unable to bear weight. Imaging with concern of minimally displaced left distal femoral fracture. Orthopedic was consulted and patient is going to OR later today. -Pain management  Acute metabolic encephalopathy Improved.  Seems to be at baseline now Per report, patient is more confused than baseline.  Patient has history of cognitive impairment. Not sure about baseline mental status.  CT head negative.  Likely due to UTI   Hypertension Blood pressure within goal.  Pt is not taking meds now - IV hydralazine as needed  Breast cancer (HCC) -continue letrozole  Anxiety and depression - Continue home medications:  IDA (iron deficiency anemia) Hemoglobin 8.7 (9.5 on 11/06/2019) - Continue iron supplement  Intertriginous dermatitis associated with moisture - Topical nystatin powder  Cognitive deficits Continue donepezil, Namenda  COVID-19 virus infection Patient has mild dry cough, no fever.  Oxygen saturation 98% on room air.  Chest x-ray negative.  Patient was started on molnupiravir on 10/3 in facility.  Today COVID test is negative. -Will not continue molnupiravir -As needed albuterol and Mucinex   Subjective: Patient was seen and examined today.  Continued to have left leg pain.  I tried explaining about surgery, not sure how much she get it but stating okay.  Physical Exam: Vitals:   02/14/22 1845 02/15/22 0313 02/15/22 0744 02/15/22 1339  BP: 135/66 139/82 (!) 129/59 (!) 148/58  Pulse: 86 74 70 68  Resp: '18 18 16 16  '$ Temp: 98.4 F (36.9 C) 98.4 F (36.9 C) 97.8 F (36.6 C) 97.7 F (36.5 C)  TempSrc:    Temporal  SpO2: 92%  93% 98%  Weight:      Height:       General.  Cognitively  impaired daily, in no acute distress. Pulmonary.  Lungs clear bilaterally, normal respiratory effort. CV.  Regular rate and rhythm, no JVD, rub or murmur. Abdomen.  Soft, nontender, nondistended, BS positive. CNS.  Alert and oriented .  No focal neurologic deficit. Extremities.  No edema, no cyanosis, pulses intact and symmetrical. Psychiatry.  Judgment and insight appears impaired.  Data Reviewed: Prior data reviewed  Family Communication: Unable to reach any family member on phone.  Disposition: Status is: Inpatient Remains inpatient appropriate because:    Planned Discharge Destination: Home  DVT prophylaxis.  Lovenox Time spent: 45 minutes  This record has been created using Systems analyst. Errors have been sought and corrected,but may not always be located. Such creation errors do not reflect on the standard of care.  Author: Lorella Nimrod, MD 02/15/2022 3:28 PM  For on call review [email or web address].

## 2022-02-16 DIAGNOSIS — S72402A Unspecified fracture of lower end of left femur, initial encounter for closed fracture: Secondary | ICD-10-CM | POA: Diagnosis not present

## 2022-02-16 DIAGNOSIS — N3000 Acute cystitis without hematuria: Secondary | ICD-10-CM | POA: Diagnosis not present

## 2022-02-16 LAB — CBC
HCT: 20.7 % — ABNORMAL LOW (ref 36.0–46.0)
Hemoglobin: 6.8 g/dL — ABNORMAL LOW (ref 12.0–15.0)
MCH: 30.2 pg (ref 26.0–34.0)
MCHC: 32.9 g/dL (ref 30.0–36.0)
MCV: 92 fL (ref 80.0–100.0)
Platelets: 158 10*3/uL (ref 150–400)
RBC: 2.25 MIL/uL — ABNORMAL LOW (ref 3.87–5.11)
RDW: 13.8 % (ref 11.5–15.5)
WBC: 8.5 10*3/uL (ref 4.0–10.5)
nRBC: 0 % (ref 0.0–0.2)

## 2022-02-16 LAB — PREPARE RBC (CROSSMATCH)

## 2022-02-16 LAB — GLUCOSE, CAPILLARY: Glucose-Capillary: 107 mg/dL — ABNORMAL HIGH (ref 70–99)

## 2022-02-16 LAB — HEMOGLOBIN AND HEMATOCRIT, BLOOD
HCT: 22.3 % — ABNORMAL LOW (ref 36.0–46.0)
Hemoglobin: 7.5 g/dL — ABNORMAL LOW (ref 12.0–15.0)

## 2022-02-16 LAB — ABO/RH: ABO/RH(D): O NEG

## 2022-02-16 MED ORDER — SODIUM CHLORIDE 0.9% IV SOLUTION: Freq: Once | INTRAVENOUS | Status: AC

## 2022-02-16 MED ORDER — CEPHALEXIN 500 MG PO CAPS
500.0000 mg | ORAL_CAPSULE | Freq: Three times a day (TID) | ORAL | Status: AC
  Administered 2022-02-16 – 2022-02-18 (×6): 500 mg via ORAL
  Filled 2022-02-16 (×6): qty 1

## 2022-02-16 NOTE — Evaluation (Signed)
Occupational Therapy Evaluation Patient Details Name: Carly Collins MRN: [id number] DOB: 01-14-1956 Today's Date: 02/16/2022   History of Present Illness Pt is a 66 y/o F admitted on 02/13/22 after presenting following fall & confusion. Pt  found to have positive urinalysis as well as minimally displaced L lower femoral fx.  Pt underwent L ORIF 02/15/22 by Dr. Roland Rack & is now NWB LLE. PMH: significant cognitive impairment, HTN, HLD, diet controlled diabetes, GERD, depression with anxiety, recurrent UTI, c diff colitis, iron deficiency anemia, breast CA   Clinical Impression   Chart reviewed, pt greeted in bed agreeable to OT evaluation. Co tx completed with PT. Pt is oriented to self only, is a poor report throughout. Pt reports "yes" to all questions asked but pt does report "I dont feel so good". L hinged knee brace applied, locked in extension throughout. Brace adjusted as edema noted. Pt requires MIN A for bed mobility, STS with MIN A, short amb transfer with MIN A, poor adherence to [place] precautions. MOD A required for UB dressing, MAX A for LB dressing. Pt presents with deficits in strength, endurance, activity tolerance, balance all affecting safe and optimal ADL completion. Pt would benefit from [facility] in familiar environment with 24/7 assistance, adaptive equipment, MIN A for transfers. Spoke with Langley Gauss, nurse from group home, reports staff will likely be able to provide the support required, they have a consult PT on staff however provides mostly consult based services, reports house policy will not allow [place] therapy in the house. Open to outpatient therapy in the future with support from consult PT. If house is unable to provide recommended assist, recommend STR. Also recommend mwc, shower chair, bsc,      Recommendations for follow up therapy are one component of a multi-disciplinary discharge planning process, led by the attending physician.  Recommendations may be updated based on patient  status, additional functional criteria and insurance authorization.   Follow Up Recommendations  Skilled nursing-short term rehab (<3 hours/day) (if staff is able to provide 24/7 supervision, minimal assist with transfers [place] is preferred and recommended as pt will likely benefit from familiar environment)    Assistance Recommended at Discharge Frequent or constant Supervision/Assistance  Patient can return home with the following A little help with walking and/or transfers;A little help with bathing/dressing/bathroom    Functional Status Assessment  Patient has had a recent decline in their functional status and demonstrates the ability to make significant improvements in function in a reasonable and predictable amount of time.  Equipment Recommendations  BSC/3in1;Tub/shower seat;Wheelchair (measurements OT)    Recommendations for Other Services       Precautions / Restrictions Precautions Precautions: Fall Precaution Comments: LLE hinged knee brace locked in extension, can remove to perform gentle ROM with therapy Restrictions Weight Bearing Restrictions: Yes LLE Weight Bearing: Non weight bearing      Mobility Bed Mobility Overal bed mobility: Needs Assistance Bed Mobility: Supine to Sit     Supine to sit: Min assist          Transfers Overall transfer level: Needs assistance Equipment used: Rolling walker (2 wheels) Transfers: Sit to/from Stand, Bed to chair/wheelchair/BSC Sit to Stand: +2 physical assistance, Min assist, +2 safety/equipment     Step pivot transfers: Min assist, +2 safety/equipment, +2 physical assistance            Balance   Sitting-balance support: Feet supported, Bilateral upper extremity supported Sitting balance-Leahy Scale: Fair     Standing balance support: Bilateral upper  extremity supported, During functional activity Standing balance-Leahy Scale: Poor                             ADL either performed or assessed  with clinical judgement   ADL Overall ADL's : Needs assistance/impaired Eating/Feeding: Set up;Sitting   Grooming: Wash/dry face;Sitting;Set up           Upper Body Dressing : Moderate assistance;Sitting   Lower Body Dressing: Maximal assistance   Toilet Transfer: Minimal assistance;+2 for safety/equipment Toilet Transfer Details (indicate cue type and reason): step by step vcs for safety, pt poor adherence to [place] status                 Vision Patient Visual Report: No change from baseline Additional Comments: will continue to assess     Perception     Praxis      Pertinent Vitals/Pain Pain Assessment Pain Assessment: Faces Faces Pain Scale: Hurts even more Pain Location: LLE Pain Descriptors / Indicators: Discomfort Pain Intervention(s): Limited activity within patient's tolerance, RN gave pain meds during session, Monitored during session, Repositioned     Hand Dominance     Extremity/Trunk Assessment Upper Extremity Assessment Upper Extremity Assessment: Overall WFL for tasks assessed   Lower Extremity Assessment Lower Extremity Assessment: LLE deficits/detail LLE Deficits / Details: edema noted throughout LLE, L hinge break donned and locked in extension throughout   Cervical / Trunk Assessment Cervical / Trunk Assessment: Normal   Communication Communication Communication: Expressive difficulties   Cognition Arousal/Alertness: Awake/alert Behavior During Therapy: Flat affect Overall Cognitive Status: No family/caregiver present to determine baseline cognitive functioning Area of Impairment: Orientation, Attention, Memory, Following commands, Safety/judgement, Awareness, Problem solving                 Orientation Level: Disoriented to, Place, Time, Situation Current Attention Level: Focused   Following Commands: Follows one step commands with increased time Safety/Judgement: Decreased awareness of deficits Awareness:  Intellectual Problem Solving: Slow processing, Decreased initiation, Difficulty sequencing, Requires verbal cues, Requires tactile cues General Comments: restponds "yes" to all questions, reports he stomach, LLE hurts and states "I dont feel good"     General Comments  spo2 >90% on RA, BP 139/62 in bed    Exercises     Shoulder Instructions      Home Living Family/patient expects to be discharged to:: Group home                                 Additional Comments: Pt is a poor historian, chart review shows pt lives at [facility], may require assistance for ADL/IADL unable to contact group home, TOC notified .      Prior Functioning/Environment Prior Level of Function : Needs assist             Mobility Comments: Pt is poor historian & unable to contact anyone at [facility].          OT Problem List: Decreased strength;Decreased activity tolerance;Decreased knowledge of use of DME or AE;Decreased safety awareness;Decreased knowledge of precautions      OT Treatment/Interventions: Self-care/ADL training;Patient/family education;Therapeutic exercise;Balance training;Therapeutic activities;DME and/or AE instruction    OT Goals(Current goals can be found in the care plan section) Acute Rehab OT Goals Patient Stated Goal: eat OT Goal Formulation: With patient Time For Goal Achievement: 03/02/22 Potential to  Achieve Goals: Good ADL Goals Pt Will Perform Grooming: sitting;with supervision Pt Will Perform Lower Body Dressing: with min assist;sit to/from stand Pt Will Transfer to Toilet: with min assist Pt Will Perform Toileting - Clothing Manipulation and hygiene: with mod assist;sit to/from stand  OT Frequency: Min 2X/week    Co-evaluation PT/OT/SLP Co-Evaluation/Treatment: Yes Reason for Co-Treatment: For patient/therapist safety;Necessary to address cognition/behavior during functional activity;Complexity of the  patient's impairments (multi-system involvement) PT goals addressed during session: Mobility/safety with mobility;Balance;Proper use of DME OT goals addressed during session: ADL's and self-care      AM-PAC OT "6 Clicks" Daily Activity     Outcome Measure Help from another person eating meals?: None Help from another person taking care of personal grooming?: A Little Help from another person toileting, which includes using toliet, bedpan, or urinal?: A Lot Help from another person bathing (including washing, rinsing, drying)?: A Lot Help from another person to put on and taking off regular upper body clothing?: A Little Help from another person to put on and taking off regular lower body clothing?: A Lot 6 Click Score: 16   End of Session Equipment Utilized During Treatment: Rolling walker (2 wheels) Nurse Communication: Mobility status  Activity Tolerance: Patient tolerated treatment well Patient left: in chair;with call bell/phone within reach;with chair alarm set  OT Visit Diagnosis: Unsteadiness on feet (R26.81);History of falling (Z91.81)                Time: 3299-2426 OT Time Calculation (min): 24 min Charges:  OT General Charges $OT Visit: 1 Visit OT Evaluation $OT Eval Moderate Complexity: 1 Mod  Shanon Payor, OTD OTR/L  02/16/22, 1:13 PM

## 2022-02-16 NOTE — Assessment & Plan Note (Signed)
Patient has mild dry cough, no fever.  Oxygen saturation 98% on room air.  Chest x-ray negative.  Patient was started on molnupiravir on 10/3 in facility.  Today COVID test is negative. -Will not continue molnupiravir -As needed albuterol and Mucinex

## 2022-02-16 NOTE — Progress Notes (Signed)
   Subjective: 1 Day Post-Op Procedure(s) (LRB): OPEN REDUCTION INTERNAL FIXATION (ORIF) DISTAL FEMUR FRACTURE (Left) Patient reports pain as mild and moderate.   Patient is well, and has had no acute complaints or problems Denies any CP, SOB, ABD pain. We will continue therapy today.   Objective: Vital signs in last 24 hours: Temp:  [97.6 F (36.4 C)-98.9 F (37.2 C)] 97.8 F (36.6 C) (10/07 0459) Pulse Rate:  [68-92] 72 (10/07 0459) Resp:  [14-24] 14 (10/07 0459) BP: (114-163)/(29-94) 137/71 (10/07 0459) SpO2:  [87 %-100 %] 97 % (10/07 0459)  Intake/Output from previous day: 10/06 0701 - 10/07 0700 In: 1560 [P.O.:225; I.V.:1125; IV Piggyback:210] Out: 100 [Blood:100] Intake/Output this shift: No intake/output data recorded.  Recent Labs    02/13/22 1124 02/14/22 1416 02/15/22 0823  HGB 8.7* 7.9* 8.1*   Recent Labs    02/14/22 1416 02/15/22 0823  WBC 4.7 6.4  RBC 2.63* 2.67*  2.62*  HCT 24.5* 24.8*  PLT 133* 148*   Recent Labs    02/13/22 1124  NA 137  K 4.0  CL 104  CO2 30  BUN 31*  CREATININE 0.99  GLUCOSE 116*  CALCIUM 8.2*   No results for input(s): "LABPT", "INR" in the last 72 hours.  EXAM General - Patient is Alert, Appropriate, and Oriented Extremity - Neurovascular intact Sensation intact distally Intact pulses distally Dorsiflexion/Plantar flexion intact No cellulitis present Compartment soft Knee immobilizer intact Dressing - dressing C/D/I and no drainage Motor Function - intact, moving foot and toes well on exam.   Past Medical History:  Diagnosis Date   Anxiety    C. difficile enteritis    Cancer ([place])    Depression    Diabetes mellitus without complication (HCC)    GERD (gastroesophageal reflux disease)    Headache    Hyperlipidemia    Hypertension    Mental retardation, mild (I.Q. 50-70)    Moderate intellectual disabilities    Obesity    Recurrent UTI    Tibial fracture    Vitamin D deficiency      Assessment/Plan:   1 Day Post-Op Procedure(s) (LRB): OPEN REDUCTION INTERNAL FIXATION (ORIF) DISTAL FEMUR FRACTURE (Left) Principal Problem:   UTI (urinary tract infection) Active Problems:   Anxiety and depression   Breast cancer (HCC)   Cognitive deficits   IDA (iron deficiency anemia)   Acute metabolic encephalopathy   Hypertension   Intertriginous dermatitis associated with moisture   COVID-19 virus infection   Femoral distal fracture (HCC)  Estimated body mass index is 24.6 kg/m as calculated from the following:   Height as of this encounter: '5\' 2"'$  (1.575 m).   Weight as of this encounter: 61 kg. Advance diet Up with therapy, NWB LLE Pain controlled VSS Acute post op blood loss anemia with underlying chronic anemia - Hgb 8.1. Recheck Hgb in the am. CM to assist with discharge       DVT Prophylaxis - Lovenox, TED hose, and SCDs Non-Weight bearing Left lower extremity   T. Rachelle Hora, PA-C [place] 02/16/2022, 7:21 AM

## 2022-02-16 NOTE — Evaluation (Signed)
Physical Therapy Evaluation Patient Details Name: Carly Collins MRN: [id number] DOB: [birth year] Today's Date: 02/16/2022  History of Present Illness  Pt is a 66 y/o F admitted on 02/13/22 after presenting following fall & confusion. Pt  found to have positive urinalysis as well as minimally displaced L lower femoral fx.  Pt underwent L ORIF 02/15/22 by Dr. Roland Rack & is now NWB LLE. PMH: significant cognitive impairment, HTN, HLD, diet controlled diabetes, GERD, depression with anxiety, recurrent UTI, c diff colitis, iron deficiency anemia, breast CA  Clinical Impression  Pt seen for PT evaluation with co-tx with OT. Pt unable to report PLOF/home set-up and answers yes to most questions but does endorse LLE & belly pain. Pt with LLE hinged knee brace donned but pt reporting it's too tight; PT & OT adjusted brace for more comfort. Pt noted to have red indentations on either side of knee & with LLE edema -- PA notified via secure chat, as well as nurse. Pt is able to complete supine>sit with HOB elevated with bed rails with min assist to assist with moving LLE to EOB. PT/OT attempt to educate pt on NWB LLE & how to transfer while maintaining precautions but pt unable to do so, especially during stand pivot transfer to recliner. Pt limited by not feeling well & provided pt with sprite & crackers.  Attempted to contact pt's group home via telephone but without success. If staff at [facility] services can provide 24 hr assistance pt would benefit from HHPT & would likely to do better in a familiar environment. If they cannot provide assistance, pt will require STR upon d/c.   Recommendations for follow up therapy are one component of a multi-disciplinary discharge planning process, led by the attending physician.  Recommendations may be updated based on patient status, additional functional criteria and insurance authorization.  Follow Up Recommendations Skilled nursing-short term rehab (<3 hours/day) (HHPT is  preferred if staff with [facility] can provide 24 hr assistance at d/c) Can patient physically be transported by private vehicle: No    Assistance Recommended at Discharge Frequent or constant Supervision/Assistance  Patient can return home with the following  Two people to help with walking and/or transfers;A little help with bathing/dressing/bathroom;Help with stairs or ramp for entrance    Equipment Recommendations Rolling walker (2 wheels);Wheelchair cushion (measurements PT);Wheelchair (measurements PT) (elevating leg rests)  Recommendations for Other Services  OT consult    Functional Status Assessment Patient has had a recent decline in their functional status and demonstrates the ability to make significant improvements in function in a reasonable and predictable amount of time.     Precautions / Restrictions Precautions Precautions: Fall Precaution Comments: LLE hinged knee brace locked in extension, can remove to perform gentle ROM with therapy Restrictions Weight Bearing Restrictions: Yes LLE Weight Bearing: Non weight bearing      Mobility  Bed Mobility Overal bed mobility: Needs Assistance Bed Mobility: Supine to Sit     Supine to sit: Min assist (min assist to move LLE to EOB, HOB elevated with use of bed rails)          Transfers Overall transfer level: Needs assistance Equipment used: Rolling walker (2 wheels) Transfers: Sit to/from Stand, Bed to chair/wheelchair/BSC Sit to Stand: +2 physical assistance, Min assist, +2 safety/equipment   Step pivot transfers: Min assist, +2 safety/equipment, +2 physical assistance       General transfer comment: PT attempts to place foot under pt's LLE to prevent pt from  weight bearing but without success. Pt takes many shuffled steps with BLE to pivot to recliner.    Ambulation/Gait                  Stairs            Wheelchair Mobility    Modified Rankin (Stroke Patients Only)        Balance Overall balance assessment: Needs assistance Sitting-balance support: Feet supported, Bilateral upper extremity supported Sitting balance-Leahy Scale: Fair Sitting balance - Comments: supervision static sitting   Standing balance support: Bilateral upper extremity supported, During functional activity Standing balance-Leahy Scale: Poor                               Pertinent Vitals/Pain Pain Assessment Pain Assessment: Faces Faces Pain Scale: Hurts even more Pain Location: LLE Pain Descriptors / Indicators: Discomfort Pain Intervention(s): Monitored during session, Patient requesting pain meds-RN notified    Home Living Family/patient expects to be discharged to:: Group home                   Additional Comments: Pt is a poor historian, chart review shows pt lives at [facility], may require assistance for ADL/IADL unable to contact group home, TOC notified .    Prior Function Prior Level of Function : Needs assist             Mobility Comments: Pt is poor historian & unable to contact anyone at [facility].       Hand Dominance        Extremity/Trunk Assessment   Upper Extremity Assessment Upper Extremity Assessment: Overall WFL for tasks assessed    Lower Extremity Assessment Lower Extremity Assessment: LLE deficits/detail LLE Deficits / Details: edema noted throughout LLE, L hinge break donned and locked in extension throughout    Cervical / Trunk Assessment Cervical / Trunk Assessment: Normal  Communication   Communication: Expressive difficulties  Cognition Arousal/Alertness: Awake/alert   Overall Cognitive Status: No family/caregiver present to determine baseline cognitive functioning                                 General Comments: Pt answers "yes" to all questions, but is able to state her name when asked. Pt reports she doesn't feel good, verbalizes her belly & LLE  hurt.        General Comments General comments (skin integrity, edema, etc.): spo2 >90% on RA, BP 139/62 in bed    Exercises     Assessment/Plan    PT Assessment Patient needs continued PT services  PT Problem List Decreased strength;Pain;Decreased range of motion;Decreased cognition;Decreased activity tolerance;Decreased balance;Decreased knowledge of precautions;Decreased mobility;Decreased safety awareness;Decreased knowledge of use of DME       PT Treatment Interventions DME instruction;Therapeutic exercise;Balance training;Gait training;Stair training;Functional mobility training;Therapeutic activities;Patient/family education;Neuromuscular re-education;Modalities;Manual techniques    PT Goals (Current goals can be found in the Care Plan section)  Acute Rehab PT Goals Patient Stated Goal: feel better PT Goal Formulation: With patient Time For Goal Achievement: 03/02/22 Potential to Achieve Goals: Fair    Frequency 7X/week     Co-evaluation PT/OT/SLP Co-Evaluation/Treatment: Yes Reason for Co-Treatment: For patient/therapist safety;Necessary to address cognition/behavior during functional activity;Complexity of the patient's impairments (multi-system involvement) PT goals addressed during session: Mobility/safety with mobility;Balance;Proper use of DME OT goals addressed during  session: ADL's and self-care       AM-PAC PT "6 Clicks" Mobility  Outcome Measure Help needed turning from your back to your side while in a flat bed without using bedrails?: A Little Help needed moving from lying on your back to sitting on the side of a flat bed without using bedrails?: A Little Help needed moving to and from a bed to a chair (including a wheelchair)?: A Little Help needed standing up from a chair using your arms (e.g., wheelchair or bedside chair)?: A Little Help needed to walk in hospital room?: Total Help needed climbing 3-5 steps with a railing? : Total 6 Click Score:  14    End of Session Equipment Utilized During Treatment:  (L hinged knee brace donned & locked in extension) Activity Tolerance: Patient tolerated treatment well (limited by not feeling well & inability to maintain NWB LLE) Patient left: in chair;with chair alarm set;with call bell/phone within reach Nurse Communication: Mobility status PT Visit Diagnosis: Unsteadiness on feet (R26.81);Muscle weakness (generalized) (M62.81);Difficulty in walking, not elsewhere classified (R26.2);Other abnormalities of gait and mobility (R26.89)    Time: 4076-8088 PT Time Calculation (min) (ACUTE ONLY): 24 min   Charges:   PT Evaluation $PT Eval Moderate Complexity: [place], PT, DPT 02/16/22, 11:23 AM   Waunita Schooner 02/16/2022, 11:19 AM

## 2022-02-16 NOTE — Assessment & Plan Note (Signed)
Hemoglobin 8.7>>8.1>>6.8>>9.0  Received 1 unit of PRBC -Continue to monitor - Continue iron supplement

## 2022-02-16 NOTE — TOC Progression Note (Signed)
Transition of Care [facility]) - Progression Note    Patient Details  Name: Carly Collins MRN: [id number] Date of Birth: 14-Dec-1955  [facility] CM/SW Contact  Zigmund Daniel Dorian Pod, RN Phone Number:[phone] 02/16/2022, 4:02 PM  Clinical Narrative:   Recommendation for HHPT however [facility] can not except HHPT agencies to their facility but has a PT consult available to services pt's however limited on visiting days. 24 hrs care can be provided and agent will confirm how much service can be provided to the pt via their PT services. They will hold the bed for 30 days if pt needs to go to a SNF. Agency indicated pt was with intermediate IDD with a behavior tech for pt's previous services. Currently awaiting agency to call with the specifics on available services for this pt.   TOC will follow up accordingly with group home concerning pt's discharge disposition.       Expected Discharge Plan and Services                                                 Social Determinants of Health (SDOH) Interventions Food Insecurity Interventions: Patient Refused Housing Interventions: Patient Refused Transportation Interventions: Patient Refused Utilities Interventions: Patient Refused  Readmission Risk Interventions     No data to display

## 2022-02-16 NOTE — Progress Notes (Signed)
Progress Note   Patient: Carly Collins EHU:[id number] DOB: 09-Dec-1955 DOA: 02/13/2022     2 DOS: the patient was seen and examined on 02/16/2022   Brief hospital course: Taken from H&P.  Carly Collins is a 66 y.o. female with medical history significant of cognitive impairment, hypertension, hyperlipidemia, diet-controlled diabetes, GERD, depression with anxiety, recurrent UTI, C. difficile colitis, iron deficiency anemia, breast cancer, who presents with fall and confusion.   Patient has history of cognitive impairment, cannot provide accurate medical history.  Unable to reach any family member so history was obtained from chart review.Per report, patient had positive COVID test 1 week ago. She has mild dry cough, does not seem to have shortness of breath and chest pain.  Today her COVID test is negative.  Patient was found to be more confused than her baseline in facility.  She fell twice in facility. Unclear if she hit her head or had loss of consciousness. Patient has dysuria and suprapubic abdominal pain.  Data reviewed independently and ED Course: pt was found to have positive urinalysis (hazy appearance, large amount of leukocyte, positive nitrite, rare bacteria, WBC> 50), negative COVID PCR, WBC 6.6, GFR> 60, temperature normal, blood pressure 110/88, heart rate 58, RR 19, oxygen saturation 98% on room air.  Chest x-ray negative.  CT head is negative.   10/5: Patient was complaining of left leg pain and unable to bear weight.  Her left leg seems swollen.  Imaging was ordered and found to have minimally displaced left lower femoral fracture. Orthopedic was consulted. Preliminary urine cultures with E. coli pending susceptibility. 10/6: Hemodynamically stable.  Orthopedic surgery is recommending surgery to stabilize her fracture.  Message sent to her nephew who is also her POA for concent. Patient will be going to the OR later today.  10/7: S/p ORIF yesterday.  Patient will be  nonweightbearing on left lower extremity.  Urine cultures with pansensitive E. Coli. Hemoglobin dropped to 6.8 from 8 postoperatively-ordered 1 unit of PRBC.  PT and OT are recommending SNF. TOC consult for placement.   Assessment and Plan: * UTI (urinary tract infection) Urine cultures with pansensitive E. coli Patient has recurrent UTI.  No sepsis. -Antibiotics switched to Keflex to complete a 5-day course.  Femoral distal fracture (HCC) S/p ORIF on 02/15/2022. Most likely secondary to fall.  Imaging with concern of minimally displaced left distal femoral fracture. Patient will be NWB on left lower extremity. -PT/OT are recommending SNF -Continue with pain management  Acute metabolic encephalopathy Improved.  Seems to be at baseline now Per report, patient is more confused than baseline.  Patient has history of cognitive impairment. Not sure about baseline mental status.  CT head negative.  Likely due to UTI   Hypertension Blood pressure within goal.  Pt is not taking meds now - IV hydralazine as needed  Breast cancer (HCC) -continue letrozole  Anxiety and depression - Continue home medications:  IDA (iron deficiency anemia) Hemoglobin 8.7>>8.1>>6.8 postoperatively. -Give her 1 unit of PRBC - Continue iron supplement  Intertriginous dermatitis associated with moisture - Topical nystatin powder  Cognitive deficits Continue donepezil, Namenda  COVID-19 virus infection Patient has mild dry cough, no fever.  Oxygen saturation 98% on room air.  Chest x-ray negative.  Patient was started on molnupiravir on 10/3 in facility.  Today COVID test is negative. -Will not continue molnupiravir -As needed albuterol and Mucinex   Subjective: Patient continued to have left leg pain, just finished working with PT when seen  today.  Appears little pale.  Physical Exam: Vitals:   02/16/22 0459 02/16/22 0910 02/16/22 1215 02/16/22 1304  BP: 137/71 (!) 149/71 131/66 125/72  Pulse:  72 71 88 88  Resp: '14 16 17 16  '$ Temp: 97.8 F (36.6 C) 97.6 F (36.4 C)  98.2 F (36.8 C)  TempSrc:    Oral  SpO2: 97% 94% 96% 100%  Weight:      Height:       General.  Ill-appearing lady, appears little pale, in no acute distress. Pulmonary.  Lungs clear bilaterally, normal respiratory effort. CV.  Regular rate and rhythm, no JVD, rub or murmur. Abdomen.  Soft, nontender, nondistended, BS positive. CNS.  Alert and oriented .  No focal neurologic deficit. Extremities.  No edema, no cyanosis, pulses intact and symmetrical.  LLE with brace and clean bandage Psychiatry.  Judgment and insight appears impaired.  Data Reviewed: Prior data reviewed  Family Communication: Unable to reach any family member on phone.  Disposition: Status is: Inpatient Remains inpatient appropriate because:    Planned Discharge Destination: SNF  DVT prophylaxis.  Lovenox Time spent: 50 minutes  This record has been created using Systems analyst. Errors have been sought and corrected,but may not always be located. Such creation errors do not reflect on the standard of care.  Author: Lorella Nimrod, MD 02/16/2022 1:33 PM  For on call review [email or web address].

## 2022-02-16 NOTE — Assessment & Plan Note (Signed)
-   Topical nystatin powder

## 2022-02-17 DIAGNOSIS — N3000 Acute cystitis without hematuria: Secondary | ICD-10-CM | POA: Diagnosis not present

## 2022-02-17 DIAGNOSIS — S72402A Unspecified fracture of lower end of left femur, initial encounter for closed fracture: Secondary | ICD-10-CM | POA: Diagnosis not present

## 2022-02-17 LAB — TYPE AND SCREEN
ABO/RH(D): O NEG
Antibody Screen: NEGATIVE
Unit division: 0

## 2022-02-17 LAB — CBC
HCT: 27.1 % — ABNORMAL LOW (ref 36.0–46.0)
Hemoglobin: 9 g/dL — ABNORMAL LOW (ref 12.0–15.0)
MCH: 29.8 pg (ref 26.0–34.0)
MCHC: 33.2 g/dL (ref 30.0–36.0)
MCV: 89.7 fL (ref 80.0–100.0)
Platelets: 239 10*3/uL (ref 150–400)
RBC: 3.02 MIL/uL — ABNORMAL LOW (ref 3.87–5.11)
RDW: 14.3 % (ref 11.5–15.5)
WBC: 7 10*3/uL (ref 4.0–10.5)
nRBC: 0 % (ref 0.0–0.2)

## 2022-02-17 LAB — GLUCOSE, CAPILLARY
Glucose-Capillary: 95 mg/dL (ref 70–99)
Glucose-Capillary: 182 mg/dL — ABNORMAL HIGH (ref 70–99)
Glucose-Capillary: 123 mg/dL — ABNORMAL HIGH (ref 70–99)

## 2022-02-17 LAB — BPAM RBC
Blood Product Expiration Date: 202311122359
ISSUE DATE / TIME: 202310071313
Unit Type and Rh: 5100

## 2022-02-17 MED ORDER — HYDROCODONE-ACETAMINOPHEN 7.5-325 MG PO TABS: 1.0000 | ORAL_TABLET | Freq: Four times a day (QID) | ORAL | 0 refills | Status: AC | PRN

## 2022-02-17 MED ORDER — ENOXAPARIN SODIUM 40 MG/0.4ML IJ SOSY
40.0000 mg | PREFILLED_SYRINGE | INTRAMUSCULAR | Status: DC
  Administered 2022-02-17 – 2022-02-21 (×5): 40 mg via SUBCUTANEOUS
  Filled 2022-02-17 (×6): qty 0.4

## 2022-02-17 NOTE — Progress Notes (Signed)
Physical Therapy Treatment Patient Details Name: Carly Collins MRN: [id number] DOB: [birth year] Today's Date: 02/17/2022   History of Present Illness Pt is a 66 y/o F admitted on 02/13/22 after presenting following fall & confusion. Pt  found to have positive urinalysis as well as minimally displaced L lower femoral fx.  Pt underwent L ORIF 02/15/22 by Dr. Roland Rack & is now NWB LLE. PMH: significant cognitive impairment, HTN, HLD, diet controlled diabetes, GERD, depression with anxiety, recurrent UTI, c diff colitis, iron deficiency anemia, breast CA    PT Comments    Pt to EOB with mod a x 1.  Lateral scoot to drop arm recliner with tech in to assist with mod a x 2.  Generally fair sitting balance.  Speech difficult to understand but seems to repeat "It hurts". Pt is almost due for pain meds and discussed with nurse.  Brace positioned well but further mobility deferred at this time due to discomfort.  Remains up for lunch an RN to give pain meds when able.   Recommendations for follow up therapy are one component of a multi-disciplinary discharge planning process, led by the attending physician.  Recommendations may be updated based on patient status, additional functional criteria and insurance authorization.  Follow Up Recommendations  Skilled nursing-short term rehab (<3 hours/day) (HHPT is preferred if staff with [facility] services can provide 24 hr assistance at d/c) Can patient physically be transported by private vehicle: No   Assistance Recommended at Discharge Frequent or constant Supervision/Assistance  Patient can return home with the following Two people to help with walking and/or transfers;A little help with bathing/dressing/bathroom;Help with stairs or ramp for entrance   Equipment Recommendations  Rolling walker (2 wheels);Wheelchair cushion (measurements PT);Wheelchair (measurements PT) (elevating leg rests)    Recommendations for Other Services OT consult     Precautions /  Restrictions Precautions Precautions: Fall Precaution Comments: LLE hinged knee brace locked in extension, can remove to perform gentle ROM with therapy Restrictions Weight Bearing Restrictions: Yes LLE Weight Bearing: Non weight bearing     Mobility  Bed Mobility Overal bed mobility: Needs Assistance Bed Mobility: Supine to Sit     Supine to sit: Mod assist          Transfers Overall transfer level: Needs assistance Equipment used: None   Sit to Stand: Mod assist, +2 physical assistance           General transfer comment: tech in to assist and teach lateral scoot transfers for return to bed.    Ambulation/Gait                   Stairs             Wheelchair Mobility    Modified Rankin (Stroke Patients Only)       Balance Overall balance assessment: Needs assistance   Sitting balance-Leahy Scale: Fair Sitting balance - Comments: supervision static sitting                                    Cognition Arousal/Alertness: Awake/alert Behavior During Therapy: WFL for tasks assessed/performed, Flat affect Overall Cognitive Status: No family/caregiver present to determine baseline cognitive functioning  Exercises      General Comments        Pertinent Vitals/Pain Pain Assessment Pain Assessment: Faces Faces Pain Scale: Hurts little more Pain Location: LLE  - seems to say "I hurts" but speech is mumbled and difficult to undertand Pain Descriptors / Indicators: Discomfort, Grimacing Pain Intervention(s): Monitored during session, Limited activity within patient's tolerance, Patient requesting pain meds-RN notified    Home Living                          Prior Function            PT Goals (current goals can now be found in the care plan section) Progress towards PT goals: Progressing toward goals    Frequency    7X/week      PT Plan       Co-evaluation              AM-PAC PT "6 Clicks" Mobility   Outcome Measure  Help needed turning from your back to your side while in a flat bed without using bedrails?: A Little Help needed moving from lying on your back to sitting on the side of a flat bed without using bedrails?: A Little Help needed moving to and from a bed to a chair (including a wheelchair)?: A Lot Help needed standing up from a chair using your arms (e.g., wheelchair or bedside chair)?: A Little Help needed to walk in hospital room?: Total Help needed climbing 3-5 steps with a railing? : Total 6 Click Score: 13    End of Session Equipment Utilized During Treatment:  (L hinged knee brace donned & locked in extension) Activity Tolerance: Patient tolerated treatment well (limited by not feeling well & inability to maintain NWB LLE) Patient left: in chair;with chair alarm set;with call bell/phone within reach Nurse Communication: Mobility status PT Visit Diagnosis: Unsteadiness on feet (R26.81);Muscle weakness (generalized) (M62.81);Difficulty in walking, not elsewhere classified (R26.2);Other abnormalities of gait and mobility (R26.89)     Time: 9407-6808 PT Time Calculation (min) (ACUTE ONLY): 10 min  Charges:  $Therapeutic Activity: 8-22 mins                   Chesley Noon, PTA 02/17/22, 11:53 AM

## 2022-02-17 NOTE — Progress Notes (Signed)
   Subjective: 2 Days Post-Op Procedure(s) (LRB): OPEN REDUCTION INTERNAL FIXATION (ORIF) DISTAL FEMUR FRACTURE (Left) Patient reports pain as mild.  Patient is well, and has had no acute complaints or problems Denies any CP, SOB, ABD pain. We will continue therapy today.   Objective: Vital signs in last 24 hours: Temp:  [97.3 F (36.3 C)-98.4 F (36.9 C)] 97.3 F (36.3 C) (10/08 0816) Pulse Rate:  [67-88] 69 (10/08 0816) Resp:  [16-20] 16 (10/08 0816) BP: (123-144)/(60-75) 134/75 (10/08 0816) SpO2:  [91 %-100 %] 93 % (10/08 0816)  Intake/Output from previous day: 10/07 0701 - 10/08 0700 In: 1498.2 [P.O.:1080; I.V.:52.2; Blood:366] Out: 1200 [Urine:1200] Intake/Output this shift: No intake/output data recorded.  Recent Labs    02/14/22 1416 02/15/22 0823 02/16/22 1016 02/16/22 1640  HGB 7.9* 8.1* 6.8* 7.5*   Recent Labs    02/15/22 0823 02/16/22 1016 02/16/22 1640  WBC 6.4 8.5  --   RBC 2.67*  2.62* 2.25*  --   HCT 24.8* 20.7* 22.3*  PLT 148* 158  --    No results for input(s): "NA", "K", "CL", "CO2", "BUN", "CREATININE", "GLUCOSE", "CALCIUM" in the last 72 hours.  No results for input(s): "LABPT", "INR" in the last 72 hours.  EXAM General - Patient is Alert and Appropriate Extremity - Neurovascular intact Sensation intact distally Intact pulses distally Dorsiflexion/Plantar flexion intact No cellulitis present Compartment soft Knee immobilizer intact Dressing - dressing C/D/I and no drainage Motor Function - intact, moving foot and toes well on exam.   Past Medical History:  Diagnosis Date   Anxiety    C. difficile enteritis    Cancer ([place])    Depression    Diabetes mellitus without complication ([place])    GERD (gastroesophageal reflux disease)    Headache    Hyperlipidemia    Hypertension    Mental retardation, mild (I.Q. 50-70)    Moderate intellectual disabilities    Obesity    Recurrent UTI    Tibial fracture    Vitamin D deficiency      Assessment/Plan:   2 Days Post-Op Procedure(s) (LRB): OPEN REDUCTION INTERNAL FIXATION (ORIF) DISTAL FEMUR FRACTURE (Left) Principal Problem:   UTI (urinary tract infection) Active Problems:   Anxiety and depression   Breast cancer (HCC)   Cognitive deficits   IDA (iron deficiency anemia)   Acute metabolic encephalopathy   Hypertension   Intertriginous dermatitis associated with moisture   COVID-19 virus infection   Femoral distal fracture (HCC)  Estimated body mass index is 24.6 kg/m as calculated from the following:   Height as of this encounter: '5\' 2"'$  (1.575 m).   Weight as of this encounter: 61 kg. Advance diet Up with therapy, NWB LLE Pain controlled VSS Acute post op blood loss anemia with underlying chronic anemia - Hgb pending this am. S/p 1 unit of PRBC yesterday. Lovenox dcd CM to assist with discharge to SNF  Follow up with [facility] [facility] ortho in 2 weeks  DVT Prophylaxis - TED hose and SCDs Non-Weight bearing Left lower extremity   T. Rachelle Hora, PA-C [place] 02/17/2022, 10:27 AM

## 2022-02-17 NOTE — Progress Notes (Signed)
Progress Note   Patient: Carly Collins UXL:[id number] DOB: 11-Nov-1955 DOA: 02/13/2022     3 DOS: the patient was seen and examined on 02/17/2022   Brief hospital course: Taken from H&P.  Carly Collins is a 66 y.o. female with medical history significant of cognitive impairment, hypertension, hyperlipidemia, diet-controlled diabetes, GERD, depression with anxiety, recurrent UTI, C. difficile colitis, iron deficiency anemia, breast cancer, who presents with fall and confusion.   Patient has history of cognitive impairment, cannot provide accurate medical history.  Unable to reach any family member so history was obtained from chart review.Per report, patient had positive COVID test 1 week ago. She has mild dry cough, does not seem to have shortness of breath and chest pain.  Today her COVID test is negative.  Patient was found to be more confused than her baseline in facility.  She fell twice in facility. Unclear if she hit her head or had loss of consciousness. Patient has dysuria and suprapubic abdominal pain.  Data reviewed independently and ED Course: pt was found to have positive urinalysis (hazy appearance, large amount of leukocyte, positive nitrite, rare bacteria, WBC> 50), negative COVID PCR, WBC 6.6, GFR> 60, temperature normal, blood pressure 110/88, heart rate 58, RR 19, oxygen saturation 98% on room air.  Chest x-ray negative.  CT head is negative.   10/5: Patient was complaining of left leg pain and unable to bear weight.  Her left leg seems swollen.  Imaging was ordered and found to have minimally displaced left lower femoral fracture. Orthopedic was consulted. Preliminary urine cultures with E. coli pending susceptibility. 10/6: Hemodynamically stable.  Orthopedic surgery is recommending surgery to stabilize her fracture.  Message sent to her nephew who is also her POA for concent. Patient will be going to the OR later today.  10/7: S/p ORIF yesterday.  Patient will be  nonweightbearing on left lower extremity.  Urine cultures with pansensitive E. Coli. Hemoglobin dropped to 6.8 from 8 postoperatively-ordered 1 unit of PRBC.  PT and OT are recommending SNF. TOC consult for placement.  10/8: Patient remained stable.  Hemoglobin improved to 9.  Restarting Lovenox as she is high risk for DVT. Patient will remain nonweightbearing on the left lower extremity per orthopedic surgery.   Assessment and Plan: * UTI (urinary tract infection) Urine cultures with pansensitive E. coli Patient has recurrent UTI.  No sepsis. -Antibiotics switched to Keflex to complete a 5-day course.  Femoral distal fracture (HCC) S/p ORIF on 02/15/2022. Most likely secondary to fall.  Imaging with concern of minimally displaced left distal femoral fracture. Patient will be NWB on left lower extremity. -PT/OT are recommending SNF -Continue with pain management  Acute metabolic encephalopathy Improved.  Seems to be at baseline now Per report, patient is more confused than baseline.  Patient has history of cognitive impairment. Not sure about baseline mental status.  CT head negative.  Likely due to UTI   Hypertension Blood pressure within goal.  Pt is not taking meds now - IV hydralazine as needed  Breast cancer (HCC) -continue letrozole  Anxiety and depression - Continue home medications:  IDA (iron deficiency anemia) Hemoglobin 8.7>>8.1>>6.8>>9.0  Received 1 unit of PRBC -Continue to monitor - Continue iron supplement  Intertriginous dermatitis associated with moisture - Topical nystatin powder  Cognitive deficits Continue donepezil, Namenda  COVID-19 virus infection Patient has mild dry cough, no fever.  Oxygen saturation 98% on room air.  Chest x-ray negative.  Patient was started on molnupiravir on 10/3  in facility.  Today COVID test is negative. -Will not continue molnupiravir -As needed albuterol and Mucinex   Subjective: Patient was seen and examined  today.  No new complaint.  She was getting ready to work with PT.  Physical Exam: Vitals:   02/16/22 1629 02/16/22 1647 02/17/22 0022 02/17/22 0816  BP: 137/69 123/75 (!) 144/67 134/75  Pulse: 70 71 67 69  Resp: '16 16 20 16  '$ Temp: 97.7 F (36.5 C) 98.4 F (36.9 C) 98.2 F (36.8 C) (!) 97.3 F (36.3 C)  TempSrc: Oral   Oral  SpO2: 96% 99% 91% 93%  Weight:      Height:       General.  Cognitively impaired lady, in no acute distress. Pulmonary.  Lungs clear bilaterally, normal respiratory effort. CV.  Regular rate and rhythm, no JVD, rub or murmur. Abdomen.  Soft, nontender, nondistended, BS positive. CNS.  Alert and oriented .  No focal neurologic deficit. Extremities.  No edema, no cyanosis, pulses intact and symmetrical. Psychiatry.  Judgment and insight appears impaired.  Data Reviewed: Prior data reviewed  Family Communication:   Disposition: Status is: Inpatient Remains inpatient appropriate because: Severity of illness   Planned Discharge Destination: Skilled nursing facility  DVT prophylaxis.  Lovenox Time spent: 40 minutes  This record has been created using Systems analyst. Errors have been sought and corrected,but may not always be located. Such creation errors do not reflect on the standard of care.  Author: Lorella Nimrod, MD 02/17/2022 2:40 PM  For on call review [email or web address].

## 2022-02-17 NOTE — Plan of Care (Signed)
  Problem: Health Behavior/Discharge Planning: Goal: Ability to manage health-related needs will improve Outcome: Progressing   Problem: Clinical Measurements: Goal: Will remain free from infection Outcome: Progressing   Problem: Clinical Measurements: Goal: Diagnostic test results will improve Outcome: Progressing   Problem: Clinical Measurements: Goal: Ability to maintain clinical measurements within normal limits will improve Outcome: Progressing   Problem: Nutrition: Goal: Adequate nutrition will be maintained Outcome: Progressing   Problem: Coping: Goal: Level of anxiety will decrease Outcome: Progressing    Problem: Pain Managment: Goal: General experience of comfort will improve Outcome: Progressing

## 2022-02-18 ENCOUNTER — Encounter: Payer: Self-pay | Admitting: Surgery

## 2022-02-18 DIAGNOSIS — N3000 Acute cystitis without hematuria: Secondary | ICD-10-CM | POA: Diagnosis not present

## 2022-02-18 DIAGNOSIS — S72402A Unspecified fracture of lower end of left femur, initial encounter for closed fracture: Secondary | ICD-10-CM | POA: Diagnosis not present

## 2022-02-18 LAB — CBC
HCT: 25 % — ABNORMAL LOW (ref 36.0–46.0)
Hemoglobin: 8.4 g/dL — ABNORMAL LOW (ref 12.0–15.0)
MCH: 30.2 pg (ref 26.0–34.0)
MCHC: 33.6 g/dL (ref 30.0–36.0)
MCV: 89.9 fL (ref 80.0–100.0)
Platelets: 245 10*3/uL (ref 150–400)
RBC: 2.78 MIL/uL — ABNORMAL LOW (ref 3.87–5.11)
RDW: 14 % (ref 11.5–15.5)
WBC: 7.7 10*3/uL (ref 4.0–10.5)
nRBC: 0 % (ref 0.0–0.2)

## 2022-02-18 LAB — GLUCOSE, CAPILLARY: Glucose-Capillary: 98 mg/dL (ref 70–99)

## 2022-02-18 MED ORDER — FERROUS GLUCONATE 324 (38 FE) MG PO TABS
324.0000 mg | ORAL_TABLET | Freq: Two times a day (BID) | ORAL | Status: DC
  Administered 2022-02-18 – 2022-02-22 (×8): 324 mg via ORAL
  Filled 2022-02-18 (×8): qty 1

## 2022-02-18 NOTE — Assessment & Plan Note (Signed)
Urine cultures with pansensitive E. coli Patient has recurrent UTI.  No sepsis. Completed a course of antibiotics.

## 2022-02-18 NOTE — Progress Notes (Signed)
   Subjective: 3 Days Post-Op Procedure(s) (LRB): OPEN REDUCTION INTERNAL FIXATION (ORIF) DISTAL FEMUR FRACTURE (Left) Patient reports pain as mild.  Patient is well, and has had no acute complaints or problems Denies any CP, SOB, ABD pain. We will continue therapy today.   Objective: Vital signs in last 24 hours: Temp:  [98 F (36.7 C)-99 F (37.2 C)] 98.9 F (37.2 C) (10/09 0813) Pulse Rate:  [70-77] 70 (10/09 0813) Resp:  [20] 20 (10/08 2315) BP: (123-150)/(72-77) 129/72 (10/09 0813) SpO2:  [89 %-93 %] 89 % (10/09 0813)  Intake/Output from previous day: 10/08 0701 - 10/09 0700 In: 240 [P.O.:240] Out: 900 [Urine:900] Intake/Output this shift: Total I/O In: 240 [P.O.:240] Out: -   Recent Labs    02/16/22 1016 02/16/22 1640 02/17/22 0958  HGB 6.8* 7.5* 9.0*   Recent Labs    02/16/22 1016 02/16/22 1640 02/17/22 0958  WBC 8.5  --  7.0  RBC 2.25*  --  3.02*  HCT 20.7* 22.3* 27.1*  PLT 158  --  239   No results for input(s): "NA", "K", "CL", "CO2", "BUN", "CREATININE", "GLUCOSE", "CALCIUM" in the last 72 hours.  No results for input(s): "LABPT", "INR" in the last 72 hours.  EXAM General - Patient is Alert and Appropriate Extremity - Neurovascular intact Sensation intact distally Intact pulses distally Dorsiflexion/Plantar flexion intact No cellulitis present Compartment soft Knee immobilizer intact Dressing - dressing C/D/I and no drainage Motor Function - intact, moving foot and toes well on exam.   Past Medical History:  Diagnosis Date   Anxiety    C. difficile enteritis    Cancer ([place])    Depression    Diabetes mellitus without complication ([place])    GERD (gastroesophageal reflux disease)    Headache    Hyperlipidemia    Hypertension    Mental retardation, mild (I.Q. 50-70)    Moderate intellectual disabilities    Obesity    Recurrent UTI    Tibial fracture    Vitamin D deficiency     Assessment/Plan:   3 Days Post-Op Procedure(s)  (LRB): OPEN REDUCTION INTERNAL FIXATION (ORIF) DISTAL FEMUR FRACTURE (Left) Principal Problem:   UTI (urinary tract infection) Active Problems:   Anxiety and depression   Breast cancer (HCC)   Cognitive deficits   IDA (iron deficiency anemia)   Acute metabolic encephalopathy   Hypertension   Intertriginous dermatitis associated with moisture   COVID-19 virus infection   Femoral distal fracture (HCC)  Estimated body mass index is 24.6 kg/m as calculated from the following:   Height as of this encounter: '5\' 2"'$  (1.575 m).   Weight as of this encounter: 61 kg. Advance diet Up with therapy, NWB LLE Pain controlled VSS Acute post op blood loss anemia with underlying chronic anemia - Hgb 9.0 yesterday, stable. S/p 1 unit of PRBC 02/16/22. Lovenox dcd CM to assist with discharge to SNF  Follow up with [facility] ortho in 2 weeks NWB LLE  DVT Prophylaxis - TED hose and SCDs Non-Weight bearing Left lower extremity   T. Rachelle Hora, PA-C [place] 02/18/2022, 10:26 AM

## 2022-02-18 NOTE — Plan of Care (Signed)
  Problem: Education: Goal: Knowledge of General Education information will improve Description: Including pain rating scale, medication(s)/side effects and non-pharmacologic comfort measures Outcome: Progressing   Problem: Health Behavior/Discharge Planning: Goal: Ability to manage health-related needs will improve Outcome: Progressing   Problem: Clinical Measurements: Goal: Respiratory complications will improve Outcome: Progressing   Problem: Clinical Measurements: Goal: Cardiovascular complication will be avoided Outcome: Progressing   Problem: Coping: Goal: Level of anxiety will decrease Outcome: Progressing   Problem: Pain Managment: Goal: General experience of comfort will improve Outcome: Progressing

## 2022-02-18 NOTE — Assessment & Plan Note (Signed)
S/p ORIF on 02/15/2022. Most likely secondary to fall.  Imaging with concern of minimally displaced left distal femoral fracture. Patient will be NWB on left lower extremity. -PT/OT are recommending SNF -Continue with pain management -Outpatient orthopedic follow-up in 2 weeks

## 2022-02-18 NOTE — Assessment & Plan Note (Signed)
Hemoglobin 8.7>>8.1>>6.8>>9.0>>8.4  Received 1 unit of PRBC -Continue to monitor - Continue iron supplement-dose increased to twice daily

## 2022-02-18 NOTE — Progress Notes (Signed)
Physical Therapy Treatment Patient Details Name: Carly Collins MRN: [id number] DOB: 1955/05/17 Today's Date: 02/18/2022   History of Present Illness Pt is a 66 y/o F admitted on 02/13/22 after presenting following fall & confusion. Pt  found to have positive urinalysis as well as minimally displaced L lower femoral fx.  Pt underwent L ORIF 02/15/22 by Dr. Roland Rack & is now NWB LLE. PMH: significant cognitive impairment, HTN, HLD, diet controlled diabetes, GERD, depression with anxiety, recurrent UTI, c diff colitis, iron deficiency anemia, breast CA    PT Comments    Pt in bed, inc urine in need of a bath.  Assisted with bed bath in supine and sitting.  Min/mod a x 1 to transition to EOB and remains sitting with supervision for tasks.  +2 arrived for standing with min a x 2 for pericare in standing and then transferred to recliner at bedside.  NWB L in standing and transfer.   Recommendations for follow up therapy are one component of a multi-disciplinary discharge planning process, led by the attending physician.  Recommendations may be updated based on patient status, additional functional criteria and insurance authorization.  Follow Up Recommendations  Skilled nursing-short term rehab (<3 hours/day) (HHPT is preferred if staff with [facility] services can provide 24 hr assistance at d/c) Can patient physically be transported by private vehicle: No   Assistance Recommended at Discharge Frequent or constant Supervision/Assistance  Patient can return home with the following Two people to help with walking and/or transfers;A little help with bathing/dressing/bathroom;Help with stairs or ramp for entrance   Equipment Recommendations  Rolling walker (2 wheels);Wheelchair cushion (measurements PT);Wheelchair (measurements PT) (elevating leg rests)    Recommendations for Other Services OT consult     Precautions / Restrictions Precautions Precautions: Fall Precaution Comments: LLE hinged knee  brace locked in extension, can remove to perform gentle ROM with therapy Restrictions Weight Bearing Restrictions: Yes LLE Weight Bearing: Non weight bearing     Mobility  Bed Mobility Overal bed mobility: Needs Assistance Bed Mobility: Supine to Sit     Supine to sit: Mod assist          Transfers Overall transfer level: Needs assistance Equipment used: None   Sit to Stand: +2 physical assistance, Min assist   Step pivot transfers: Min assist, +2 safety/equipment, +2 physical assistance       General transfer comment: does well with transfer to chair with RW today    Ambulation/Gait               General Gait Details: no true gait but pivots on RLE   Stairs             Wheelchair Mobility    Modified Rankin (Stroke Patients Only)       Balance   Sitting-balance support: Feet supported, Bilateral upper extremity supported Sitting balance-Leahy Scale: Fair     Standing balance support: Bilateral upper extremity supported, During functional activity Standing balance-Leahy Scale: Poor                              Cognition Arousal/Alertness: Awake/alert Behavior During Therapy: WFL for tasks assessed/performed, Flat affect Overall Cognitive Status: No family/caregiver present to determine baseline cognitive functioning  Exercises      General Comments        Pertinent Vitals/Pain Pain Assessment Pain Assessment: Faces Faces Pain Scale: Hurts even more Pain Location: LLE  - seems to say "I hurts" but speech is mumbled and difficult to undertand Pain Descriptors / Indicators: Discomfort, Grimacing Pain Intervention(s): Limited activity within patient's tolerance, Monitored during session, Repositioned    Home Living                          Prior Function            PT Goals (current goals can now be found in the care plan section) Progress towards  PT goals: Progressing toward goals    Frequency    7X/week      PT Plan      Co-evaluation              AM-PAC PT "6 Clicks" Mobility   Outcome Measure  Help needed turning from your back to your side while in a flat bed without using bedrails?: A Little Help needed moving from lying on your back to sitting on the side of a flat bed without using bedrails?: A Little Help needed moving to and from a bed to a chair (including a wheelchair)?: A Lot Help needed standing up from a chair using your arms (e.g., wheelchair or bedside chair)?: A Little Help needed to walk in hospital room?: Total Help needed climbing 3-5 steps with a railing? : Total 6 Click Score: 13    End of Session Equipment Utilized During Treatment:  (L hinged knee brace donned & locked in extension) Activity Tolerance: Patient tolerated treatment well (limited by not feeling well & inability to maintain NWB LLE) Patient left: in chair;with chair alarm set;with call bell/phone within reach Nurse Communication: Mobility status PT Visit Diagnosis: Unsteadiness on feet (R26.81);Muscle weakness (generalized) (M62.81);Difficulty in walking, not elsewhere classified (R26.2);Other abnormalities of gait and mobility (R26.89)     Time: 0347-4259 PT Time Calculation (min) (ACUTE ONLY): 15 min  Charges:  $Therapeutic Activity: 8-22 mins                   Chesley Noon, PTA 02/18/22, 12:39 PM

## 2022-02-18 NOTE — Care Management Important Message (Signed)
Important Message  Patient Details  Name: Carly Collins MRN: [id number] Date of Birth: [birth year]   Medicare Important Message Given:  N/A - LOS <3 / Initial given by admissions     Juliann Pulse A Sherell Christoffel 02/18/2022, 1:49 PM

## 2022-02-18 NOTE — Progress Notes (Signed)
Progress Note   Patient: Carly Collins ZHG:[id number] DOB: 1955-09-04 DOA: 02/13/2022     4 DOS: the patient was seen and examined on 02/18/2022   Brief hospital course: Taken from H&P.  SHALECE STAFFA is a 66 y.o. female with medical history significant of cognitive impairment, hypertension, hyperlipidemia, diet-controlled diabetes, GERD, depression with anxiety, recurrent UTI, C. difficile colitis, iron deficiency anemia, breast cancer, who presents with fall and confusion.   Patient has history of cognitive impairment, cannot provide accurate medical history.  Unable to reach any family member so history was obtained from chart review.Per report, patient had positive COVID test 1 week ago. She has mild dry cough, does not seem to have shortness of breath and chest pain.  Today her COVID test is negative.  Patient was found to be more confused than her baseline in facility.  She fell twice in facility. Unclear if she hit her head or had loss of consciousness. Patient has dysuria and suprapubic abdominal pain.  Data reviewed independently and ED Course: pt was found to have positive urinalysis (hazy appearance, large amount of leukocyte, positive nitrite, rare bacteria, WBC> 50), negative COVID PCR, WBC 6.6, GFR> 60, temperature normal, blood pressure 110/88, heart rate 58, RR 19, oxygen saturation 98% on room air.  Chest x-ray negative.  CT head is negative.   10/5: Patient was complaining of left leg pain and unable to bear weight.  Her left leg seems swollen.  Imaging was ordered and found to have minimally displaced left lower femoral fracture. Orthopedic was consulted. Preliminary urine cultures with E. coli pending susceptibility. 10/6: Hemodynamically stable.  Orthopedic surgery is recommending surgery to stabilize her fracture.  Message sent to her nephew who is also her POA for concent. Patient will be going to the OR later today.  10/7: S/p ORIF yesterday.  Patient will be  nonweightbearing on left lower extremity.  Urine cultures with pansensitive E. Coli. Hemoglobin dropped to 6.8 from 8 postoperatively-ordered 1 unit of PRBC.  PT and OT are recommending SNF. TOC consult for placement.  10/8: Patient remained stable.  Hemoglobin improved to 9.  Restarting Lovenox as she is high risk for DVT. Patient will remain nonweightbearing on the left lower extremity per orthopedic surgery.  10/9: Hemodynamically stable, appears pale so CBC was rechecked and hemoglobin at 8.4.  Increasing the dose of ferrous sulfate to twice daily.  PT and OT are recommending SNF.  Patient will remain nonweightbearing on left lower extremity and will follow-up with orthopedic surgery as an outpatient in 2 weeks.   Assessment and Plan: * UTI (urinary tract infection) Urine cultures with pansensitive E. coli Patient has recurrent UTI.  No sepsis. Completed a course of antibiotics.  Femoral distal fracture (HCC) S/p ORIF on 02/15/2022. Most likely secondary to fall.  Imaging with concern of minimally displaced left distal femoral fracture. Patient will be NWB on left lower extremity. -PT/OT are recommending SNF -Continue with pain management -Outpatient orthopedic follow-up in 2 weeks  Acute metabolic encephalopathy Improved.  Seems to be at baseline now Per report, patient is more confused than baseline.  Patient has history of cognitive impairment. Not sure about baseline mental status.  CT head negative.  Likely due to UTI   Hypertension Blood pressure within goal.  Pt is not taking meds now - IV hydralazine as needed  Breast cancer (HCC) -continue letrozole  Anxiety and depression - Continue home medications:  IDA (iron deficiency anemia) Hemoglobin 8.7>>8.1>>6.8>>9.0>>8.4  Received 1 unit of  PRBC -Continue to monitor - Continue iron supplement-dose increased to twice daily  Intertriginous dermatitis associated with moisture - Topical nystatin powder  Cognitive  deficits Continue donepezil, Namenda  COVID-19 virus infection Patient has mild dry cough, no fever.  Oxygen saturation 98% on room air.  Chest x-ray negative.  Patient was started on molnupiravir on 10/3 in facility.  Today COVID test is negative. -Will not continue molnupiravir -As needed albuterol and Mucinex   Subjective: Patient was seen and examined today.  No acute concern.  She appears paler than normal.  No obvious bleeding.  Physical Exam: Vitals:   02/17/22 0816 02/17/22 1633 02/17/22 2315 02/18/22 0813  BP: 134/75 123/77 (!) 150/76 129/72  Pulse: 69 77 76 70  Resp: 16  20   Temp: (!) 97.3 F (36.3 C) 98 F (36.7 C) 99 F (37.2 C) 98.9 F (37.2 C)  TempSrc: Oral     SpO2: 93% 90% 93% (!) 89%  Weight:      Height:       General.  Cognitively impaired lady, in no acute distress. Pulmonary.  Lungs clear bilaterally, normal respiratory effort. CV.  Regular rate and rhythm, no JVD, rub or murmur. Abdomen.  Soft, nontender, nondistended, BS positive. CNS.  Alert and oriented .  No focal neurologic deficit. Extremities.  No edema, no cyanosis, pulses intact and symmetrical. Psychiatry.  Judgment and insight appears impaired.  Data Reviewed: Prior data reviewed  Family Communication: Still unable to reach brother on phone.  Talk with mother on phone who herself is 61 years old.  She was very upset with [place] that she fell in the hospital and broke her leg.  I tried explaining multiple times that she fell at the group home but she was keeps repeating that group home called her and told her that she fell in the hospital not at group home??  Disposition: Status is: Inpatient Remains inpatient appropriate because: Severity of illness   Planned Discharge Destination: Skilled nursing facility  DVT prophylaxis. Lovenox Time spent: 42 minutes  This record has been created using Systems analyst. Errors have been sought and corrected,but may not always be  located. Such creation errors do not reflect on the standard of care.  Author: Lorella Nimrod, MD 02/18/2022 3:36 PM  For on call review [email or web address].

## 2022-02-19 DIAGNOSIS — S72402A Unspecified fracture of lower end of left femur, initial encounter for closed fracture: Secondary | ICD-10-CM | POA: Diagnosis not present

## 2022-02-19 DIAGNOSIS — N3000 Acute cystitis without hematuria: Secondary | ICD-10-CM | POA: Diagnosis not present

## 2022-02-19 LAB — GLUCOSE, CAPILLARY: Glucose-Capillary: 101 mg/dL — ABNORMAL HIGH (ref 70–99)

## 2022-02-19 LAB — CBC
HCT: 25.1 % — ABNORMAL LOW (ref 36.0–46.0)
Hemoglobin: 8.3 g/dL — ABNORMAL LOW (ref 12.0–15.0)
MCH: 30 pg (ref 26.0–34.0)
MCHC: 33.1 g/dL (ref 30.0–36.0)
MCV: 90.6 fL (ref 80.0–100.0)
Platelets: 288 10*3/uL (ref 150–400)
RBC: 2.77 MIL/uL — ABNORMAL LOW (ref 3.87–5.11)
RDW: 13.8 % (ref 11.5–15.5)
WBC: 9 10*3/uL (ref 4.0–10.5)
nRBC: 0 % (ref 0.0–0.2)

## 2022-02-19 NOTE — Progress Notes (Signed)
Progress Note   Patient: Carly Collins TKW:[id number] DOB: Feb 27, 1956 DOA: 02/13/2022     5 DOS: the patient was seen and examined on 02/19/2022   Brief hospital course: Taken from H&P.  Carly Collins is a 66 y.o. female with medical history significant of cognitive impairment, hypertension, hyperlipidemia, diet-controlled diabetes, GERD, depression with anxiety, recurrent UTI, C. difficile colitis, iron deficiency anemia, breast cancer, who presents with fall and confusion.   Patient has history of cognitive impairment, cannot provide accurate medical history.  Unable to reach any family member so history was obtained from chart review.Per report, patient had positive COVID test 1 week ago. She has mild dry cough, does not seem to have shortness of breath and chest pain.  Today her COVID test is negative.  Patient was found to be more confused than her baseline in facility.  She fell twice in facility. Unclear if she hit her head or had loss of consciousness. Patient has dysuria and suprapubic abdominal pain.  Data reviewed independently and ED Course: pt was found to have positive urinalysis (hazy appearance, large amount of leukocyte, positive nitrite, rare bacteria, WBC> 50), negative COVID PCR, WBC 6.6, GFR> 60, temperature normal, blood pressure 110/88, heart rate 58, RR 19, oxygen saturation 98% on room air.  Chest x-ray negative.  CT head is negative.   10/5: Patient was complaining of left leg pain and unable to bear weight.  Her left leg seems swollen.  Imaging was ordered and found to have minimally displaced left lower femoral fracture. Orthopedic was consulted. Preliminary urine cultures with E. coli pending susceptibility. 10/6: Hemodynamically stable.  Orthopedic surgery is recommending surgery to stabilize her fracture.  Message sent to her nephew who is also her POA for concent. Patient will be going to the OR later today.  10/7: S/p ORIF yesterday.  Patient will be  nonweightbearing on left lower extremity.  Urine cultures with pansensitive E. Coli. Hemoglobin dropped to 6.8 from 8 postoperatively-ordered 1 unit of PRBC.  PT and OT are recommending SNF. TOC consult for placement.  10/8: Patient remained stable.  Hemoglobin improved to 9.  Restarting Lovenox as she is high risk for DVT. Patient will remain nonweightbearing on the left lower extremity per orthopedic surgery.  10/9: Hemodynamically stable, appears pale so CBC was rechecked and hemoglobin at 8.4.  Increasing the dose of ferrous sulfate to twice daily.  PT and OT are recommending SNF.  Patient will remain nonweightbearing on left lower extremity and will follow-up with orthopedic surgery as an outpatient in 2 weeks. Talk with mother on phone who herself is 33 years old.  She was very upset with [place] that she fell in the hospital and broke her leg.  I tried explaining multiple times that she fell at the group home but she was keeps repeating that group home called her and told her that she fell in the hospital not at group home?? Patient was admitted from group home with a complaint of fall and altered mental status.  10/10: Patient remained stable.  Working with PT.  Carly Collins is working on SNF placement.  Hemoglobin seems stable around 8.3.   Assessment and Plan: * UTI (urinary tract infection) Urine cultures with pansensitive E. coli Patient has recurrent UTI.  No sepsis. Completed a course of antibiotics.  Femoral distal fracture (HCC) S/p ORIF on 02/15/2022. Most likely secondary to fall.  Imaging with concern of minimally displaced left distal femoral fracture. Patient will be NWB on left lower extremity. -  PT/OT are recommending SNF -Continue with pain management -Outpatient orthopedic follow-up in 2 weeks  Acute metabolic encephalopathy Improved.  Seems to be at baseline now Per report, patient is more confused than baseline.  Patient has history of cognitive impairment. Not sure about  baseline mental status.  CT head negative.  Likely due to UTI   Hypertension Blood pressure within goal.  Pt is not taking meds now - IV hydralazine as needed  Breast cancer (HCC) -continue letrozole  Anxiety and depression - Continue home medications:  IDA (iron deficiency anemia) Hemoglobin 8.7>>8.1>>6.8>>9.0>>8.4>>8.3  Received 1 unit of PRBC -Continue to monitor - Continue iron supplement-dose increased to twice daily  Intertriginous dermatitis associated with moisture - Topical nystatin powder  Cognitive deficits Continue donepezil, Namenda  COVID-19 virus infection Patient has mild dry cough, no fever.  Oxygen saturation 98% on room air.  Chest x-ray negative.  Patient was started on molnupiravir on 10/3 in facility.  Today COVID test is negative. -Will not continue molnupiravir -As needed albuterol and Mucinex   Subjective: Patient was seen and examined today.  Sitting in chair.  No new concern.  Physical Exam: Vitals:   02/19/22 0007 02/19/22 0519 02/19/22 0854 02/19/22 1457  BP: 128/65 (!) 127/52 (!) 148/78 (!) 147/70  Pulse: 81 72 77 79  Resp: '16 16 20 17  '$ Temp: 98.3 F (36.8 C) (!) 97.5 F (36.4 C) 98.1 F (36.7 C) 97.7 F (36.5 C)  TempSrc:    Oral  SpO2: 92% 95% 98% 99%  Weight:      Height:       General.  Well-developed, cognitively impaired lady, in no acute distress. Pulmonary.  Lungs clear bilaterally, normal respiratory effort. CV.  Regular rate and rhythm, no JVD, rub or murmur. Abdomen.  Soft, nontender, nondistended, BS positive. CNS.  Alert and oriented .  No focal neurologic deficit. Extremities.  No edema, no cyanosis, pulses intact and symmetrical. Psychiatry.  Judgment and insight appears impaired.  Data Reviewed: Prior data reviewed  Family Communication: TOC updated the brother.  Disposition: Status is: Inpatient Remains inpatient appropriate because: Severity of illness   Planned Discharge Destination: Skilled nursing  facility  DVT prophylaxis. Lovenox Time spent: 40 minutes  This record has been created using Systems analyst. Errors have been sought and corrected,but may not always be located. Such creation errors do not reflect on the standard of care.  Author: Lorella Nimrod, MD 02/19/2022 3:45 PM  For on call review [email or web address].

## 2022-02-19 NOTE — NC FL2 (Signed)
[place] LEVEL OF CARE SCREENING TOOL     IDENTIFICATION  Patient Name: Carly Collins Birthdate: 1955-06-16 Sex: female Admission Date (Current Location): 02/13/2022  [facility] and [place] Number:  Engineering geologist and Address:  [facility], [address], [place], [place] [address]      Provider Number: [id number]  Attending Physician Name and Address:  Lorella Nimrod, MD  Relative Name and Phone Number:       Current Level of Care: Hospital Recommended Level of Care: [place] Prior Approval Number:    Date Approved/Denied:   PASRR Number: [id number]  Discharge Plan: SNF    Current Diagnoses: Patient Active Problem List   Diagnosis Date Noted   Femoral distal fracture ([place]) 02/14/2022   UTI (urinary tract infection) 27/78/2423   Acute metabolic encephalopathy 53/61/4431   Intertriginous dermatitis associated with moisture 02/13/2022   COVID-19 virus infection 02/13/2022   Hypertension    Pain due to onychomycosis of toenails of both feet 12/14/2020   History of abnormal mammogram 01/28/2019   Recurrent UTI 01/28/2019   Chronic kidney disease (CKD), stage III (moderate) ([place]) 11/11/2018   Long-term use of high-risk medication 10/13/2018   Anxiety 07/31/2017   Anxiety and depression 10/16/2016   GERD (gastroesophageal reflux disease) 10/16/2016   Hyperlipidemia associated with type 2 diabetes mellitus ([place]) 08/29/2016   Moderate intellectual disabilities 11/15/2015   IDA (iron deficiency anemia) 09/22/2014   Closed fracture of distal end of left humerus 06/08/2014   Headache 11/30/2013   Breast cancer ([place]) 04/18/2013   History of Clostridium difficile 10/30/2012   Symptomatic cholelithiasis 10/26/2012   Closed fracture of condyle of femur ([place]) 10/05/2012   Closed fracture of proximal tibia 10/05/2012   Cognitive deficits 10/05/2012   Diabetes mellitus ([place]) 10/05/2012   Hypertension associated with  diabetes ([place]) 10/05/2012   Hyperlipidemia, unspecified 10/05/2012   Obesity 10/05/2012   Vitamin D deficiency, unspecified 10/05/2012   Urinary tract infection 10/05/2012   Left tibial fracture 09/10/2012   Severe major depression with psychotic features ([place]) 09/02/2000   Vitamin B12 deficiency 09/02/2000    Orientation RESPIRATION BLADDER Height & Weight     Self  Normal Continent Weight: 134 lb 7.7 oz (61 kg) Height:  '5\' 2"'$  (157.5 cm)  BEHAVIORAL SYMPTOMS/MOOD NEUROLOGICAL BOWEL NUTRITION STATUS   (None)  (None) Continent Diet (DYS 3.)  AMBULATORY STATUS COMMUNICATION OF NEEDS Skin   Extensive Assist Verbally Surgical wounds, Other (Comment) (Erythema/redness. Incision on left leg: Honeycomb dressing.)                       Personal Care Assistance Level of Assistance  Bathing, Feeding, Dressing Bathing Assistance: Maximum assistance Feeding assistance: Limited assistance Dressing Assistance: Maximum assistance     Functional Limitations Info  Sight, Hearing, Speech Sight Info: Adequate Hearing Info: Adequate Speech Info: Impaired (Slurred/dysarthria)    SPECIAL CARE FACTORS FREQUENCY  PT (By licensed PT), OT (By licensed OT)     PT Frequency: 5 x week OT Frequency: 5 x week            Contractures Contractures Info: Not present    Additional Factors Info  Code Status, Allergies, Psychotropic Code Status Info: Full code Allergies Info: Pioglitazone Psychotropic Info: Anxiety, depression         Current Medications (02/19/2022):  This is the current hospital active medication list Current Facility-Administered Medications  Medication Dose Route Frequency Provider Last Rate Last Admin  acetaminophen (TYLENOL) tablet 325-650 mg  325-650 mg Oral Q6H PRN Poggi, Marshall Cork, MD       albuterol (PROVENTIL) (2.5 MG/3ML) 0.083% nebulizer solution 3 mL  3 mL Nebulization Q4H PRN Ivor Costa, MD       bisacodyl (DULCOLAX) suppository 10 mg  10 mg Rectal Daily PRN  Poggi, Marshall Cork, MD       brexpiprazole (REXULTI) tablet 1 mg  1 mg Oral q AM Poggi, Marshall Cork, MD   1 mg at 02/19/22 3329   cholecalciferol (VITAMIN D3) 25 MCG (1000 UNIT) tablet 1,000 Units  1,000 Units Oral Daily Poggi, Marshall Cork, MD   1,000 Units at 02/19/22 5188   clonazePAM (KLONOPIN) tablet 0.5 mg  0.5 mg Oral QHS Poggi, Marshall Cork, MD   0.5 mg at 02/18/22 2207   clonazePAM (KLONOPIN) tablet 1 mg  1 mg Oral Daily Poggi, Marshall Cork, MD   1 mg at 02/19/22 4166   dextromethorphan-guaiFENesin ([place] DM) 30-600 MG per 12 hr tablet 1 tablet  1 tablet Oral BID PRN Poggi, Marshall Cork, MD       diphenhydrAMINE (BENADRYL) 12.5 MG/5ML elixir 12.5-25 mg  12.5-25 mg Oral Q4H PRN Poggi, Marshall Cork, MD       divalproex (DEPAKOTE SPRINKLE) capsule 250 mg  250 mg Oral Q lunch Poggi, Marshall Cork, MD   250 mg at 02/18/22 1259   And   divalproex (DEPAKOTE SPRINKLE) capsule 250 mg  250 mg Oral Daily Poggi, Marshall Cork, MD   250 mg at 02/19/22 0630   And   divalproex (DEPAKOTE SPRINKLE) capsule 375 mg  375 mg Oral QHS Corky Mull, MD   375 mg at 02/18/22 2206   docusate sodium (COLACE) capsule 100 mg  100 mg Oral BID Corky Mull, MD   100 mg at 02/19/22 0958   donepezil (ARICEPT) tablet 5 mg  5 mg Oral BID Corky Mull, MD   5 mg at 02/19/22 0958   DULoxetine (CYMBALTA) DR capsule 30 mg  30 mg Oral Daily Poggi, Marshall Cork, MD   30 mg at 02/19/22 0958   DULoxetine (CYMBALTA) DR capsule 60 mg  60 mg Oral Daily Poggi, Marshall Cork, MD   60 mg at 02/19/22 0958   enoxaparin (LOVENOX) injection 40 mg  40 mg Subcutaneous Q24H Mickeal Skinner A, RPH   40 mg at 02/18/22 2210   ferrous gluconate (FERGON) tablet 324 mg  324 mg Oral BID WC Lorella Nimrod, MD   324 mg at 16/01/09 3235   folic acid (FOLVITE) tablet 0.5 mg  0.5 mg Oral Daily Poggi, Marshall Cork, MD   0.5 mg at 02/19/22 0957   hydrALAZINE (APRESOLINE) injection 5 mg  5 mg Intravenous Q2H PRN Poggi, Marshall Cork, MD       HYDROcodone-acetaminophen (NORCO) 7.5-325 MG per tablet 1 tablet  1 tablet Oral Q6H PRN  Poggi, Marshall Cork, MD   1 tablet at 02/19/22 5732   letrozole [facility]) tablet 2.5 mg  2.5 mg Oral Daily Poggi, Marshall Cork, MD   2.5 mg at 02/19/22 0959   magnesium hydroxide (MILK OF MAGNESIA) suspension 30 mL  30 mL Oral Daily PRN Corky Mull, MD   30 mL at 02/19/22 0828   memantine (NAMENDA) tablet 10 mg  10 mg Oral BID Corky Mull, MD   10 mg at 02/19/22 0958   metoCLOPramide (REGLAN) tablet 5-10 mg  5-10 mg Oral Q8H PRN Poggi, Marshall Cork, MD  Or   metoCLOPramide (REGLAN) injection 5-10 mg  5-10 mg Intravenous Q8H PRN Poggi, Marshall Cork, MD       mirtazapine (REMERON) tablet 7.5 mg  7.5 mg Oral QHS Poggi, Marshall Cork, MD   7.5 mg at 02/18/22 2207   morphine (PF) 2 MG/ML injection 1-2 mg  1-2 mg Intravenous Q2H PRN Poggi, Marshall Cork, MD       nystatin (MYCOSTATIN/NYSTOP) topical powder   Topical BID Poggi, Marshall Cork, MD   Given at 02/19/22 1000   ondansetron (ZOFRAN) tablet 4 mg  4 mg Oral Q6H PRN Poggi, Marshall Cork, MD       Or   ondansetron [facility]) injection 4 mg  4 mg Intravenous Q6H PRN Poggi, Marshall Cork, MD   4 mg at 02/16/22 1139   pantoprazole (PROTONIX) EC tablet 40 mg  40 mg Oral q AM Poggi, Marshall Cork, MD   40 mg at 02/19/22 9355   polyethylene glycol (MIRALAX / GLYCOLAX) packet 17 g  17 g Oral Q2000 Poggi, Marshall Cork, MD   17 g at 02/18/22 2100   QUEtiapine (SEROQUEL) tablet 300 mg  300 mg Oral QHS Poggi, Marshall Cork, MD   300 mg at 02/18/22 2205   QUEtiapine (SEROQUEL) tablet 50 mg  50 mg Oral BID Corky Mull, MD   50 mg at 02/19/22 2174   sodium phosphate (FLEET) 7-19 GM/118ML enema 1 enema  1 enema Rectal Once PRN Poggi, Marshall Cork, MD         Discharge Medications: Please see discharge summary for a list of discharge medications.  Relevant Imaging Results:  Relevant Lab Results:   Additional Information SS#: [id number]. Brother is legal guardian. From [facility].  Candie Chroman, LCSW

## 2022-02-19 NOTE — Plan of Care (Signed)

## 2022-02-19 NOTE — Progress Notes (Signed)
Occupational Therapy Treatment Patient Details Name: Carly Collins MRN: [id number] DOB: 1956/02/23 Today's Date: 02/19/2022   History of present illness Pt is a 66 y/o F admitted on 02/13/22 after presenting following fall & confusion. Pt  found to have positive urinalysis as well as minimally displaced L lower femoral fx.  Pt underwent L ORIF 02/15/22 by Dr. Roland Rack & is now NWB LLE. PMH: significant cognitive impairment, HTN, HLD, diet controlled diabetes, GERD, depression with anxiety, recurrent UTI, c diff colitis, iron deficiency anemia, breast CA   OT comments  Upon entering session, pt resting in bed and agreeable to OT. Tx session targeted improving functional balance during ADL tasks in preparation for improved functional transfers and ADL task completion. Pt required Min A for bed mobility, Mod A to complete UB dressing, and set up-supervision for seated grooming tasks. Pt tolerated sitting EOB for ~5 min with supervision. Pt left as received with all needs in reach. Pt is making progress toward goal completion. D/C recommendation remains appropriate. OT will continue to follow acutely.     Recommendations for follow up therapy are one component of a multi-disciplinary discharge planning process, led by the attending physician.  Recommendations may be updated based on patient status, additional functional criteria and insurance authorization.    Follow Up Recommendations  Skilled nursing-short term rehab (<3 hours/day) (if staff is able to provide 24/7 supervision, minimal assist with transfers [place] is preferred and recommended as pt will likely benefit from familiar environment)    Assistance Recommended at Discharge Frequent or constant Supervision/Assistance  Patient can return home with the following  A little help with walking and/or transfers;A little help with bathing/dressing/bathroom   Equipment Recommendations  BSC/3in1;Tub/shower seat;Wheelchair (measurements OT)     Recommendations for Other Services      Precautions / Restrictions Precautions Precautions: Fall Precaution Comments: LLE hinged knee brace locked in extension, can remove to perform gentle ROM with therapy Restrictions Weight Bearing Restrictions: Yes LLE Weight Bearing: Non weight bearing       Mobility Bed Mobility Overal bed mobility: Needs Assistance Bed Mobility: Supine to Sit, Sit to Supine     Supine to sit: Min assist Sit to supine: Min assist   General bed mobility comments: to manage LLE    Transfers                   General transfer comment: pt deferred     Balance Overall balance assessment: Needs assistance Sitting-balance support: Feet supported, Bilateral upper extremity supported Sitting balance-Leahy Scale: Fair Sitting balance - Comments: supervision static sitting       Standing balance comment: pt deferred                           ADL either performed or assessed with clinical judgement   ADL Overall ADL's : Needs assistance/impaired     Grooming: Wash/dry face;Sitting;Set up;Brushing hair           Upper Body Dressing : Moderate assistance;Sitting                          Extremity/Trunk Assessment Upper Extremity Assessment Upper Extremity Assessment: Overall WFL for tasks assessed   Lower Extremity Assessment Lower Extremity Assessment: LLE deficits/detail LLE Deficits / Details: edema noted throughout LLE, L hinge break donned and locked in extension throughout   Cervical / Trunk Assessment Cervical / Trunk Assessment: Normal  Vision Patient Visual Report: No change from baseline     Perception     Praxis      Cognition Arousal/Alertness: Awake/alert Behavior During Therapy: WFL for tasks assessed/performed Overall Cognitive Status: No family/caregiver present to determine baseline cognitive functioning Area of Impairment: Orientation, Attention, Memory, Following commands,  Safety/judgement, Awareness, Problem solving                 Orientation Level: Disoriented to, Place, Time, Situation Current Attention Level: Focused   Following Commands: Follows one step commands with increased time Safety/Judgement: Decreased awareness of deficits   Problem Solving: Slow processing, Decreased initiation, Difficulty sequencing, Requires verbal cues, Requires tactile cues General Comments: responds "yes" to all questions, reports her LLE hurts, repeatedly stating "I don't feel good"        Exercises      Shoulder Instructions       General Comments      Pertinent Vitals/ Pain       Pain Assessment Pain Assessment: Faces Faces Pain Scale: Hurts little more Pain Location: LLE Pain Descriptors / Indicators: Discomfort, Grimacing Pain Intervention(s): Limited activity within patient's tolerance, Monitored during session, Repositioned, Patient requesting pain meds-RN notified  Home Living                                          Prior Functioning/Environment              Frequency  Min 2X/week        Progress Toward Goals  OT Goals(current goals can now be found in the care plan section)  Progress towards OT goals: Progressing toward goals  Acute Rehab OT Goals Patient Stated Goal: reduce pain OT Goal Formulation: With patient Time For Goal Achievement: 03/02/22 Potential to Achieve Goals: Good  Plan Discharge plan remains appropriate;Frequency remains appropriate    Co-evaluation                 AM-PAC OT "6 Clicks" Daily Activity     Outcome Measure   Help from another person eating meals?: None Help from another person taking care of personal grooming?: A Little Help from another person toileting, which includes using toliet, bedpan, or urinal?: A Lot Help from another person bathing (including washing, rinsing, drying)?: A Lot Help from another person to put on and taking off regular upper body  clothing?: A Little Help from another person to put on and taking off regular lower body clothing?: A Lot 6 Click Score: 16    End of Session    OT Visit Diagnosis: Unsteadiness on feet (R26.81);History of falling (Z91.81);Pain Pain - Right/Left: Left Pain - part of body: Leg   Activity Tolerance Patient limited by pain;Patient limited by fatigue   Patient Left in bed;with call bell/phone within reach;with bed alarm set   Nurse Communication Mobility status        Time: 0354-6568 OT Time Calculation (min): 15 min  Charges: OT General Charges $OT Visit: 1 Visit OT Treatments $Self Care/Home Management : 8-22 mins  [facility] MS, OTR/L ascom [phone]  02/19/22, 4:41 PM

## 2022-02-19 NOTE — Plan of Care (Signed)
  Problem: Education: Goal: Knowledge of General Education information will improve Description: Including pain rating scale, medication(s)/side effects and non-pharmacologic comfort measures Outcome: Progressing   Problem: Clinical Measurements: Goal: Ability to maintain clinical measurements within normal limits will improve Outcome: Progressing   Problem: Activity: Goal: Risk for activity intolerance will decrease Outcome: Progressing   Problem: Nutrition: Goal: Adequate nutrition will be maintained Outcome: Progressing   Problem: Elimination: Goal: Will not experience complications related to bowel motility Outcome: Progressing   Problem: Pain Managment: Goal: General experience of comfort will improve Outcome: Progressing   Problem: Safety: Goal: Ability to remain free from injury will improve Outcome: Progressing   Problem: Skin Integrity: Goal: Risk for impaired skin integrity will decrease Outcome: Progressing   

## 2022-02-19 NOTE — Assessment & Plan Note (Signed)
Urine cultures with pansensitive E. coli Patient has recurrent UTI.  No sepsis. Completed a course of antibiotics.

## 2022-02-19 NOTE — Progress Notes (Signed)
Physical Therapy Treatment Patient Details Name: Carly Collins MRN: [id number] DOB: Oct 18, 1955 Today's Date: 02/19/2022   History of Present Illness Pt is a 66 y/o F admitted on 02/13/22 after presenting following fall & confusion. Pt  found to have positive urinalysis as well as minimally displaced L lower femoral fx.  Pt underwent L ORIF 02/15/22 by Dr. Roland Rack & is now NWB LLE. PMH: significant cognitive impairment, HTN, HLD, diet controlled diabetes, GERD, depression with anxiety, recurrent UTI, c diff colitis, iron deficiency anemia, breast CA    PT Comments    Pt up in chair upon arrival with nursing staff. Supine and seated AAROM with brace unlocked for gentle knee flexion.  Pt asking to use bathroom.  Stood and transferred with min a x 2 with cues for NWB RLE but it remains difficult for her.  Voids in [facility] before returning to chair and being very anxious to eat breakfast.  Set up and all needs met.    Continue to recommend SNF unless group home can provide assistance for her as needed.  Will need to be wheelchair level with transfers only either RW or lateral scoots to maintain true NWB until cleared by MD for increased WB with HHPT/OT through group home services..   Recommendations for follow up therapy are one component of a multi-disciplinary discharge planning process, led by the attending physician.  Recommendations may be updated based on patient status, additional functional criteria and insurance authorization.  Follow Up Recommendations  Skilled nursing-short term rehab (<3 hours/day) (HHPT is preferred if staff with [facility] services can provide 24 hr assistance at d/c) Can patient physically be transported by private vehicle: No   Assistance Recommended at Discharge Frequent or constant Supervision/Assistance  Patient can return home with the following Two people to help with walking and/or transfers;A little help with bathing/dressing/bathroom;Help with stairs or ramp for  entrance   Equipment Recommendations  Rolling walker (2 wheels);Wheelchair cushion (measurements PT);Wheelchair (measurements PT) (elevating leg rests)    Recommendations for Other Services OT consult     Precautions / Restrictions Precautions Precautions: Fall Precaution Comments: LLE hinged knee brace locked in extension, can remove to perform gentle ROM with therapy Restrictions Weight Bearing Restrictions: Yes LLE Weight Bearing: Non weight bearing     Mobility  Bed Mobility               General bed mobility comments: in chair with nursing prior    Transfers Overall transfer level: Needs assistance Equipment used: Rolling walker (2 wheels) Transfers: Bed to chair/wheelchair/BSC Sit to Stand: Min assist, +2 physical assistance   Step pivot transfers: Min assist, +2 safety/equipment, +2 physical assistance       General transfer comment: does well with transfer but continues to have difficulyt with NWB.  Limited to transfers only in standing on/off commode.    Ambulation/Gait               General Gait Details: no true gait but pivots on RLE   Stairs             Wheelchair Mobility    Modified Rankin (Stroke Patients Only)       Balance   Sitting-balance support: Feet supported, Bilateral upper extremity supported Sitting balance-Leahy Scale: Fair     Standing balance support: Bilateral upper extremity supported, During functional activity Standing balance-Leahy Scale: Poor Standing balance comment: +2 for hands on assist as she does not follow cues for safety well  Cognition Arousal/Alertness: Awake/alert Behavior During Therapy: WFL for tasks assessed/performed, Flat affect Overall Cognitive Status: No family/caregiver present to determine baseline cognitive functioning                                          Exercises Other Exercises Other Exercises: supine and seated  AAROM    General Comments        Pertinent Vitals/Pain Pain Assessment Pain Assessment: Faces Faces Pain Scale: Hurts little more Pain Location: LLE  - with mobility Pain Descriptors / Indicators: Discomfort, Grimacing Pain Intervention(s): Limited activity within patient's tolerance, Monitored during session, Repositioned    Home Living                          Prior Function            PT Goals (current goals can now be found in the care plan section) Progress towards PT goals: Progressing toward goals    Frequency    7X/week      PT Plan      Co-evaluation              AM-PAC PT "6 Clicks" Mobility   Outcome Measure  Help needed turning from your back to your side while in a flat bed without using bedrails?: A Little Help needed moving from lying on your back to sitting on the side of a flat bed without using bedrails?: A Little Help needed moving to and from a bed to a chair (including a wheelchair)?: A Lot Help needed standing up from a chair using your arms (e.g., wheelchair or bedside chair)?: A Little Help needed to walk in hospital room?: Total Help needed climbing 3-5 steps with a railing? : Total 6 Click Score: 13    End of Session Equipment Utilized During Treatment:  (L hinged knee brace donned & locked in extension) Activity Tolerance: Patient tolerated treatment well (limited by not feeling well & inability to maintain NWB LLE) Patient left: in chair;with chair alarm set;with call bell/phone within reach Nurse Communication: Mobility status PT Visit Diagnosis: Unsteadiness on feet (R26.81);Muscle weakness (generalized) (M62.81);Difficulty in walking, not elsewhere classified (R26.2);Other abnormalities of gait and mobility (R26.89)     Time: 2637-8588 PT Time Calculation (min) (ACUTE ONLY): 13 min  Charges:  $Therapeutic Exercise: 8-22 mins                   Chesley Noon, PTA 02/19/22, 9:03 AM

## 2022-02-19 NOTE — Assessment & Plan Note (Signed)
Hemoglobin 8.7>>8.1>>6.8>>9.0>>8.4>>8.3  Received 1 unit of PRBC -Continue to monitor - Continue iron supplement-dose increased to twice daily

## 2022-02-19 NOTE — Plan of Care (Signed)
  Problem: Education: Goal: Knowledge of General Education information will improve Description: Including pain rating scale, medication(s)/side effects and non-pharmacologic comfort measures Outcome: Adequate for Discharge   Problem: Clinical Measurements: Goal: Ability to maintain clinical measurements within normal limits will improve Outcome: Adequate for Discharge   Problem: Clinical Measurements: Goal: Respiratory complications will improve Outcome: Adequate for Discharge   Problem: Nutrition: Goal: Adequate nutrition will be maintained Outcome: Adequate for Discharge   Problem: Coping: Goal: Level of anxiety will decrease Outcome: Adequate for Discharge   Problem: Safety: Goal: Ability to remain free from injury will improve Outcome: Adequate for Discharge   Problem: Skin Integrity: Goal: Risk for impaired skin integrity will decrease Outcome: Adequate for Discharge   Problem: Activity: Goal: Risk for activity intolerance will decrease Outcome: Adequate for Discharge

## 2022-02-19 NOTE — TOC Progression Note (Signed)
Transition of Care [facility]) - Progression Note    Patient Details  Name: Carly Collins MRN: [id number] Date of Birth: 1955/07/13  Transition of Care [facility]) CM/SW [place], LCSW Phone Number: 02/19/2022, 1:41 PM  Clinical Narrative:  Received call back from brother. He is agreeable to SNF. Started workup.  Expected Discharge Plan and Services                                                 Social Determinants of Health (SDOH) Interventions Food Insecurity Interventions: Patient Refused Housing Interventions: Patient Refused Transportation Interventions: Patient Refused Utilities Interventions: Patient Refused  Readmission Risk Interventions     No data to display

## 2022-02-20 DIAGNOSIS — S72402A Unspecified fracture of lower end of left femur, initial encounter for closed fracture: Secondary | ICD-10-CM | POA: Diagnosis not present

## 2022-02-20 DIAGNOSIS — N3000 Acute cystitis without hematuria: Secondary | ICD-10-CM | POA: Diagnosis not present

## 2022-02-20 LAB — GLUCOSE, CAPILLARY: Glucose-Capillary: 116 mg/dL — ABNORMAL HIGH (ref 70–99)

## 2022-02-20 MED ORDER — ENOXAPARIN SODIUM 40 MG/0.4ML IJ SOSY: 40.0000 mg | PREFILLED_SYRINGE | INTRAMUSCULAR | 0 refills | Status: AC

## 2022-02-20 NOTE — Progress Notes (Signed)
Progress Note   Patient: Carly Collins:[id number] DOB: [birth year] DOA: 02/13/2022     6 DOS: the patient was seen and examined on 02/20/2022   Brief hospital course: Taken from H&P.  Carly Collins is a 66 y.o. female with medical history significant of cognitive impairment, hypertension, hyperlipidemia, diet-controlled diabetes, GERD, depression with anxiety, recurrent UTI, C. difficile colitis, iron deficiency anemia, breast cancer, who presents with fall and confusion.   Patient has history of cognitive impairment, cannot provide accurate medical history.  Unable to reach any family member so history was obtained from chart review.Per report, patient had positive COVID test 1 week ago. She has mild dry cough, does not seem to have shortness of breath and chest pain.  Today her COVID test is negative.  Patient was found to be more confused than her baseline in facility.  She fell twice in facility. Unclear if she hit her head or had loss of consciousness. Patient has dysuria and suprapubic abdominal pain.  Data reviewed independently and ED Course: pt was found to have positive urinalysis (hazy appearance, large amount of leukocyte, positive nitrite, rare bacteria, WBC> 50), negative COVID PCR, WBC 6.6, GFR> 60, temperature normal, blood pressure 110/88, heart rate 58, RR 19, oxygen saturation 98% on room air.  Chest x-ray negative.  CT head is negative.   10/5: Patient was complaining of left leg pain and unable to bear weight.  Her left leg seems swollen.  Imaging was ordered and found to have minimally displaced left lower femoral fracture. Orthopedic was consulted. Preliminary urine cultures with E. coli pending susceptibility. 10/6: Hemodynamically stable.  Orthopedic surgery is recommending surgery to stabilize her fracture.  Message sent to her nephew who is also her POA for concent. Patient will be going to the OR later today.  10/7: S/p ORIF yesterday.  Patient will be  nonweightbearing on left lower extremity.  Urine cultures with pansensitive E. Coli. Hemoglobin dropped to 6.8 from 8 postoperatively-ordered 1 unit of PRBC.  PT and OT are recommending SNF. TOC consult for placement.  10/8: Patient remained stable.  Hemoglobin improved to 9.  Restarting Lovenox as she is high risk for DVT. Patient will remain nonweightbearing on the left lower extremity per orthopedic surgery.  10/9: Hemodynamically stable, appears pale so CBC was rechecked and hemoglobin at 8.4.  Increasing the dose of ferrous sulfate to twice daily.  PT and OT are recommending SNF.  Patient will remain nonweightbearing on left lower extremity and will follow-up with orthopedic surgery as an outpatient in 2 weeks. Talk with mother on phone who herself is 28 years old.  She was very upset with [place] that she fell in the hospital and broke her leg.  I tried explaining multiple times that she fell at the group home but she was keeps repeating that group home called her and told her that she fell in the hospital not at group home?? Patient was admitted from group home with a complaint of fall and altered mental status.  10/10: Patient remained stable.  Working with PT.  [name] is working on SNF placement.  Hemoglobin seems stable around 8.3.  10/11: Remains stable.  Still no bed offers.   Assessment and Plan: * UTI (urinary tract infection) Urine cultures with pansensitive E. coli Patient has recurrent UTI.  No sepsis. Completed a course of antibiotics.  Femoral distal fracture (HCC) S/p ORIF on 02/15/2022. Most likely secondary to fall.  Imaging with concern of minimally displaced left distal femoral  fracture. Patient will be NWB on left lower extremity. -PT/OT are recommending SNF -Continue with pain management -Outpatient orthopedic follow-up in 2 weeks  Acute metabolic encephalopathy Improved.  Seems to be at baseline now Per report, patient is more confused than baseline.  Patient has  history of cognitive impairment. Not sure about baseline mental status.  CT head negative.  Likely due to UTI   Hypertension Blood pressure within goal.  Pt is not taking meds now - IV hydralazine as needed  Breast cancer (HCC) -continue letrozole  Anxiety and depression - Continue home medications:  IDA (iron deficiency anemia) Hemoglobin 8.7>>8.1>>6.8>>9.0>>8.4>>8.3  Received 1 unit of PRBC -Continue to monitor - Continue iron supplement-dose increased to twice daily  Intertriginous dermatitis associated with moisture - Topical nystatin powder  Cognitive deficits Continue donepezil, Namenda  COVID-19 virus infection Patient has mild dry cough, no fever.  Oxygen saturation 98% on room air.  Chest x-ray negative.  Patient was started on molnupiravir on 10/3 in facility.  Today COVID test is negative. -Will not continue molnupiravir -As needed albuterol and Mucinex   Subjective: Patient was seen and examined today.  She was sitting comfortably in chair and eating her lunch.  No new complaints.  Physical Exam: Vitals:   02/19/22 1457 02/19/22 2329 02/20/22 0750 02/20/22 1535  BP: (!) 147/70 (!) 105/51 102/67 122/76  Pulse: 79 76 71 89  Resp: '17 16 16 16  '$ Temp: 97.7 F (36.5 C) 98.7 F (37.1 C) 98.2 F (36.8 C) 98 F (36.7 C)  TempSrc: Oral     SpO2: 99% 92% 94% 98%  Weight:      Height:       General.     In no acute distress. Pulmonary.  Lungs clear bilaterally, normal respiratory effort. CV.  Regular rate and rhythm, no JVD, rub or murmur. Abdomen.  Soft, nontender, nondistended, BS positive. CNS.  Alert and oriented .  No focal neurologic deficit. Extremities.  No edema, no cyanosis, pulses intact and symmetrical. Psychiatry.  Judgment and insight appears impaired.  Data Reviewed: Prior data reviewed  Family Communication: Called brother and left a Dance movement psychotherapist.  Disposition: Status is: Inpatient Remains inpatient appropriate because: Severity of  illness   Planned Discharge Destination: Skilled nursing facility  DVT prophylaxis. Lovenox Time spent: 38 minutes  This record has been created using Systems analyst. Errors have been sought and corrected,but may not always be located. Such creation errors do not reflect on the standard of care.  Author: Lorella Nimrod, MD 02/20/2022 3:47 PM  For on call review [email or web address].

## 2022-02-20 NOTE — TOC Progression Note (Signed)
Transition of Care [facility]) - Progression Note    Patient Details  Name: Carly Collins MRN: [id number] Date of Birth: 05/24/1955  Transition of Care [facility]) CM/SW [place], RN Phone Number: 02/20/2022, 2:54 PM  Clinical Narrative:     NO bed offers, expanded the [facility]       Expected Discharge Plan and Services                                                 Social Determinants of Health (SDOH) Interventions Food Insecurity Interventions: Patient Refused Housing Interventions: Patient Refused Transportation Interventions: Patient Refused Utilities Interventions: Patient Refused  Readmission Risk Interventions     No data to display

## 2022-02-20 NOTE — Plan of Care (Signed)
  Problem: Education: Goal: Knowledge of General Education information will improve Description: Including pain rating scale, medication(s)/side effects and non-pharmacologic comfort measures Outcome: Progressing   Problem: Elimination: Goal: Will not experience complications related to bowel motility Outcome: Progressing   Problem: Nutrition: Goal: Adequate nutrition will be maintained Outcome: Progressing   Problem: Coping: Goal: Level of anxiety will decrease Outcome: Progressing   Problem: Activity: Goal: Risk for activity intolerance will decrease Outcome: Progressing   Problem: Safety: Goal: Ability to remain free from injury will improve Outcome: Progressing   Problem: Pain Managment: Goal: General experience of comfort will improve Outcome: Progressing   Problem: Skin Integrity: Goal: Risk for impaired skin integrity will decrease Outcome: Progressing

## 2022-02-20 NOTE — Progress Notes (Signed)
Subjective: 5 Days Post-Op Procedure(s) (LRB): OPEN REDUCTION INTERNAL FIXATION (ORIF) DISTAL FEMUR FRACTURE (Left) Patient reports pain as mild.  Patient is well, and has had no acute complaints or problems Denies any CP, SOB, ABD pain. We will continue therapy today.  Patient has had a BM since surgery.  Objective: Vital signs in last 24 hours: Temp:  [97.7 F (36.5 C)-98.7 F (37.1 C)] 98.2 F (36.8 C) (10/11 0750) Pulse Rate:  [71-79] 71 (10/11 0750) Resp:  [16-17] 16 (10/11 0750) BP: (102-147)/(51-70) 102/67 (10/11 0750) SpO2:  [92 %-99 %] 94 % (10/11 0750)  Intake/Output from previous day: 10/10 0701 - 10/11 0700 In: -  Out: 1450 [Urine:1450] Intake/Output this shift: Total I/O In: 240 [P.O.:240] Out: -   Recent Labs    02/18/22 1033 02/19/22 0528  HGB 8.4* 8.3*   Recent Labs    02/18/22 1033 02/19/22 0528  WBC 7.7 9.0  RBC 2.78* 2.77*  HCT 25.0* 25.1*  PLT 245 288   No results for input(s): "NA", "K", "CL", "CO2", "BUN", "CREATININE", "GLUCOSE", "CALCIUM" in the last 72 hours.  No results for input(s): "LABPT", "INR" in the last 72 hours.  EXAM General - Patient is Alert and Appropriate Extremity - Neurovascular intact Sensation intact distally Intact pulses distally Dorsiflexion/Plantar flexion intact No cellulitis present Compartment soft Knee ROM brace intact Dressing - dressing C/D/I and no drainage Motor Function - intact, moving foot and toes well on exam.   Past Medical History:  Diagnosis Date   Anxiety    C. difficile enteritis    Cancer ([place])    Depression    Diabetes mellitus without complication ([place])    GERD (gastroesophageal reflux disease)    Headache    Hyperlipidemia    Hypertension    Mental retardation, mild (I.Q. 50-70)    Moderate intellectual disabilities    Obesity    Recurrent UTI    Tibial fracture    Vitamin D deficiency     Assessment/Plan:   5 Days Post-Op Procedure(s) (LRB): OPEN REDUCTION INTERNAL  FIXATION (ORIF) DISTAL FEMUR FRACTURE (Left) Principal Problem:   UTI (urinary tract infection) Active Problems:   Anxiety and depression   Breast cancer (HCC)   Cognitive deficits   IDA (iron deficiency anemia)   Acute metabolic encephalopathy   Hypertension   Intertriginous dermatitis associated with moisture   COVID-19 virus infection   Femoral distal fracture (HCC)  Estimated body mass index is 24.6 kg/m as calculated from the following:   Height as of this encounter: '5\' 2"'$  (1.575 m).   Weight as of this encounter: 61 kg. Advance diet Up with therapy, NWB LLE Pain controlled VSS Hg 8.3 yesterday, plan will be to continue Lovenox at this time. CM to assist with discharge to SNF  Follow up with [facility] ortho in 2 weeks.  Upon discharge, continue Lovenox '40mg'$  daily for DVT prophylaxis. NWB LLE  DVT Prophylaxis - Lovenox, TED hose, and SCDs Non-Weight bearing Left lower extremity  J. Cameron Proud, PA-C [place] 02/20/2022, 10:35 AM

## 2022-02-20 NOTE — Progress Notes (Signed)
Physical Therapy Treatment Patient Details Name: Carly Collins MRN: [id number] DOB: [birth year] Today's Date: 02/20/2022   History of Present Illness Pt is a 66 y/o F admitted on 02/13/22 after presenting following fall & confusion. Pt  found to have positive urinalysis as well as minimally displaced L lower femoral fx.  Pt underwent L ORIF 02/15/22 by Dr. Roland Rack & is now NWB LLE. PMH: significant cognitive impairment, HTN, HLD, diet controlled diabetes, GERD, depression with anxiety, recurrent UTI, c diff colitis, iron deficiency anemia, breast CA    PT Comments    Pt agreed to mobility. Good demonstration of supine to sit with MinA to scoot to edge of bed and provide support to L LE. Good static unsupported sitting balance. Pt able to stand and pivot to R bedside chair with ModAx1, however struggled with NWB compliance on L LE. Pt positioned to comfort with LE's elevated. All needs in reach, chair alarm on, call bell in reach. Will continue to recommend SNF once medically cleared for d/c.   Recommendations for follow up therapy are one component of a multi-disciplinary discharge planning process, led by the attending physician.  Recommendations may be updated based on patient status, additional functional criteria and insurance authorization.  Follow Up Recommendations  Skilled nursing-short term rehab (<3 hours/day) Can patient physically be transported by private vehicle: No   Assistance Recommended at Discharge Frequent or constant Supervision/Assistance  Patient can return home with the following Two people to help with walking and/or transfers;A little help with bathing/dressing/bathroom;Help with stairs or ramp for entrance   Equipment Recommendations  Rolling walker (2 wheels);Wheelchair cushion (measurements PT);Wheelchair (measurements PT)    Recommendations for Other Services OT consult     Precautions / Restrictions Precautions Precautions: Fall Restrictions Weight Bearing  Restrictions: Yes LLE Weight Bearing: Non weight bearing     Mobility  Bed Mobility Overal bed mobility: Needs Assistance Bed Mobility: Supine to Sit, Sit to Supine     Supine to sit: Min assist Sit to supine: Min assist   General bed mobility comments: to manage LLE    Transfers Overall transfer level: Needs assistance Equipment used: 1 person hand held assist Transfers: Bed to chair/wheelchair/BSC, Sit to/from Stand Sit to Stand: Mod assist Stand pivot transfers: Mod assist         General transfer comment: ModAx1 for transfer, poor ability to fully maintain NWB L LE    Ambulation/Gait               General Gait Details: no true gait but pivots on RLE   Stairs             Wheelchair Mobility    Modified Rankin (Stroke Patients Only)       Balance                                            Cognition Arousal/Alertness: Awake/alert Behavior During Therapy: WFL for tasks assessed/performed Overall Cognitive Status: No family/caregiver present to determine baseline cognitive functioning Area of Impairment: Orientation, Attention, Memory, Following commands, Safety/judgement, Awareness, Problem solving                 Orientation Level: Disoriented to, Place, Time, Situation Current Attention Level: Focused   Following Commands: Follows one step commands with increased time Safety/Judgement: Decreased awareness of deficits Awareness: Intellectual Problem Solving: Slow processing, Decreased initiation, Difficulty sequencing,  Requires verbal cues, Requires tactile cues          Exercises      General Comments        Pertinent Vitals/Pain Pain Assessment Pain Assessment: Faces Faces Pain Scale: Hurts little more Pain Location: LLE Pain Descriptors / Indicators: Discomfort, Grimacing Pain Intervention(s): Monitored during session    Home Living                          Prior Function             PT Goals (current goals can now be found in the care plan section) Acute Rehab PT Goals Patient Stated Goal: feel better    Frequency    7X/week      PT Plan Current plan remains appropriate    Co-evaluation              AM-PAC PT "6 Clicks" Mobility   Outcome Measure  Help needed turning from your back to your side while in a flat bed without using bedrails?: A Little Help needed moving from lying on your back to sitting on the side of a flat bed without using bedrails?: A Little Help needed moving to and from a bed to a chair (including a wheelchair)?: A Lot Help needed standing up from a chair using your arms (e.g., wheelchair or bedside chair)?: A Little Help needed to walk in hospital room?: Total Help needed climbing 3-5 steps with a railing? : Total 6 Click Score: 13    End of Session Equipment Utilized During Treatment: Gait belt Activity Tolerance: Patient tolerated treatment well;Other (comment) (Limited due to non-compliance with weight bearing restrictions) Patient left: in chair;with chair alarm set;with call bell/phone within reach Nurse Communication: Mobility status PT Visit Diagnosis: Unsteadiness on feet (R26.81);Muscle weakness (generalized) (M62.81);Difficulty in walking, not elsewhere classified (R26.2);Other abnormalities of gait and mobility (R26.89)     Time: 1430-1451 PT Time Calculation (min) (ACUTE ONLY): 21 min  Charges:  $Therapeutic Activity: 8-22 mins                    Mikel Cella, PTA    Carly Collins 02/20/2022, 2:57 PM

## 2022-02-20 NOTE — Progress Notes (Signed)
Occupational Therapy Treatment Patient Details Name: Carly Collins MRN: [id number] DOB: 1956/01/12 Today's Date: 02/20/2022   History of present illness Pt is a 66 y/o F admitted on 02/13/22 after presenting following fall & confusion. Pt  found to have positive urinalysis as well as minimally displaced L lower femoral fx.  Pt underwent L ORIF 02/15/22 by Dr. Roland Rack & is now NWB LLE. PMH: significant cognitive impairment, HTN, HLD, diet controlled diabetes, GERD, depression with anxiety, recurrent UTI, c diff colitis, iron deficiency anemia, breast CA   OT comments  Upon entering session, pt sitting in recliner and agreeable to OT. Pt found to have incontinent BM episode. Pt required Max A for posterior hygiene in standing, requiring one seated rest break before standing again to finish cleaning pt up. Pt then completed stand pivot transfer back to bed with Min A +2 using RW. Pt demonstrated poor ability to maintain NWB LLE during session. Pt required Mod A for UB dressing and Max A for LB dressing this date. Pt left in bed with all needs in reach. Pt is making progress toward goal completion. D/C recommendation remains appropriate. OT will continue to follow acutely.     Recommendations for follow up therapy are one component of a multi-disciplinary discharge planning process, led by the attending physician.  Recommendations may be updated based on patient status, additional functional criteria and insurance authorization.    Follow Up Recommendations  Skilled nursing-short term rehab (<3 hours/day)    Assistance Recommended at Discharge Frequent or constant Supervision/Assistance  Patient can return home with the following  A little help with walking and/or transfers;A little help with bathing/dressing/bathroom   Equipment Recommendations  BSC/3in1;Tub/shower seat;Wheelchair (measurements OT)    Recommendations for Other Services      Precautions / Restrictions Precautions Precautions:  Fall Precaution Comments: LLE hinged knee brace locked in extension, can remove to perform gentle ROM with therapy Restrictions Weight Bearing Restrictions: Yes LLE Weight Bearing: Non weight bearing       Mobility Bed Mobility Overal bed mobility: Needs Assistance Bed Mobility: Sit to Supine       Sit to supine: Min assist   General bed mobility comments: to manage LLE    Transfers Overall transfer level: Needs assistance Equipment used: Rolling walker (2 wheels) Transfers: Sit to/from Stand, Bed to chair/wheelchair/BSC Sit to Stand: +2 physical assistance, Min assist, +2 safety/equipment Stand pivot transfers: Min assist, +2 physical assistance, +2 safety/equipment         General transfer comment: poor ability to maintain NWB LLE     Balance Overall balance assessment: Needs assistance Sitting-balance support: Feet supported, Bilateral upper extremity supported Sitting balance-Leahy Scale: Fair     Standing balance support: Bilateral upper extremity supported, During functional activity Standing balance-Leahy Scale: Poor                             ADL either performed or assessed with clinical judgement   ADL Overall ADL's : Needs assistance/impaired Eating/Feeding: Set up;Sitting               Upper Body Dressing : Moderate assistance;Sitting Upper Body Dressing Details (indicate cue type and reason): to don/doff soiled gown Lower Body Dressing: Maximal assistance;Sitting/lateral leans Lower Body Dressing Details (indicate cue type and reason): to doff socks     Toileting- Clothing Manipulation and Hygiene: Maximal assistance;+2 for physical assistance Toileting - Clothing Manipulation Details (indicate cue type and reason): pt  with BM in recliner, RN assisted pt with standing balance and supporting L foot to maintain NWB while OT provided Max A for posterior hygiene            Extremity/Trunk Assessment Upper Extremity  Assessment Upper Extremity Assessment: Overall WFL for tasks assessed   Lower Extremity Assessment Lower Extremity Assessment: LLE deficits/detail LLE Deficits / Details: edema noted throughout LLE, L hinge break donned and locked in extension throughout   Cervical / Trunk Assessment Cervical / Trunk Assessment: Normal    Vision Patient Visual Report: No change from baseline     Perception     Praxis      Cognition Arousal/Alertness: Awake/alert   Overall Cognitive Status: No family/caregiver present to determine baseline cognitive functioning Area of Impairment: Orientation, Attention, Memory, Following commands, Safety/judgement, Awareness, Problem solving                 Orientation Level: Disoriented to, Place, Time, Situation Current Attention Level: Focused   Following Commands: Follows one step commands with increased time Safety/Judgement: Decreased awareness of deficits Awareness: Intellectual Problem Solving: Slow processing, Decreased initiation, Difficulty sequencing, Requires verbal cues, Requires tactile cues General Comments: Pt able to voice wanting more food after finishing dinner, assisted with ordering fruit according to current diet restrictions.        Exercises      Shoulder Instructions       General Comments      Pertinent Vitals/ Pain       Pain Assessment Pain Assessment: Faces Faces Pain Scale: Hurts little more Pain Location: LLE Pain Descriptors / Indicators: Discomfort, Grimacing Pain Intervention(s): Monitored during session, Repositioned  Home Living                                          Prior Functioning/Environment              Frequency  Min 2X/week        Progress Toward Goals  OT Goals(current goals can now be found in the care plan section)  Progress towards OT goals: Progressing toward goals  Acute Rehab OT Goals Patient Stated Goal: reduce pain OT Goal Formulation: With  patient Time For Goal Achievement: 03/02/22 Potential to Achieve Goals: Good  Plan Discharge plan remains appropriate;Frequency remains appropriate    Co-evaluation                 AM-PAC OT "6 Clicks" Daily Activity     Outcome Measure   Help from another person eating meals?: None Help from another person taking care of personal grooming?: A Little Help from another person toileting, which includes using toliet, bedpan, or urinal?: A Lot Help from another person bathing (including washing, rinsing, drying)?: A Lot Help from another person to put on and taking off regular upper body clothing?: A Little Help from another person to put on and taking off regular lower body clothing?: A Lot 6 Click Score: 16    End of Session Equipment Utilized During Treatment: Rolling walker (2 wheels);Gait belt  OT Visit Diagnosis: Unsteadiness on feet (R26.81);History of falling (Z91.81);Pain Pain - Right/Left: Left Pain - part of body: Leg   Activity Tolerance Patient tolerated treatment well   Patient Left in bed;with call bell/phone within reach;with bed alarm set   Nurse Communication Mobility status        Time: 7829-5621 OT Time  Calculation (min): 24 min  Charges: OT General Charges $OT Visit: 1 Visit OT Treatments $Self Care/Home Management : 23-37 mins  [facility] MS, OTR/L ascom [phone]  02/20/22, 6:11 PM

## 2022-02-21 LAB — CBC
HCT: 25.8 % — ABNORMAL LOW (ref 36.0–46.0)
Hemoglobin: 8.3 g/dL — ABNORMAL LOW (ref 12.0–15.0)
MCH: 29.5 pg (ref 26.0–34.0)
MCHC: 32.2 g/dL (ref 30.0–36.0)
MCV: 91.8 fL (ref 80.0–100.0)
Platelets: 410 10*3/uL — ABNORMAL HIGH (ref 150–400)
RBC: 2.81 MIL/uL — ABNORMAL LOW (ref 3.87–5.11)
RDW: 14.1 % (ref 11.5–15.5)
WBC: 8.6 10*3/uL (ref 4.0–10.5)
nRBC: 0 % (ref 0.0–0.2)

## 2022-02-21 LAB — GLUCOSE, CAPILLARY
Glucose-Capillary: 90 mg/dL (ref 70–99)
Glucose-Capillary: 118 mg/dL — ABNORMAL HIGH (ref 70–99)

## 2022-02-21 NOTE — Plan of Care (Signed)

## 2022-02-21 NOTE — TOC Progression Note (Signed)
Transition of Care [facility]) - Progression Note    Patient Details  Name: IRISH BREISCH MRN: [id number] Date of Birth: November 17, 1955  Transition of Care [facility]) CM/SW [place], RN Phone Number: 02/21/2022, 12:35 PM  Clinical Narrative:    Called and left a General Voice Mail with Jenny Reichmann the patient's brother and legal guardian, I spoke with her mother and she would like to have the patient go to [facility], I explained I would need to get permission from Jenny Reichmann her mother also took down my number to have John call me and review bed offers        Expected Discharge Plan and Services                                                 Social Determinants of Health (SDOH) Interventions Food Insecurity Interventions: Patient Refused Housing Interventions: Patient Refused Transportation Interventions: Patient Refused Utilities Interventions: Patient Refused  Readmission Risk Interventions     No data to display

## 2022-02-21 NOTE — Progress Notes (Signed)
Physical Therapy Treatment Patient Details Name: Carly Collins MRN: [id number] DOB: [birth year] Today's Date: 02/21/2022   History of Present Illness Pt is a 66 y/o F admitted on 02/13/22 after presenting following fall & confusion. Pt  found to have positive urinalysis as well as minimally displaced L lower femoral fx.  Pt underwent L ORIF 02/15/22 by Dr. Roland Rack & is now NWB LLE. PMH: significant cognitive impairment, HTN, HLD, diet controlled diabetes, GERD, depression with anxiety, recurrent UTI, c diff colitis, iron deficiency anemia, breast CA    PT Comments    Initially planned to focus on w/c mobility/training this session, however pt found to be incontinent in chair. Therefore, session focused on transfer training and multiple sit<>stand transfers with static standing at RW for 10 second intervals. Pt with decreased upper body strength to maintain standing at RW NWB L LE. Squat pivot transfers without AD with MaxA.  Will re-attempt to see pt for w/c training next visit.   Recommendations for follow up therapy are one component of a multi-disciplinary discharge planning process, led by the attending physician.  Recommendations may be updated based on patient status, additional functional criteria and insurance authorization.  Follow Up Recommendations  Skilled nursing-short term rehab (<3 hours/day) Can patient physically be transported by private vehicle: No   Assistance Recommended at Discharge Frequent or constant Supervision/Assistance  Patient can return home with the following Two people to help with walking and/or transfers;A little help with bathing/dressing/bathroom;Help with stairs or ramp for entrance   Equipment Recommendations  Rolling walker (2 wheels);Wheelchair cushion (measurements PT);Wheelchair (measurements PT)    Recommendations for Other Services OT consult     Precautions / Restrictions Precautions Precautions: Fall Precaution Comments: LLE hinged knee brace  locked in extension, can remove to perform gentle ROM with therapy Restrictions Weight Bearing Restrictions: Yes LLE Weight Bearing: Non weight bearing     Mobility  Bed Mobility                    Transfers Overall transfer level: Needs assistance Equipment used: Rolling walker (2 wheels) Transfers: Sit to/from Stand, Bed to chair/wheelchair/BSC Sit to Stand: Mod assist, Max assist Stand pivot transfers: Mod assist, Max assist         General transfer comment: poor ability to maintain NWB LLE    Ambulation/Gait               General Gait Details: no true gait but pivots on RLE   Stairs             Wheelchair Mobility    Modified Rankin (Stroke Patients Only)       Balance                                            Cognition Arousal/Alertness: Awake/alert Behavior During Therapy: WFL for tasks assessed/performed Overall Cognitive Status: No family/caregiver present to determine baseline cognitive functioning Area of Impairment: Orientation, Attention, Memory, Following commands, Safety/judgement, Awareness, Problem solving                 Orientation Level: Disoriented to, Place, Time, Situation Current Attention Level: Focused   Following Commands: Follows one step commands with increased time Safety/Judgement: Decreased awareness of deficits Awareness: Intellectual Problem Solving: Slow processing, Decreased initiation, Difficulty sequencing, Requires verbal cues, Requires tactile cues  Exercises      General Comments General comments (skin integrity, edema, etc.):  (Pt incontinent of stool and urine, increased time spent for hygiene and multiple sit<>stand transfers)      Pertinent Vitals/Pain Pain Assessment Pain Assessment: Faces Faces Pain Scale: Hurts even more Negative Vocalization: occasional moan/groan, low speech, negative/disapproving quality Facial Expression: facial grimacing Body  Language: tense, distressed pacing, fidgeting Pain Location: LLE Pain Descriptors / Indicators: Discomfort, Grimacing Pain Intervention(s): Limited activity within patient's tolerance, Monitored during session, Patient requesting pain meds-RN notified, Repositioned    Home Living                          Prior Function            PT Goals (current goals can now be found in the care plan section) Acute Rehab PT Goals Patient Stated Goal: feel better    Frequency    7X/week      PT Plan Current plan remains appropriate    Co-evaluation              AM-PAC PT "6 Clicks" Mobility   Outcome Measure  Help needed turning from your back to your side while in a flat bed without using bedrails?: A Little Help needed moving from lying on your back to sitting on the side of a flat bed without using bedrails?: A Little Help needed moving to and from a bed to a chair (including a wheelchair)?: A Lot Help needed standing up from a chair using your arms (e.g., wheelchair or bedside chair)?: A Little Help needed to walk in hospital room?: Total Help needed climbing 3-5 steps with a railing? : Total 6 Click Score: 13    End of Session Equipment Utilized During Treatment: Gait belt Activity Tolerance: Patient tolerated treatment well;Other (comment) Patient left: in chair;with chair alarm set;with call bell/phone within reach Nurse Communication: Mobility status PT Visit Diagnosis: Unsteadiness on feet (R26.81);Muscle weakness (generalized) (M62.81);Difficulty in walking, not elsewhere classified (R26.2);Other abnormalities of gait and mobility (R26.89)     Time: 6629-4765 PT Time Calculation (min) (ACUTE ONLY): 43 min  Charges:  $Therapeutic Activity: 38-52 mins                    Mikel Cella, PTA    [name] 02/21/2022, 2:05 PM

## 2022-02-21 NOTE — Care Management Important Message (Signed)
Important Message  Patient Details  Name: Carly Collins MRN: [id number] Date of Birth: [birth year]   Medicare Important Message Given:  Other (see comment)  Left a voice message for the patient's legal guardian, Carly Collins, brother at [phone] to review the Important Message from [facility]. Will await a return call.   [name] 02/21/2022, 2:42 PM

## 2022-02-21 NOTE — Progress Notes (Signed)
Progress Note   Patient: Carly Collins:[id number] DOB: 09/07/1955 DOA: 02/13/2022     7 DOS: the patient was seen and examined on 02/21/2022   Brief hospital course: Taken from H&P.  Carly Collins is a 66 y.o. female with medical history significant of cognitive impairment, hypertension, hyperlipidemia, diet-controlled diabetes, GERD, depression with anxiety, recurrent UTI, C. difficile colitis, iron deficiency anemia, breast cancer, who presents with fall and confusion.   Patient has history of cognitive impairment, cannot provide accurate medical history.  Unable to reach any family member so history was obtained from chart review.Per report, patient had positive COVID test 1 week ago. She has mild dry cough, does not seem to have shortness of breath and chest pain.  Today her COVID test is negative.  Patient was found to be more confused than her baseline in facility.  She fell twice in facility. Unclear if she hit her head or had loss of consciousness. Patient has dysuria and suprapubic abdominal pain.  Data reviewed independently and ED Course: pt was found to have positive urinalysis (hazy appearance, large amount of leukocyte, positive nitrite, rare bacteria, WBC> 50), negative COVID PCR, WBC 6.6, GFR> 60, temperature normal, blood pressure 110/88, heart rate 58, RR 19, oxygen saturation 98% on room air.  Chest x-ray negative.  CT head is negative.   10/5: Patient was complaining of left leg pain and unable to bear weight.  Her left leg seems swollen.  Imaging was ordered and found to have minimally displaced left lower femoral fracture. Orthopedic was consulted. Preliminary urine cultures with E. coli pending susceptibility. 10/6: Hemodynamically stable.  Orthopedic surgery is recommending surgery to stabilize her fracture.  Message sent to her nephew who is also her POA for concent. Patient will be going to the OR later today.  10/7: S/p ORIF yesterday.  Patient will be  nonweightbearing on left lower extremity.  Urine cultures with pansensitive E. Coli. Hemoglobin dropped to 6.8 from 8 postoperatively-ordered 1 unit of PRBC.  PT and OT are recommending SNF. TOC consult for placement.  10/8: Patient remained stable.  Hemoglobin improved to 9.  Restarting Lovenox as she is high risk for DVT. Patient will remain nonweightbearing on the left lower extremity per orthopedic surgery.  10/9: Hemodynamically stable, appears pale so CBC was rechecked and hemoglobin at 8.4.  Increasing the dose of ferrous sulfate to twice daily.  PT and OT are recommending SNF.  Patient will remain nonweightbearing on left lower extremity and will follow-up with orthopedic surgery as an outpatient in 2 weeks. Talk with mother on phone who herself is 37 years old.  She was very upset with [place] that she fell in the hospital and broke her leg.  I tried explaining multiple times that she fell at the group home but she was keeps repeating that group home called her and told her that she fell in the hospital not at group home?? Patient was admitted from group home with a complaint of fall and altered mental status.  10/10: Patient remained stable.  Working with PT.  Carly Collins is working on SNF placement.  Hemoglobin seems stable around 8.3.  10/11: Remains stable.  Still no bed offers.  10/12: Remains stable, awaiting SNF bed.   Assessment and Plan: * UTI (urinary tract infection) Urine cultures with pansensitive E. coli Patient has recurrent UTI.  No sepsis. Completed a course of antibiotics.  Femoral distal fracture (HCC) S/p ORIF on 02/15/2022. Most likely secondary to fall.  Imaging with  concern of minimally displaced left distal femoral fracture. Patient will be NWB on left lower extremity. -PT/OT are recommending SNF -Continue with pain management -Outpatient orthopedic follow-up in 2 weeks  Acute metabolic encephalopathy Improved.  Seems to be at baseline now Per report, patient is  more confused than baseline.  Patient has history of cognitive impairment. Not sure about baseline mental status.  CT head negative.  Likely due to UTI. Patient was able to appropriately converse with me today   Hypertension Blood pressure within goal.  Pt is not taking meds now - IV hydralazine as needed  Breast cancer (HCC) -continue letrozole  Anxiety and depression - Continue home medications:  IDA (iron deficiency anemia) Hemoglobin 8.7>>8.1>>6.8>>9.0>>8.4>>8.3  Received 1 unit of PRBC -Continue to monitor - Continue iron supplement-dose increased to twice daily  Intertriginous dermatitis associated with moisture - Topical nystatin powder  Cognitive deficits Continue donepezil, Namenda  COVID-19 virus infection Patient has mild dry cough, no fever.  Oxygen saturation 98% on room air.  Chest x-ray negative.  Patient was started on molnupiravir on 10/3 in facility.  Today COVID test is negative. -Will not continue molnupiravir -As needed albuterol and Mucinex   Subjective: Patient was seen and examined today. Sitting in chair states pain fairly well controlled.  No new complaints.  Physical Exam: Vitals:   02/20/22 0750 02/20/22 1535 02/21/22 0021 02/21/22 0850  BP: 102/67 122/76 (!) 110/56 125/77  Pulse: 71 89 72 70  Resp: '16 16 17 16  '$ Temp: 98.2 F (36.8 C) 98 F (36.7 C) 98.2 F (36.8 C) (!) 97.3 F (36.3 C)  TempSrc:      SpO2: 94% 98% 97% 97%  Weight:      Height:       General.     In no acute distress. Pulmonary.  Lungs clear bilaterally, normal respiratory effort. CV.  Regular rate and rhythm, no JVD, rub or murmur. Abdomen.  Soft, nontender, nondistended, BS positive. CNS.  Alert and oriented .  No focal neurologic deficit. Extremities.  No edema, no cyanosis, pulses intact and symmetrical. Psychiatry.  Judgment and insight appears impaired.  Data Reviewed: Prior data reviewed  Family Communication: Called brother and left a Technical sales engineer.  Disposition: Status is: Inpatient Remains inpatient appropriate because: Severity of illness   Planned Discharge Destination: Skilled nursing facility  DVT prophylaxis. Lovenox Time spent: 38 minutes  This record has been created using Systems analyst. Errors have been sought and corrected,but may not always be located. Such creation errors do not reflect on the standard of care.  Author: Carlyle Lipa, MD 02/21/2022 2:18 PM  For on call review [email or web address].

## 2022-02-21 NOTE — Progress Notes (Signed)
Subjective: 6 Days Post-Op Procedure(s) (LRB): OPEN REDUCTION INTERNAL FIXATION (ORIF) DISTAL FEMUR FRACTURE (Left) Patient reports pain as mild.  Patient is well, and has had no acute complaints or problems Denies any CP, SOB, ABD pain. We will continue therapy today.  Patient has had a BM since surgery.  Objective: Vital signs in last 24 hours: Temp:  [98 F (36.7 C)-98.2 F (36.8 C)] 98.2 F (36.8 C) (10/12 0021) Pulse Rate:  [71-89] 72 (10/12 0021) Resp:  [16-17] 17 (10/12 0021) BP: (102-122)/(56-76) 110/56 (10/12 0021) SpO2:  [94 %-98 %] 97 % (10/12 0021)  Intake/Output from previous day: 10/11 0701 - 10/12 0700 In: 480 [P.O.:480] Out: 1700 [Urine:1700] Intake/Output this shift: No intake/output data recorded.  Recent Labs    02/18/22 1033 02/19/22 0528 02/21/22 0549  HGB 8.4* 8.3* 8.3*   Recent Labs    02/19/22 0528 02/21/22 0549  WBC 9.0 8.6  RBC 2.77* 2.81*  HCT 25.1* 25.8*  PLT 288 410*   No results for input(s): "NA", "K", "CL", "CO2", "BUN", "CREATININE", "GLUCOSE", "CALCIUM" in the last 72 hours.  No results for input(s): "LABPT", "INR" in the last 72 hours.  EXAM General - Patient is Alert and Appropriate Extremity - Neurovascular intact Sensation intact distally Intact pulses distally Dorsiflexion/Plantar flexion intact No cellulitis present Compartment soft Knee ROM brace intact Dressing - dressing C/D/I and no drainage Motor Function - intact, moving foot and toes well on exam.   Past Medical History:  Diagnosis Date   Anxiety    C. difficile enteritis    Cancer ([place])    Depression    Diabetes mellitus without complication ([place])    GERD (gastroesophageal reflux disease)    Headache    Hyperlipidemia    Hypertension    Mental retardation, mild (I.Q. 50-70)    Moderate intellectual disabilities    Obesity    Recurrent UTI    Tibial fracture    Vitamin D deficiency     Assessment/Plan:   6 Days Post-Op Procedure(s)  (LRB): OPEN REDUCTION INTERNAL FIXATION (ORIF) DISTAL FEMUR FRACTURE (Left) Principal Problem:   UTI (urinary tract infection) Active Problems:   Anxiety and depression   Breast cancer (HCC)   Cognitive deficits   IDA (iron deficiency anemia)   Acute metabolic encephalopathy   Hypertension   Intertriginous dermatitis associated with moisture   COVID-19 virus infection   Femoral distal fracture (HCC)  Estimated body mass index is 24.6 kg/m as calculated from the following:   Height as of this encounter: '5\' 2"'$  (1.575 m).   Weight as of this encounter: 61 kg. Advance diet Up with therapy, NWB LLE  Pain controlled VSS Hg 8.3 this morning.  Continue Lovenox for DVT prevention. CM to assist with discharge to SNF  Follow up with [facility] ortho in 2 weeks.  Upon discharge, continue Lovenox '40mg'$  daily for DVT prophylaxis. NWB LLE  DVT Prophylaxis - Lovenox, TED hose, and SCDs Non-Weight bearing Left lower extremity  J. Cameron Proud, PA-C [place] 02/21/2022, 7:49 AM

## 2022-02-22 ENCOUNTER — Encounter: Payer: Self-pay | Admitting: Surgery

## 2022-02-22 DIAGNOSIS — N3000 Acute cystitis without hematuria: Secondary | ICD-10-CM | POA: Diagnosis not present

## 2022-02-22 LAB — GLUCOSE, CAPILLARY: Glucose-Capillary: 135 mg/dL — ABNORMAL HIGH (ref 70–99)

## 2022-02-22 MED ORDER — FERROUS GLUCONATE 324 (38 FE) MG PO TABS: 324.0000 mg | ORAL_TABLET | Freq: Two times a day (BID) | ORAL | 3 refills | Status: AC

## 2022-02-22 NOTE — Progress Notes (Signed)
Occupational Therapy Treatment Patient Details Name: Carly Collins MRN: [id number] DOB: 14-Sep-1955 Today's Date: 02/22/2022   History of present illness Pt is a 66 y/o F admitted on 02/13/22 after presenting following fall & confusion. Pt  found to have positive urinalysis as well as minimally displaced L lower femoral fx.  Pt underwent L ORIF 02/15/22 by Dr. Roland Rack & is now NWB LLE. PMH: significant cognitive impairment, HTN, HLD, diet controlled diabetes, GERD, depression with anxiety, recurrent UTI, c diff colitis, iron deficiency anemia, breast CA   OT comments  Chart reviewed, pt greeted in chair, requiring change of KI as current KI is covered in urine. Multiple STS completed with MIN A with RW, new KI adjusted. Pt continues to follow commands inconsistently with increased time. OT will continue to follow acutely.    Recommendations for follow up therapy are one component of a multi-disciplinary discharge planning process, led by the attending physician.  Recommendations may be updated based on patient status, additional functional criteria and insurance authorization.    Follow Up Recommendations  Pt would benefit from discharge home with [facility] provided staff can provide MIN A for wc level transfers, if not, recommend Skilled nursing-short term rehab (<3 hours/day)    Assistance Recommended at Discharge Frequent or constant Supervision/Assistance  Patient can return home with the following  A little help with walking and/or transfers;A little help with bathing/dressing/bathroom   Equipment Recommendations  BSC/3in1;Tub/shower seat;Wheelchair (measurements OT)    Recommendations for Other Services      Precautions / Restrictions Precautions Precautions: Fall Precaution Comments: LLE hinged knee brace locked in extension, can remove to perform gentle ROM with therapy Restrictions Weight Bearing Restrictions: Yes LLE Weight Bearing: Non weight bearing       Mobility Bed  Mobility               General bed mobility comments: NT in recliner pre/post session    Transfers Overall transfer level: Needs assistance Equipment used: Rolling walker (2 wheels) Transfers: Sit to/from Stand Sit to Stand: Min assist (approx 5 attempts)                 Balance Overall balance assessment: Needs assistance Sitting-balance support: Feet supported, Bilateral upper extremity supported Sitting balance-Leahy Scale: Good     Standing balance support: Bilateral upper extremity supported, During functional activity Standing balance-Leahy Scale: Fair                             ADL either performed or assessed with clinical judgement   ADL Overall ADL's : Needs assistance/impaired Eating/Feeding: Set up;Sitting                   Lower Body Dressing: Maximal assistance;Sit to/from stand   Toilet Transfer: Minimal assistance   Toileting- Clothing Manipulation and Hygiene: Maximal assistance;+2 for physical assistance              Extremity/Trunk Assessment              Vision       Perception     Praxis      Cognition Arousal/Alertness: Awake/alert Behavior During Therapy: WFL for tasks assessed/performed Overall Cognitive Status: No family/caregiver present to determine baseline cognitive functioning Area of Impairment: Orientation, Attention, Memory, Following commands, Safety/judgement, Awareness, Problem solving                 Orientation Level: Disoriented to, Place, Time, Situation Current  Attention Level: Focused   Following Commands: Follows one step commands with increased time, follows one step commands inconsistently Safety/Judgement: Decreased awareness of deficits Awareness: Intellectual Problem Solving: Slow processing, Decreased initiation, Difficulty sequencing, Requires verbal cues, Requires tactile cues General Comments: baseline cognitive deficits        Exercises      Shoulder  Instructions       General Comments adjusted new KI as previous KI was soiled in urine    Pertinent Vitals/ Pain       Pain Assessment Pain Assessment: Faces Faces Pain Scale: Hurts little more Pain Location: LLE Pain Descriptors / Indicators: Discomfort, Grimacing Pain Intervention(s): Monitored during session, Repositioned  Home Living                                          Prior Functioning/Environment              Frequency  Min 2X/week        Progress Toward Goals  OT Goals(current goals can now be found in the care plan section)  Progress towards OT goals: Progressing toward goals     Plan Discharge plan remains appropriate;Frequency remains appropriate    Co-evaluation                 AM-PAC OT "6 Clicks" Daily Activity     Outcome Measure   Help from another person eating meals?: None Help from another person taking care of personal grooming?: A Little Help from another person toileting, which includes using toliet, bedpan, or urinal?: A Lot Help from another person bathing (including washing, rinsing, drying)?: A Lot Help from another person to put on and taking off regular upper body clothing?: A Little Help from another person to put on and taking off regular lower body clothing?: A Lot 6 Click Score: 16    End of Session Equipment Utilized During Treatment: Rolling walker (2 wheels);Gait belt  OT Visit Diagnosis: Unsteadiness on feet (R26.81);History of falling (Z91.81);Pain   Activity Tolerance Patient tolerated treatment well   Patient Left in chair;with call bell/phone within reach;with chair alarm set   Nurse Communication Mobility status        Time: 4098-1191 OT Time Calculation (min): 20 min  Charges: OT General Charges $OT Visit: 1 Visit OT Treatments $Therapeutic Activity: 8-22 mins  Shanon Payor, OTD OTR/L  02/22/22, 1:36 PM

## 2022-02-22 NOTE — TOC Progression Note (Addendum)
Transition of Care [facility]) - Progression Note    Patient Details  Name: Carly Collins MRN: [id number] Date of Birth: 11-30-1955  [facility]) CM/SW [place], RN Phone Number: 02/22/2022, 12:19 PM  Clinical Narrative:    Spoke to the patient's brother Jenny Reichmann, He provided permission for her to return to [place] home today, I explained I would need approval from Merlene Morse as well, I attempted to reach Butch Penny at [employer] at  [phone], oleft a vm asking for a call back, Jenny Reichmann  will call me back with a number to call  Update Jenny Reichmann called and stated that the Merlene Morse will pick up , he provided a number to call but it was not accurate, he will call back with a new number   Called Merlene Morse at [phone] ext [phone], left a VM for a call back   Tawanna Solo at [employer]  they said they can not take her back due to her needing HH and can not go to Outpatient PT at this time she can not take her back with her needs She can not have a wheelchair due to ratios and fire codes She will need to go to [facility] SNF They agree that she will do better in her familiar environment but they cannot take her  She has to be able to walk with her rollator  To go back to [place]   She will go to [facility] today to room 3 B  Notified [place]     Expected Discharge Plan and Services           Expected Discharge Date: 02/22/22                                     Social Determinants of Health (SDOH) Interventions Food Insecurity Interventions: Patient Refused Housing Interventions: Patient Refused Transportation Interventions: Patient Refused Utilities Interventions: Patient Refused  Readmission Risk Interventions     No data to display

## 2022-02-22 NOTE — Plan of Care (Signed)

## 2022-02-22 NOTE — TOC Progression Note (Signed)
Transition of Care [facility]) - Progression Note    Patient Details  Name: Carly Collins MRN: [id number] Date of Birth: 1955/12/03  Transition of Care [facility]) CM/SW [place], RN Phone Number: 02/22/2022, 9:40 AM  Clinical Narrative:    Marzella Schlein the legal guardian and brother, we reviewed the bed offers, he is agreeable to [facility]        Expected Discharge Plan and Services                                                 Social Determinants of Health (SDOH) Interventions Food Insecurity Interventions: Patient Refused Housing Interventions: Patient Refused Transportation Interventions: Patient Refused Utilities Interventions: Patient Refused  Readmission Risk Interventions     No data to display

## 2022-02-22 NOTE — Progress Notes (Signed)
Report called to [facility].

## 2022-02-22 NOTE — Progress Notes (Addendum)
Physical Therapy Treatment Patient Details Name: Carly Collins MRN: [id number] DOB: March 13, 1956 Today's Date: 02/22/2022   History of Present Illness Pt is a 66 y/o F admitted on 02/13/22 after presenting following fall & confusion. Pt  found to have positive urinalysis as well as minimally displaced L lower femoral fx.  Pt underwent L ORIF 02/15/22 by Dr. Roland Rack & is now NWB LLE. PMH: significant cognitive impairment, HTN, HLD, diet controlled diabetes, GERD, depression with anxiety, recurrent UTI, c diff colitis, iron deficiency anemia, breast CA    PT Comments    Pt seen for PT tx with pt received in bed & agreeable to tx. Pt observed to be in bed with all linens saturated in urine & pt's gown off upper body. Provided pt with clean gown & assisted her with donning it. Pt is able to complete supine>sit with mod I with HOB elevated & bed rails. Pt completes lateral scoot bed>drop arm recliner on R with supervision & max cuing for safety/technique. Pt engaged in LLE strengthening exercises. Pt's pads on L knee brace noted to be soiled with urine - obtained new/clean brace for pt & provided to OT who was entering room to don it. PT also adjusted positioning of current L knee brace.  At this time, pt requires only supervision for bed>chair transfers via lateral scoot. Pt could d/c home with HHPT f/u & complete bed<>w/c transfers with supervision & remain w/c level until MD updates LLE weight bearing restrictions. Notified MD & case manager of updated d/c recommendations.  Addendum: Per case manager, pt cannot d/c to her home/facility if she is unable to walk (per their fire code, pt cannot have a w/c). Due to this, pt would benefit from STR upon d/c to maximize independence with transfers & gait while maintaining NWB LLE.     Recommendations for follow up therapy are one component of a multi-disciplinary discharge planning process, led by the attending physician.  Recommendations may be updated based on  patient status, additional functional criteria and insurance authorization.  Follow Up Recommendations  SNF   Can patient physically be transported by private vehicle: No   Assistance Recommended at Discharge Frequent or constant Supervision/Assistance  Patient can return home with the following A little help with walking and/or transfers;A little help with bathing/dressing/bathroom;Help with stairs or ramp for entrance   Equipment Recommendations  Rolling walker (2 wheels);Wheelchair cushion (measurements PT);Wheelchair WITH REMOVABLE ARMRESTS (measurements PT);BSC/3in1    Recommendations for Other Services       Precautions / Restrictions Precautions Precautions: Fall Precaution Comments: LLE hinged knee brace locked in extension, can remove to perform gentle ROM with therapy Restrictions Weight Bearing Restrictions: Yes LLE Weight Bearing: Non weight bearing     Mobility  Bed Mobility Overal bed mobility: Modified Independent             General bed mobility comments: supine>long sitting    Transfers Overall transfer level: Needs assistance   Transfers: Bed to chair/wheelchair/BSC            Lateral/Scoot Transfers: Supervision General transfer comment: pt rests L foot on floor but no active pushing through extremity    Ambulation/Gait                   Stairs             Wheelchair Mobility    Modified Rankin (Stroke Patients Only)       Balance Overall balance assessment: Needs assistance Sitting-balance support: Feet  supported, Bilateral upper extremity supported Sitting balance-Leahy Scale: Good Sitting balance - Comments: scooting to drop arm recliner with supervision                                    Cognition Arousal/Alertness: Awake/alert Behavior During Therapy: WFL for tasks assessed/performed Overall Cognitive Status: No family/caregiver present to determine baseline cognitive functioning                                  General Comments: Pt with hx of baseline cognitive deficits, does follow simple instructions throughout session, decreased safety awareness requiring cuing/supervision. Able to participate in counting task during session. Unaware of incontinent void & bed sheets saturated in urine.        Exercises General Exercises - Lower Extremity Hip ABduction/ADduction: AAROM, Strengthening, Left, 10 reps Straight Leg Raises: AAROM, Strengthening, Left, 10 reps    General Comments        Pertinent Vitals/Pain Pain Assessment Pain Assessment: Faces Faces Pain Scale: Hurts little more Pain Location: LLE Pain Descriptors / Indicators: Discomfort, Grimacing Pain Intervention(s): Monitored during session (notified nurse who reports pt is premedicated)    Home Living                          Prior Function            PT Goals (current goals can now be found in the care plan section) Acute Rehab PT Goals Patient Stated Goal: decreased pain PT Goal Formulation: With patient Time For Goal Achievement: 03/02/22 Potential to Achieve Goals: Fair Progress towards PT goals: Progressing toward goals    Frequency    7X/week      PT Plan Discharge plan needs to be updated    Co-evaluation              AM-PAC PT "6 Clicks" Mobility   Outcome Measure  Help needed turning from your back to your side while in a flat bed without using bedrails?: None Help needed moving from lying on your back to sitting on the side of a flat bed without using bedrails?: None Help needed moving to and from a bed to a chair (including a wheelchair)?: A Little Help needed standing up from a chair using your arms (e.g., wheelchair or bedside chair)?: A Little Help needed to walk in hospital room?: Total Help needed climbing 3-5 steps with a railing? : Total 6 Click Score: 16    End of Session Equipment Utilized During Treatment:  (LLE brace) Activity  Tolerance: Patient tolerated treatment well Patient left: in chair;with chair alarm set;with call bell/phone within reach Nurse Communication: Mobility status (c/o pain) PT Visit Diagnosis: Unsteadiness on feet (R26.81);Muscle weakness (generalized) (M62.81);Difficulty in walking, not elsewhere classified (R26.2);Other abnormalities of gait and mobility (R26.89)     Time: 8502-7741 PT Time Calculation (min) (ACUTE ONLY): 13 min  Charges:  $Therapeutic Activity: 8-22 mins                     Lavone Nian, PT, DPT 02/22/22, 1:21 PM   Waunita Schooner 02/22/2022, 11:50 AM

## 2022-02-22 NOTE — Progress Notes (Signed)
Subjective: 7 Days Post-Op Procedure(s) (LRB): OPEN REDUCTION INTERNAL FIXATION (ORIF) DISTAL FEMUR FRACTURE (Left) Patient reports pain as mild. Patient is sleeping well when I enter the room this morning. Patient is well, and has had no acute complaints or problems Denies any CP, SOB, ABD pain. We will continue therapy today.  Patient has had a BM since surgery.  Objective: Vital signs in last 24 hours: Temp:  [97.3 F (36.3 C)-98.6 F (37 C)] 98.6 F (37 C) (10/13 0628) Pulse Rate:  [68-77] 68 (10/13 0628) Resp:  [16-17] 16 (10/13 0628) BP: (123-140)/(63-77) 123/63 (10/13 0628) SpO2:  [97 %-98 %] 98 % (10/13 0628)  Intake/Output from previous day: 10/12 0701 - 10/13 0700 In: 860 [P.O.:860] Out: 450 [Urine:450] Intake/Output this shift: No intake/output data recorded.  Recent Labs    02/21/22 0549  HGB 8.3*   Recent Labs    02/21/22 0549  WBC 8.6  RBC 2.81*  HCT 25.8*  PLT 410*   No results for input(s): "NA", "K", "CL", "CO2", "BUN", "CREATININE", "GLUCOSE", "CALCIUM" in the last 72 hours.  No results for input(s): "LABPT", "INR" in the last 72 hours.  EXAM General - Patient is Alert and Appropriate Extremity - Neurovascular intact Sensation intact distally Intact pulses distally Dorsiflexion/Plantar flexion intact No cellulitis present Compartment soft Knee ROM brace intact to the left leg this morning. Dressing - dressing C/D/I and no drainage noted to the left leg honeycomb dressing site. Motor Function - intact, moving foot and toes well on exam.  Abdomen soft with intact bowel sounds this morning. Negative Homans to bilateral lower extremities.  Past Medical History:  Diagnosis Date   Anxiety    C. difficile enteritis    Cancer ([place])    Depression    Diabetes mellitus without complication (HCC)    GERD (gastroesophageal reflux disease)    Headache    Hyperlipidemia    Hypertension    Mental retardation, mild (I.Q. 50-70)    Moderate  intellectual disabilities    Obesity    Recurrent UTI    Tibial fracture    Vitamin D deficiency     Assessment/Plan:   7 Days Post-Op Procedure(s) (LRB): OPEN REDUCTION INTERNAL FIXATION (ORIF) DISTAL FEMUR FRACTURE (Left) Principal Problem:   UTI (urinary tract infection) Active Problems:   Anxiety and depression   Breast cancer (HCC)   Cognitive deficits   IDA (iron deficiency anemia)   Acute metabolic encephalopathy   Hypertension   Intertriginous dermatitis associated with moisture   COVID-19 virus infection   Femoral distal fracture (HCC)  Estimated body mass index is 24.6 kg/m as calculated from the following:   Height as of this encounter: '5\' 2"'$  (1.575 m).   Weight as of this encounter: 61 kg. Advance diet Up with therapy, NWB LLE  Pain controlled this morning. VSS Hg 8.3 this morning on 02/22/22, no active bleeding noted to the left leg. Continue Lovenox for DVT prevention upon discharge for 14 days. CM to assist with discharge to SNF, patient waiting on rehab bed at this time.  Follow up with [place] [facility] in 2 weeks.  Upon discharge, continue Lovenox '40mg'$  daily for DVT prophylaxis for 14 days. NWB LLE  DVT Prophylaxis - Lovenox, TED hose, and SCDs Non-Weight bearing Left lower extremity  J. Cameron Proud, PA-C [place] 02/22/2022, 7:40 AM

## 2022-02-22 NOTE — Discharge Summary (Signed)
Physician Discharge Summary   Patient: Carly Collins MRN: [id number] DOB: 12/04/1955  Admit date:     02/13/2022  Discharge date: 02/22/22  Discharge Physician: Lorella Nimrod   PCP: Janace Litten., MD   Recommendations at discharge:  Please obtain CBC and BMP in 1 week. Patient will be nonweightbearing on left lower extremity. Continue using knee stabilizer. She will continue with Lovenox for 2 weeks for DVT prophylaxis. Follow-up with orthopedic surgery in 2 weeks. Follow-up with primary care provider  Discharge Diagnoses: Principal Problem:   UTI (urinary tract infection) Active Problems:   Femoral distal fracture (HCC)   Acute metabolic encephalopathy   Hypertension   Breast cancer (HCC)   Anxiety and depression   IDA (iron deficiency anemia)   Intertriginous dermatitis associated with moisture   Cognitive deficits   COVID-19 virus infection   Hospital Course: Taken from H&P.  Carly Collins is a 66 y.o. female with medical history significant of cognitive impairment, hypertension, hyperlipidemia, diet-controlled diabetes, GERD, depression with anxiety, recurrent UTI, C. difficile colitis, iron deficiency anemia, breast cancer, who presents with fall and confusion.   Patient has history of cognitive impairment, cannot provide accurate medical history.  Unable to reach any family member so history was obtained from chart review.Per report, patient had positive COVID test 1 week ago. She has mild dry cough, does not seem to have shortness of breath and chest pain.  Today her COVID test is negative.  Patient was found to be more confused than her baseline in facility.  She fell twice in facility. Unclear if she hit her head or had loss of consciousness. Patient has dysuria and suprapubic abdominal pain.  Data reviewed independently and ED Course: pt was found to have positive urinalysis (hazy appearance, large amount of leukocyte, positive nitrite, rare bacteria, WBC> 50),  negative COVID PCR, WBC 6.6, GFR> 60, temperature normal, blood pressure 110/88, heart rate 58, RR 19, oxygen saturation 98% on room air.  Chest x-ray negative.  CT head is negative.   10/5: Patient was complaining of left leg pain and unable to bear weight.  Her left leg seems swollen.  Imaging was ordered and found to have minimally displaced left lower femoral fracture. Orthopedic was consulted. Preliminary urine cultures with E. coli pending susceptibility. 10/6: Hemodynamically stable.  Orthopedic surgery is recommending surgery to stabilize her fracture.  Message sent to her nephew who is also her POA for concent. Patient will be going to the OR later today.  10/7: S/p ORIF yesterday.  Patient will be nonweightbearing on left lower extremity.  Urine cultures with pansensitive E. Coli. Hemoglobin dropped to 6.8 from 8 postoperatively-ordered 1 unit of PRBC.  PT and OT are recommending SNF. TOC consult for placement.  10/8: Patient remained stable.  Hemoglobin improved to 9.  Restarting Lovenox as she is high risk for DVT. Patient will remain nonweightbearing on the left lower extremity per orthopedic surgery.  10/9: Hemodynamically stable, appears pale so CBC was rechecked and hemoglobin at 8.4.  Increasing the dose of ferrous sulfate to twice daily.  PT and OT are recommending SNF.  Patient will remain nonweightbearing on left lower extremity and will follow-up with orthopedic surgery as an outpatient in 2 weeks. Talk with mother on phone who herself is 31 years old.  She was very upset with [place] that she fell in the hospital and broke her leg.  I tried explaining multiple times that she fell at the group home but she was keeps  repeating that group home called her and told her that she fell in the hospital not at group home?? Patient was admitted from group home with a complaint of fall and altered mental status.  10/10: Patient remained stable.  Working with PT.  Carly Collins is working on SNF  placement.  Hemoglobin seems stable around 8.3.  10/11: Remains stable.  Still no bed offers.  10/13: Patient remained stable.  She is being discharged to rehab for further management before returning back to her group home.  She will remain nonweightbearing on left lower extremity.  She will continue Lovenox 40 mg daily for DVT prophylaxis for 14 days and follow-up with can order clinic orthopedic surgery in 2 weeks for further recommendations.  Patient will continue on her current management and will follow-up with her providers.  Assessment and Plan: * UTI (urinary tract infection) Urine cultures with pansensitive E. coli Patient has recurrent UTI.  No sepsis. Completed a course of antibiotics.  Femoral distal fracture (HCC) S/p ORIF on 02/15/2022. Most likely secondary to fall.  Imaging with concern of minimally displaced left distal femoral fracture. Patient will be NWB on left lower extremity. -PT/OT are recommending SNF -Continue with pain management -Outpatient orthopedic follow-up in 2 weeks  Acute metabolic encephalopathy Improved.  Seems to be at baseline now Per report, patient is more confused than baseline.  Patient has history of cognitive impairment. Not sure about baseline mental status.  CT head negative.  Likely due to UTI   Hypertension Blood pressure within goal.  Pt is not taking meds now - IV hydralazine as needed  Breast cancer (HCC) -continue letrozole  Anxiety and depression - Continue home medications:  IDA (iron deficiency anemia) Hemoglobin 8.7>>8.1>>6.8>>9.0>>8.4>>8.3  Received 1 unit of PRBC -Continue to monitor - Continue iron supplement-dose increased to twice daily  Intertriginous dermatitis associated with moisture - Topical nystatin powder  Cognitive deficits Continue donepezil, Namenda  COVID-19 virus infection Patient has mild dry cough, no fever.  Oxygen saturation 98% on room air.  Chest x-ray negative.  Patient was started on  molnupiravir on 10/3 in facility.  Today COVID test is negative. -Will not continue molnupiravir -As needed albuterol and Mucinex   Consultants: Orthopedic surgery Procedures performed: ORIF Disposition: Skilled nursing facility Diet recommendation:  Discharge Diet Orders (From admission, onward)     Start     Ordered   02/22/22 0000  Diet - low sodium heart healthy        02/22/22 1049           Cardiac and Carb modified diet DISCHARGE MEDICATION: Allergies as of 02/22/2022       Reactions   Pioglitazone Other (See Comments)   unknown Unknown to patient        Medication List     STOP taking these medications    gabapentin 300 MG capsule Commonly known as: NEURONTIN   molnupiravir EUA 200 mg Caps capsule Commonly known as: LAGEVRIO       TAKE these medications    alendronate 70 MG tablet Commonly known as: FOSAMAX Take 70 mg by mouth every Saturday.   Biotene Dry Mouth Pste Place 1 Application onto teeth 3 (three) times daily.   Biotene/Calcium PBF Liqd Use as directed 15 mLs in the mouth or throat 2 (two) times daily.   clonazePAM 1 MG tablet Commonly known as: KLONOPIN Take 0.5-1 mg by mouth See admin instructions. Take 1 tablet ('1mg'$ ) by mouth every morning and take  tablet (0.'5mg'$ ) by  mouth every night at bedtime   DIABETIC TUSSIN EX 100 MG/5ML liquid Generic drug: guaiFENesin Take 10 mLs by mouth every 6 (six) hours as needed for cough or to loosen phlegm.   divalproex 125 MG capsule Commonly known as: DEPAKOTE SPRINKLE Take 250-375 mg by mouth See admin instructions. Take 2 capsules ('250mg'$ ) by mouth every morning, 2 capsules ('250mg'$ ) every day at noon then take 3 capsules ('375mg'$ ) by mouth every night at bedtime   docusate sodium 100 MG capsule Commonly known as: COLACE Take 100 mg by mouth 2 (two) times daily as needed for mild constipation.   donepezil 10 MG tablet Commonly known as: ARICEPT Take 5 mg by mouth 2 (two) times daily.    DULoxetine 60 MG capsule Commonly known as: CYMBALTA Take 60 mg by mouth daily. (Take with '30mg'$  capsule to equal '90mg'$  total)   DULoxetine 30 MG capsule Commonly known as: CYMBALTA Take 30 mg by mouth daily. (Take with '60mg'$  capsule to equal '90mg'$  total)   enoxaparin 40 MG/0.4ML injection Commonly known as: LOVENOX Inject 0.4 mLs (40 mg total) into the skin daily.   ferrous gluconate 324 MG tablet Commonly known as: FERGON Take 1 tablet (324 mg total) by mouth 2 (two) times daily with a meal. What changed:  medication strength how much to take when to take this   folic acid 630 MCG tablet Commonly known as: FOLVITE Take 800 mcg by mouth daily.   HYDROcodone-acetaminophen 7.5-325 MG tablet Commonly known as: NORCO Take 1 tablet by mouth every 6 (six) hours as needed for moderate pain or severe pain.   letrozole 2.5 MG tablet Commonly known as: FEMARA Take 2.5 mg by mouth daily.   loperamide 2 MG capsule Commonly known as: IMODIUM Take 2 mg by mouth as needed for diarrhea or loose stools.   magnesium hydroxide 400 MG/5ML suspension Commonly known as: MILK OF MAGNESIA Take 30 mLs by mouth daily as needed for mild constipation or moderate constipation.   Mapap 500 MG tablet Generic drug: acetaminophen Take 1,000 mg by mouth 3 (three) times daily.   acetaminophen 325 MG tablet Commonly known as: TYLENOL Take 325-650 mg by mouth every 6 (six) hours as needed for mild pain or moderate pain.   memantine 10 MG tablet Commonly known as: NAMENDA Take 10 mg by mouth 2 (two) times daily.   mirtazapine 7.5 MG tablet Commonly known as: REMERON Take 7.5 mg by mouth at bedtime.   nystatin powder Commonly known as: MYCOSTATIN/NYSTOP Apply 1 Application topically every 12 (twelve) hours as needed (fungal symptoms in skin folds).   nystatin-triamcinolone cream Commonly known as: MYCOLOG II Apply 1 Application topically 2 (two) times daily as needed (skin eruptions).    pantoprazole 40 MG tablet Commonly known as: PROTONIX Take 40 mg by mouth in the morning.   phenylephrine-shark liver oil-mineral oil-petrolatum 0.25-14-74.9 % rectal ointment Commonly known as: PREPARATION H Place 1 Application rectally daily as needed for hemorrhoids.   polyethylene glycol 17 g packet Commonly known as: MIRALAX / GLYCOLAX Take 17 g by mouth at bedtime.   pseudoephedrine 30 MG tablet Commonly known as: SUDAFED Take 30 mg by mouth every 6 (six) hours as needed for congestion.   QUEtiapine 300 MG tablet Commonly known as: SEROQUEL Take 300 mg by mouth at bedtime.   QUEtiapine 50 MG tablet Commonly known as: SEROQUEL Take 50 mg by mouth 2 (two) times daily.   Rexulti 1 MG Tabs tablet Generic drug: brexpiprazole Take 1 mg by  mouth in the morning.   vitamin D3 25 MCG tablet Commonly known as: CHOLECALCIFEROL Take 1,000 Units by mouth daily.               Discharge Care Instructions  (From admission, onward)           Start     Ordered   02/22/22 0000  Leave dressing on - Keep it clean, dry, and intact until clinic visit        02/22/22 1049            Contact information for follow-up providers     Lattie Corns, PA-C Follow up in 2 week(s).   Specialty: Physician Assistant Contact information: [place] [address] [phone]         Janace Litten., MD. Schedule an appointment as soon as possible for a visit in 1 week(s).   Specialty: Internal Medicine Contact information: [place] [address] [phone]              Contact information for after-discharge care     [facility] .   Service: Skilled Nursing Contact information: [place] [phone]                    Discharge Exam: Danley Danker Weights   02/13/22 1104  Weight: 61 kg   General.  Cognitively  impaired, aphasic lady, in no acute distress. Pulmonary.  Lungs clear bilaterally, normal respiratory effort. CV.  Regular rate and rhythm, no JVD, rub or murmur. Abdomen.  Soft, nontender, nondistended, BS positive. CNS.  Alert and oriented .  No focal neurologic deficit. Extremities.  No edema, no cyanosis, pulses intact and symmetrical. Psychiatry.  Judgment and insight appears impaired.  Condition at discharge: stable  The results of significant diagnostics from this hospitalization (including imaging, microbiology, ancillary and laboratory) are listed below for reference.   Imaging Studies: DG FEMUR MIN 2 VIEWS LEFT  Result Date: 02/15/2022 CLINICAL DATA:  Left distal femur surgery. EXAM: LEFT FEMUR 2 VIEWS COMPARISON:  Left femur x-ray 02/14/2022. FINDINGS: Intraoperative left femur. Five low resolution intraoperative spot views of the left femur were obtained. There is a new sideplate and screws fixating distal femoral fracture. Alignment is anatomic. Tibial hardware is grossly unchanged. Total fluoroscopy time: 1 minute 2 seconds Total radiation dose: Not recorded IMPRESSION: Intraoperative left femur radiographs demonstrating sideplate and screws fixating distal femoral fracture. Electronically Signed   By: Ronney Asters M.D.   On: 02/15/2022 18:51   DG C-Arm 1-60 Min-No Report  Result Date: 02/15/2022 Fluoroscopy was utilized by the requesting physician.  No radiographic interpretation.   DG FEMUR MIN 2 VIEWS LEFT  Result Date: 02/14/2022 CLINICAL DATA:  Distal femur pain post fall EXAM: LEFT FEMUR 2 VIEWS COMPARISON:  Low FINDINGS: Osseous demineralization. Degenerative changes at LEFT knee joint. Prior ORIF proximal tibia. Oblique displaced fracture distal LEFT femoral metaphysis with overlying soft tissue swelling. Proximal femur is intact. No dislocation identified. IMPRESSION: Oblique mildly displaced distal LEFT femoral metaphyseal fracture. Osseous demineralization.  Electronically Signed   By: Lavonia Dana M.D.   On: 02/14/2022 13:34   DG Pelvis 1-2 Views  Result Date: 02/14/2022 CLINICAL DATA:  Pain post fall EXAM: PELVIS - 1-2 VIEW COMPARISON:  None Available. FINDINGS: Osseous demineralization. Mild narrowing of hip joints bilaterally. SI joints preserved. No acute fracture, dislocation, or bone destruction. Degenerative disc  disease changes and mild scoliosis at visualized lower lumbar spine. Significantly increased stool in colon and rectum. IMPRESSION: Osseous mineralization with mild degenerative changes of BILATERAL hip joints. No acute osseous findings. Significantly increased stool in colon and rectum. Electronically Signed   By: Lavonia Dana M.D.   On: 02/14/2022 13:32   DG Chest 2 View  Result Date: 02/13/2022 CLINICAL DATA:  Cough, COVID positive EXAM: CHEST - 2 VIEW COMPARISON:  01/26/2021 FINDINGS: Cardiac size is within normal limits. Lung fields are clear of any infiltrates or pulmonary edema. There is no pleural effusion or pneumothorax. Dextroscoliosis is seen in thoracic spine. Calcification is noted in left paratracheal region in the lower neck, possibly in thyroid. Surgical clips are seen in right chest wall. IMPRESSION: No active cardiopulmonary disease. Electronically Signed   By: Elmer Picker M.D.   On: 02/13/2022 11:54   CT Head Wo Contrast  Result Date: 02/13/2022 CLINICAL DATA:  Altered mental status. EXAM: CT HEAD WITHOUT CONTRAST TECHNIQUE: Contiguous axial images were obtained from the base of the skull through the vertex without intravenous contrast. RADIATION DOSE REDUCTION: This exam was performed according to the departmental dose-optimization program which includes automated exposure control, adjustment of the mA and/or kV according to patient size and/or use of iterative reconstruction technique. COMPARISON:  None Available. FINDINGS: Brain: No evidence of acute infarction, hemorrhage, hydrocephalus, extra-axial collection  or mass lesion/mass effect. Vascular: No hyperdense vessel or unexpected calcification. Skull: Normal. Negative for fracture or focal lesion. Sinuses/Orbits: Probable bilateral ethmoid and maxillary sinusitis. Other: None. IMPRESSION: No acute intracranial abnormality seen. Electronically Signed   By: Marijo Conception M.D.   On: 02/13/2022 11:49    Microbiology: Results for orders placed or performed during the hospital encounter of 02/13/22  Resp Panel by RT-PCR (Flu A&B, Covid) Anterior Nasal Swab     Status: None   Collection Time: 02/13/22 11:25 AM   Specimen: Anterior Nasal Swab  Result Value Ref Range Status   SARS Coronavirus 2 by RT PCR NEGATIVE NEGATIVE Final    Comment: (NOTE) SARS-CoV-2 target nucleic acids are NOT DETECTED.  The SARS-CoV-2 RNA is generally detectable in upper respiratory specimens during the acute phase of infection. The lowest concentration of SARS-CoV-2 viral copies this assay can detect is 138 copies/mL. A negative result does not preclude SARS-Cov-2 infection and should not be used as the sole basis for treatment or other patient management decisions. A negative result may occur with  improper specimen collection/handling, submission of specimen other than nasopharyngeal swab, presence of viral mutation(s) within the areas targeted by this assay, and inadequate number of viral copies(<138 copies/mL). A negative result must be combined with clinical observations, patient history, and epidemiological information. The expected result is Negative.  Fact Sheet for Patients:  EntrepreneurPulse.com.au  Fact Sheet for Healthcare Providers:  IncredibleEmployment.be  This test is no t yet approved or cleared by the [place] FDA and  has been authorized for detection and/or diagnosis of SARS-CoV-2 by FDA under an Emergency Use Authorization (EUA). This EUA will remain  in effect (meaning this test can be used) for the  duration of the COVID-19 declaration under Section 564(b)(1) of the Act, 21 U.S.C.section 360bbb-3(b)(1), unless the authorization is terminated  or revoked sooner.       Influenza A by PCR NEGATIVE NEGATIVE Final   Influenza B by PCR NEGATIVE NEGATIVE Final    Comment: (NOTE) The Xpert Xpress SARS-CoV-2/FLU/RSV plus assay is intended as an aid in the diagnosis of  influenza from Nasopharyngeal swab specimens and should not be used as a sole basis for treatment. Nasal washings and aspirates are unacceptable for Xpert Xpress SARS-CoV-2/FLU/RSV testing.  Fact Sheet for Patients: EntrepreneurPulse.com.au  Fact Sheet for Healthcare Providers: IncredibleEmployment.be  This test is not yet approved or cleared by the [place] FDA and has been authorized for detection and/or diagnosis of SARS-CoV-2 by FDA under an Emergency Use Authorization (EUA). This EUA will remain in effect (meaning this test can be used) for the duration of the COVID-19 declaration under Section 564(b)(1) of the Act, 21 U.S.C. section 360bbb-3(b)(1), unless the authorization is terminated or revoked.  Performed at [facility], [address] [place], [place] [address]   Urine Culture     Status: Abnormal   Collection Time: 02/13/22  2:22 PM   Specimen: Urine, Clean Catch  Result Value Ref Range Status   Specimen Description   Final    URINE, CLEAN CATCH Performed at [facility], [address] [place], [place] [address]    Special Requests   Final    NONE Performed at [facility], [place], [place] [address]    Culture >=100,000 COLONIES/mL ESCHERICHIA COLI (A)  Final   Report Status 02/15/2022 FINAL  Final   Organism ID, Bacteria ESCHERICHIA COLI (A)  Final      Susceptibility   Escherichia coli - MIC*    AMPICILLIN <=2 SENSITIVE Sensitive     CEFAZOLIN <=4 SENSITIVE Sensitive     CEFEPIME <=0.12 SENSITIVE  Sensitive     CEFTRIAXONE <=0.25 SENSITIVE Sensitive     CIPROFLOXACIN <=0.25 SENSITIVE Sensitive     GENTAMICIN <=1 SENSITIVE Sensitive     IMIPENEM <=0.25 SENSITIVE Sensitive     NITROFURANTOIN <=16 SENSITIVE Sensitive     TRIMETH/SULFA <=20 SENSITIVE Sensitive     AMPICILLIN/SULBACTAM <=2 SENSITIVE Sensitive     PIP/TAZO <=4 SENSITIVE Sensitive     * >=100,000 COLONIES/mL ESCHERICHIA COLI    Labs: CBC: Recent Labs  Lab 02/16/22 1016 02/16/22 1640 02/17/22 0958 02/18/22 1033 02/19/22 0528 02/21/22 0549  WBC 8.5  --  7.0 7.7 9.0 8.6  HGB 6.8* 7.5* 9.0* 8.4* 8.3* 8.3*  HCT 20.7* 22.3* 27.1* 25.0* 25.1* 25.8*  MCV 92.0  --  89.7 89.9 90.6 91.8  PLT 158  --  239 245 288 580*   Basic Metabolic Panel: No results for input(s): "NA", "K", "CL", "CO2", "GLUCOSE", "BUN", "CREATININE", "CALCIUM", "MG", "PHOS" in the last 168 hours. Liver Function Tests: No results for input(s): "AST", "ALT", "ALKPHOS", "BILITOT", "PROT", "ALBUMIN" in the last 168 hours. CBG: Recent Labs  Lab 02/19/22 0800 02/20/22 0829 02/21/22 1200 02/21/22 2122 02/22/22 0835  GLUCAP 101* 116* 90 118* 135*    Discharge time spent: greater than 30 minutes.  This record has been created using Systems analyst. Errors have been sought and corrected,but may not always be located. Such creation errors do not reflect on the standard of care.   Signed: Lorella Nimrod, MD [facility] 02/22/2022

## 2022-02-22 NOTE — Care Management Important Message (Signed)
Important Message  Patient Details  Name: Carly Collins MRN: [id number] Date of Birth: [birth year]   Medicare Important Message Given:  Yes  Patient's legal guardian, Carly Collins (brother) returned my call. He is agreement with the discharge when she is ready. I asked if he would like a copy but he declined it. I thanked him for his time.   Juliann Pulse A Nature Vogelsang 02/22/2022, 10:02 AM

## 2022-03-08 ENCOUNTER — Emergency Department: Payer: Medicare Other

## 2022-03-08 ENCOUNTER — Emergency Department
Admission: EM | Admit: 2022-03-08 | Discharge: 2022-03-09 | Disposition: A | Discharge status: Home / Self Care | Payer: Medicare Other | Attending: Emergency Medicine | Admitting: Emergency Medicine

## 2022-03-08 DIAGNOSIS — X58XXXA Exposure to other specified factors, initial encounter: Secondary | ICD-10-CM | POA: Insufficient documentation

## 2022-03-08 DIAGNOSIS — K209 Esophagitis, unspecified without bleeding: Secondary | ICD-10-CM | POA: Diagnosis not present

## 2022-03-08 DIAGNOSIS — T189XXA Foreign body of alimentary tract, part unspecified, initial encounter: Secondary | ICD-10-CM | POA: Diagnosis present

## 2022-03-08 DIAGNOSIS — R0989 Other specified symptoms and signs involving the circulatory and respiratory systems: Secondary | ICD-10-CM

## 2022-03-08 DIAGNOSIS — Z9049 Acquired absence of other specified parts of digestive tract: Secondary | ICD-10-CM | POA: Insufficient documentation

## 2022-03-08 LAB — COMPREHENSIVE METABOLIC PANEL
ALT: 20 U/L (ref 0–44)
AST: 38 U/L (ref 15–41)
Albumin: 3.4 g/dL — ABNORMAL LOW (ref 3.5–5.0)
Alkaline Phosphatase: 253 U/L — ABNORMAL HIGH (ref 38–126)
Anion gap: 7 (ref 5–15)
BUN: 30 mg/dL — ABNORMAL HIGH (ref 8–23)
CO2: 29 mmol/L (ref 22–32)
Calcium: 8.8 mg/dL — ABNORMAL LOW (ref 8.9–10.3)
Chloride: 102 mmol/L (ref 98–111)
Creatinine, Ser: 1.03 mg/dL — ABNORMAL HIGH (ref 0.44–1.00)
GFR, Estimated: >60 mL/min (ref >60)
Glucose, Bld: 151 mg/dL — ABNORMAL HIGH (ref 70–99)
Potassium: 5 mmol/L (ref 3.5–5.1)
Sodium: 138 mmol/L (ref 135–145)
Total Bilirubin: 0.6 mg/dL (ref 0.3–1.2)
Total Protein: 6.9 g/dL (ref 6.5–8.1)

## 2022-03-08 LAB — CBC WITH DIFFERENTIAL/PLATELET
Abs Immature Granulocytes: 0.04 10*3/uL (ref 0.00–0.07)
Basophils Absolute: 0 10*3/uL (ref 0.0–0.1)
Basophils Relative: 0 %
Eosinophils Absolute: 0.2 10*3/uL (ref 0.0–0.5)
Eosinophils Relative: 2 %
HCT: 34.2 % — ABNORMAL LOW (ref 36.0–46.0)
Hemoglobin: 10.5 g/dL — ABNORMAL LOW (ref 12.0–15.0)
Immature Granulocytes: 1 %
Lymphocytes Relative: 36 %
Lymphs Abs: 2.5 10*3/uL (ref 0.7–4.0)
MCH: 29.7 pg (ref 26.0–34.0)
MCHC: 30.7 g/dL (ref 30.0–36.0)
MCV: 96.9 fL (ref 80.0–100.0)
Monocytes Absolute: 0.5 10*3/uL (ref 0.1–1.0)
Monocytes Relative: 7 %
Neutro Abs: 3.7 10*3/uL (ref 1.7–7.7)
Neutrophils Relative %: 54 %
Platelets: 316 10*3/uL (ref 150–400)
RBC: 3.53 MIL/uL — ABNORMAL LOW (ref 3.87–5.11)
RDW: 14.4 % (ref 11.5–15.5)
WBC: 6.9 10*3/uL (ref 4.0–10.5)
nRBC: 0 % (ref 0.0–0.2)

## 2022-03-08 LAB — LIPASE, BLOOD: Lipase: 92 U/L — ABNORMAL HIGH (ref 11–51)

## 2022-03-08 MED ORDER — PANTOPRAZOLE SODIUM 40 MG PO TBEC: 40.0000 mg | DELAYED_RELEASE_TABLET | Freq: Every morning | ORAL | 0 refills | Status: AC

## 2022-03-08 MED ORDER — ALUM & MAG HYDROXIDE-SIMETH 200-200-20 MG/5ML PO SUSP
30.0000 mL | Freq: Once | ORAL | Status: AC
  Administered 2022-03-08: 30 mL via ORAL
  Filled 2022-03-08: qty 30

## 2022-03-08 MED ORDER — IOHEXOL 300 MG/ML  SOLN
80.0000 mL | Freq: Once | INTRAMUSCULAR | Status: AC | PRN
  Administered 2022-03-08: 80 mL via INTRAVENOUS

## 2022-03-08 MED ORDER — LIDOCAINE VISCOUS HCL 2 % MT SOLN
15.0000 mL | Freq: Once | OROMUCOSAL | Status: AC
  Administered 2022-03-08: 15 mL via ORAL
  Filled 2022-03-08: qty 15

## 2022-03-08 NOTE — ED Notes (Signed)
Bed alarm placed on bed, multiple warm blankets, call light in reach.

## 2022-03-08 NOTE — Discharge Instructions (Signed)
Please seek medical attention for any high fevers, chest pain, shortness of breath, change in behavior, persistent vomiting, bloody stool or any other new or concerning symptoms.  

## 2022-03-08 NOTE — ED Notes (Signed)
Pt was able to drink water and swallow liquid medications without any difficulty

## 2022-03-08 NOTE — ED Provider Notes (Signed)
[facility] Provider Note    Event Date/Time   First MD Initiated Contact with Patient 03/08/22 1830     (approximate)   History   Choking   HPI Carly Collins is a 66 y.o. female  who presents to the emergency department today because of concern for choking episodes. Unfortunately patient is unable to give any history, however report is that patient has now had two choking episodes. The first one occurred at lunch and heimlich was performed and food was expelled. The second happened at dinner when she choked on a piece of bread and again heimlich was performed and the bread was expelled. The patient then apparently started complaining of abdominal pain.      Physical Exam   Triage Vital Signs: ED Triage Vitals  Enc Vitals Group     BP 03/08/22 1842 127/65     Pulse Rate 03/08/22 1842 88     Resp 03/08/22 1842 14     Temp 03/08/22 1842 98.7 F (37.1 C)     Temp Source 03/08/22 1842 Oral     SpO2 03/08/22 1842 97 %     Weight --      Height 03/08/22 1843 '5\' 2"'$  (1.575 m)     Head Circumference --      Peak Flow --      Pain Score --      Pain Loc --      Pain Edu? --      Excl. in [place]? --     Most recent vital signs: Vitals:   03/08/22 1842  BP: 127/65  Pulse: 88  Resp: 14  Temp: 98.7 F (37.1 C)  SpO2: 97%   General: Awake, alert, not oriented. CV:  Good peripheral perfusion. Regular rate and rhythm. Resp:  Normal effort. Lungs clear. Abd:  No distention. Tender to palpation diffusely.    ED Results / Procedures / Treatments   Labs (all labs ordered are listed, but only abnormal results are displayed) Labs Reviewed  CBC WITH DIFFERENTIAL/PLATELET - Abnormal; Notable for the following components:      Result Value   RBC 3.53 (*)    Hemoglobin 10.5 (*)    HCT 34.2 (*)    All other components within normal limits  COMPREHENSIVE METABOLIC PANEL - Abnormal; Notable for the following components:   Glucose, Bld 151 (*)    BUN 30  (*)    Creatinine, Ser 1.03 (*)    Calcium 8.8 (*)    Albumin 3.4 (*)    Alkaline Phosphatase 253 (*)    All other components within normal limits  LIPASE, BLOOD - Abnormal; Notable for the following components:   Lipase 92 (*)    All other components within normal limits     EKG  I, Nance Pear, attending physician, personally viewed and interpreted this EKG  EKG Time: 1834 Rate: 95 Rhythm: sinus rhythm Axis: left axis deviation Intervals: qtc 452 QRS: narrow ST changes: no st elevation Impression: abnormal ekg  RADIOLOGY I independently interpreted and visualized the CXR. My interpretation: No pneumonia Radiology interpretation:  IMPRESSION:  No active cardiopulmonary disease.    I independently interpreted and visualized the CT abd/pel. My interpretation: Abdominal wall hernia. No free air Radiology interpretation:  IMPRESSION:  Lower esophageal wall thickening, correlate for esophagitis.    Status post cholecystectomy. Mild intrahepatic and moderate  extrahepatic ductal dilatation, similar to the prior.    Status post left hemicolectomy.    Mildly  thick-walled bladder, favoring neurogenic bladder over  cystitis.    Additional ancillary findings as above.     PROCEDURES:  Critical Care performed: No  Procedures   MEDICATIONS ORDERED IN ED: Medications - No data to display   IMPRESSION / MDM / [place] / ED COURSE  I reviewed the triage vital signs and the nursing notes.                              Differential diagnosis includes, but is not limited to, esophageal spasms, aspiration, food impaction  Patient's presentation is most consistent with acute presentation with potential threat to life or bodily function.  Patient presented to the emergency department today because of concerns for 2 episodes of choking today.  Exam patient is awake alert in no distress.  She is not oriented.  Checks x-ray without any findings concerning  for aspiration.  Did obtain a CT abdomen pelvis which shows possible esophagitis.  Patient was given a GI cocktail here.  She was then able to swallow without difficulty.  We will plan on discharging back to facility and treating for esophagitis.  FINAL CLINICAL IMPRESSION(S) / ED DIAGNOSES   Final diagnoses:  Choking episode  Esophagitis     Note:  This document was prepared using Dragon voice recognition software and may include unintentional dictation errors.    Nance Pear, MD 03/08/22 2158

## 2022-03-08 NOTE — ED Notes (Signed)
Request made for [facility] to transport pt to [facility]

## 2022-06-11 ENCOUNTER — Other Ambulatory Visit: Payer: Self-pay

## 2022-06-11 ENCOUNTER — Emergency Department: Payer: Medicare Other

## 2022-06-11 ENCOUNTER — Emergency Department
Admission: EM | Admit: 2022-06-11 | Discharge: 2022-06-11 | Disposition: A | Discharge status: Home / Self Care | Payer: Medicare Other | Attending: Emergency Medicine | Admitting: Emergency Medicine

## 2022-06-11 DIAGNOSIS — I10 Essential (primary) hypertension: Secondary | ICD-10-CM | POA: Insufficient documentation

## 2022-06-11 DIAGNOSIS — R5383 Other fatigue: Secondary | ICD-10-CM

## 2022-06-11 DIAGNOSIS — R531 Weakness: Secondary | ICD-10-CM | POA: Diagnosis present

## 2022-06-11 LAB — URINALYSIS, ROUTINE W REFLEX MICROSCOPIC
Bacteria, UA: NONE SEEN
Bilirubin Urine: NEGATIVE
Glucose, UA: NEGATIVE mg/dL
Hgb urine dipstick: NEGATIVE
Ketones, ur: NEGATIVE mg/dL
Nitrite: NEGATIVE
Protein, ur: NEGATIVE mg/dL
Specific Gravity, Urine: 1.009 (ref 1.005–1.030)
pH: 6 (ref 5.0–8.0)

## 2022-06-11 LAB — CBC
HCT: 34.6 % — ABNORMAL LOW (ref 36.0–46.0)
Hemoglobin: 11.1 g/dL — ABNORMAL LOW (ref 12.0–15.0)
MCH: 28.7 pg (ref 26.0–34.0)
MCHC: 32.1 g/dL (ref 30.0–36.0)
MCV: 89.4 fL (ref 80.0–100.0)
Platelets: 236 10*3/uL (ref 150–400)
RBC: 3.87 MIL/uL (ref 3.87–5.11)
RDW: 14.6 % (ref 11.5–15.5)
WBC: 5.7 10*3/uL (ref 4.0–10.5)
nRBC: 0 % (ref 0.0–0.2)

## 2022-06-11 LAB — BASIC METABOLIC PANEL
Anion gap: 10 (ref 5–15)
BUN: 21 mg/dL (ref 8–23)
CO2: 29 mmol/L (ref 22–32)
Calcium: 9 mg/dL (ref 8.9–10.3)
Chloride: 97 mmol/L — ABNORMAL LOW (ref 98–111)
Creatinine, Ser: 0.96 mg/dL (ref 0.44–1.00)
GFR, Estimated: >60 mL/min (ref >60)
Glucose, Bld: 156 mg/dL — ABNORMAL HIGH (ref 70–99)
Potassium: 4.6 mmol/L (ref 3.5–5.1)
Sodium: 136 mmol/L (ref 135–145)

## 2022-06-11 LAB — CBG MONITORING, ED: Glucose-Capillary: 144 mg/dL — ABNORMAL HIGH (ref 70–99)

## 2022-06-11 MED ORDER — SODIUM CHLORIDE 0.9 % IV BOLUS
500.0000 mL | Freq: Once | INTRAVENOUS | Status: AC
  Administered 2022-06-11: 500 mL via INTRAVENOUS

## 2022-06-11 NOTE — ED Provider Notes (Signed)
[facility] Provider Note    Event Date/Time   First MD Initiated Contact with Patient 06/11/22 [phone]     (approximate)   History   Weakness   HPI  Carly Collins is a 67 y.o. female past medical history significant for cognitive impairment, hypertension, hyperlipidemia, GERD, anxiety, who presents to the emergency department with confusion.  Patient goes to adult daycare and was not as talkative as she normally is so EMS was called.  Patient just states that she is hungry.  Knows her name but is uncertain of the time.  Opens her eyes to voice.  Just states that she feels tired.  Denies any falls.  Caregiver from the group home stated that she did have a new medication added on to help her sleep last week.     Physical Exam   Triage Vital Signs: ED Triage Vitals  Enc Vitals Group     BP 06/11/22 0927 (!) 110/56     Pulse Rate 06/11/22 0927 65     Resp 06/11/22 0927 16     Temp 06/11/22 0927 97.7 F (36.5 C)     Temp Source 06/11/22 0927 Axillary     SpO2 06/11/22 0927 95 %     Weight --      Height --      Head Circumference --      Peak Flow --      Pain Score 06/11/22 0929 0     Pain Loc --      Pain Edu? --      Excl. in [place]? --     Most recent vital signs: Vitals:   06/11/22 1400 06/11/22 1500  BP: (!) 130/58 (!) 140/71  Pulse: 68 62  Resp: 14 16  Temp:  98 F (36.7 C)  SpO2: 100% 100%    Physical Exam Constitutional:      Appearance: She is well-developed.     Comments: Appears tired, will doze off but then easily wake up.  Following all commands.  HENT:     Head: Atraumatic.  Eyes:     Conjunctiva/sclera: Conjunctivae normal.  Cardiovascular:     Rate and Rhythm: Regular rhythm.     Heart sounds: No murmur heard. Pulmonary:     Effort: No respiratory distress.     Breath sounds: No wheezing.  Abdominal:     General: There is no distension.     Tenderness: There is no abdominal tenderness.  Musculoskeletal:         General: Normal range of motion.     Cervical back: Normal range of motion.     Comments: Moving all extremities  Skin:    General: Skin is warm.     Capillary Refill: Capillary refill takes less than 2 seconds.  Neurological:     General: No focal deficit present.     Mental Status: She is alert. Mental status is at baseline.     IMPRESSION / MDM / ASSESSMENT AND PLAN / ED COURSE  I reviewed the triage vital signs and the nursing notes.  Differential diagnosis including intracranial hemorrhage, dehydration, electrolyte abnormality, anemia, urinary tract infection, dehydration  EKG  I, Nathaniel Man, the attending physician, personally viewed and interpreted this ECG.   Rate: Normal  Rhythm: Normal sinus  Axis: Normal  Intervals: Normal  ST&T Change: None  No tachycardic or bradycardic dysrhythmias while on cardiac telemetry.  RADIOLOGY I independently reviewed imaging, my interpretation of imaging: CT scan of  the head no signs of intracranial hemorrhage.  Read as frothy secretions in the right sphenoid sinus concerning for possible acute sinusitis/correlate.  Denies any facial pain or headaches.  Do not feel that the patient needs treatment for acute sinusitis.  LABS (all labs ordered are listed, but only abnormal results are displayed) Labs interpreted as -   No signs of urinary tract infection Labs Reviewed  BASIC METABOLIC PANEL - Abnormal; Notable for the following components:      Result Value   Chloride 97 (*)    Glucose, Bld 156 (*)    All other components within normal limits  URINALYSIS, ROUTINE W REFLEX MICROSCOPIC - Abnormal; Notable for the following components:   Color, Urine YELLOW (*)    APPearance CLEAR (*)    Leukocytes,Ua TRACE (*)    All other components within normal limits  CBC - Abnormal; Notable for the following components:   Hemoglobin 11.1 (*)    HCT 34.6 (*)    All other components within normal limits  CBG MONITORING, ED - Abnormal;  Notable for the following components:   Glucose-Capillary 144 (*)    All other components within normal limits    TREATMENT 500 bolus of IV fluids   MDM   Tolerating p.o. and is back to her baseline according to her aunt at bedside.  Patient will be discharged back to her facility.   PROCEDURES:  Critical Care performed: No  Procedures  Patient's presentation is most consistent with acute presentation with potential threat to life or bodily function.   MEDICATIONS ORDERED IN ED: Medications  sodium chloride 0.9 % bolus 500 mL (500 mLs Intravenous Bolus 06/11/22 1203)    FINAL CLINICAL IMPRESSION(S) / ED DIAGNOSES   Final diagnoses:  Other fatigue     Rx / DC Orders   ED Discharge Orders     None        Note:  This document was prepared using Dragon voice recognition software and may include unintentional dictation errors.   Nathaniel Man, MD 06/11/22 [phone]

## 2022-06-11 NOTE — ED Notes (Signed)
Straight cath discussed with pt, pt became upset, pts armed positoned so the IVF will infuse.

## 2022-06-11 NOTE — ED Notes (Signed)
Pt to CT

## 2022-06-11 NOTE — ED Notes (Signed)
Report to [facility] at [facility]

## 2022-06-11 NOTE — ED Notes (Signed)
D/C and reasons to return discussed with aunt. Pt assisted to w/c, aunt of pt will be bring her back to [facility] home. This was discussed with Jasmine at [facility] and staff was asked to meet pt and aunt on arrival with walker and this was told to this RN they would accommodate that. NAD noted on D/C. VSS. Pt given blue scrub pants due to slight urination on clothes.

## 2022-06-11 NOTE — ED Notes (Signed)
Pt status given to legal Guardian who is the pt's brother Charlotte Crumb).

## 2022-06-11 NOTE — ED Notes (Signed)
Lab called to state CBC was hemolyzed, lab asked to come redraw due to pt being poked x4 with no success of PIV.

## 2022-06-11 NOTE — ED Triage Notes (Addendum)
Pt presents to ED via [facility] with c/o of weakness.Pt states she was at adult daycare and not as talkative as normal so EMS was called.   Pt from [employer].  Pt denies any complaints at this time, pt states she is just hungry at this time.  Pt does some learning delays per EMS> Pt is oriented to herself and place but thinks it is December, this RN unsure of baseline mentation, EMS not able to provide this at this time.  EMS states CBG was "normal". Pt does appear sleepy but does respond to light voice.   Pt denies any pain or SOB or any recent illness.   NAD noted. VSS.

## 2022-07-19 ENCOUNTER — Ambulatory Visit (INDEPENDENT_AMBULATORY_CARE_PROVIDER_SITE_OTHER): Payer: Medicare Other | Admitting: Podiatry

## 2022-07-19 DIAGNOSIS — M79674 Pain in right toe(s): Secondary | ICD-10-CM

## 2022-07-19 DIAGNOSIS — M79675 Pain in left toe(s): Secondary | ICD-10-CM | POA: Diagnosis not present

## 2022-07-19 DIAGNOSIS — N183 Chronic kidney disease, stage 3 unspecified: Secondary | ICD-10-CM

## 2022-07-19 DIAGNOSIS — B351 Tinea unguium: Secondary | ICD-10-CM

## 2022-07-19 NOTE — Progress Notes (Signed)
This patient returns to my office for at risk foot care.  This patient requires this care by a professional since this patient will be at risk due to having  CKD, diabetes and coagulation defect.  This patient is unable to cut nails herself since the patient cannot reach hernails.These nails are painful walking and wearing shoes.  This patient presents for at risk foot care today. She presents to the office with her daughter.  General Appearance  Alert but not conversant  Vascular  Dorsalis pedis and posterior tibial  pulses are palpable  bilaterally.  Capillary return is within normal limits  bilaterally. Temperature is within normal limits  bilaterally.  Neurologic  Senn-Weinstein monofilament wire test within normal limits  bilaterally. Muscle power within normal limits bilaterally.  Nails Thick disfigured discolored nails with subungual debris  hallux nails bilaterally. No evidence of bacterial infection or drainage bilaterally.  Orthopedic  No limitations of motion  feet .  No crepitus or effusions noted.  No bony pathology or digital deformities noted.  Skin  normotropic skin with no porokeratosis noted bilaterally.  No signs of infections or ulcers noted.     Onychomycosis  Pain in right toes  Pain in left toes  Consent was obtained for treatment procedures.   Mechanical debridement of nails 1-5  bilaterally performed with a nail nipper.  Filed with dremel without incident.    Return office visit   prn                  Told patient to return for periodic foot care and evaluation due to potential at risk complications.   Boneta Lucks D.P.M.

## 2022-09-16 ENCOUNTER — Other Ambulatory Visit: Payer: Self-pay

## 2022-09-16 ENCOUNTER — Emergency Department
Admission: EM | Admit: 2022-09-16 | Discharge: 2022-09-16 | Disposition: A | Discharge status: Home / Self Care | Payer: Medicare Other | Attending: Emergency Medicine | Admitting: Emergency Medicine

## 2022-09-16 DIAGNOSIS — R519 Headache, unspecified: Secondary | ICD-10-CM | POA: Diagnosis not present

## 2022-09-16 DIAGNOSIS — N3000 Acute cystitis without hematuria: Secondary | ICD-10-CM | POA: Insufficient documentation

## 2022-09-16 DIAGNOSIS — Z20822 Contact with and (suspected) exposure to covid-19: Secondary | ICD-10-CM | POA: Diagnosis not present

## 2022-09-16 DIAGNOSIS — R531 Weakness: Secondary | ICD-10-CM | POA: Diagnosis not present

## 2022-09-16 DIAGNOSIS — I10 Essential (primary) hypertension: Secondary | ICD-10-CM | POA: Insufficient documentation

## 2022-09-16 DIAGNOSIS — E119 Type 2 diabetes mellitus without complications: Secondary | ICD-10-CM | POA: Insufficient documentation

## 2022-09-16 LAB — BASIC METABOLIC PANEL
Anion gap: 7 (ref 5–15)
BUN: 34 mg/dL — ABNORMAL HIGH (ref 8–23)
CO2: 31 mmol/L (ref 22–32)
Calcium: 9 mg/dL (ref 8.9–10.3)
Chloride: 100 mmol/L (ref 98–111)
Creatinine, Ser: 0.79 mg/dL (ref 0.44–1.00)
GFR, Estimated: >60 mL/min (ref >60)
Glucose, Bld: 82 mg/dL (ref 70–99)
Potassium: 4.7 mmol/L (ref 3.5–5.1)
Sodium: 138 mmol/L (ref 135–145)

## 2022-09-16 LAB — URINALYSIS, ROUTINE W REFLEX MICROSCOPIC
Bacteria, UA: NONE SEEN
Bilirubin Urine: NEGATIVE
Glucose, UA: NEGATIVE mg/dL
Hgb urine dipstick: NEGATIVE
Ketones, ur: NEGATIVE mg/dL
Nitrite: NEGATIVE
Protein, ur: NEGATIVE mg/dL
Specific Gravity, Urine: 1.011 (ref 1.005–1.030)
Squamous Epithelial / HPF: NONE SEEN /HPF (ref 0–5)
WBC, UA: >50 WBC/hpf (ref 0–5)
pH: 6 (ref 5.0–8.0)

## 2022-09-16 LAB — SARS CORONAVIRUS 2 BY RT PCR: SARS Coronavirus 2 by RT PCR: NEGATIVE

## 2022-09-16 LAB — CBC
HCT: 34.1 % — ABNORMAL LOW (ref 36.0–46.0)
Hemoglobin: 11.1 g/dL — ABNORMAL LOW (ref 12.0–15.0)
MCH: 28.8 pg (ref 26.0–34.0)
MCHC: 32.6 g/dL (ref 30.0–36.0)
MCV: 88.6 fL (ref 80.0–100.0)
Platelets: 182 10*3/uL (ref 150–400)
RBC: 3.85 MIL/uL — ABNORMAL LOW (ref 3.87–5.11)
RDW: 14.3 % (ref 11.5–15.5)
WBC: 5.6 10*3/uL (ref 4.0–10.5)
nRBC: 0 % (ref 0.0–0.2)

## 2022-09-16 MED ORDER — CEPHALEXIN 500 MG PO CAPS
500.0000 mg | ORAL_CAPSULE | Freq: Once | ORAL | Status: AC
  Administered 2022-09-16: 500 mg via ORAL
  Filled 2022-09-16: qty 1

## 2022-09-16 MED ORDER — CEPHALEXIN 500 MG PO CAPS: 500.0000 mg | ORAL_CAPSULE | Freq: Three times a day (TID) | ORAL | 0 refills | Status: AC

## 2022-09-16 NOTE — ED Notes (Signed)
Attempts were made to contact the Legal Guardian without success

## 2022-09-16 NOTE — ED Provider Notes (Signed)
[facility] Provider Note    Event Date/Time   First MD Initiated Contact with Patient 09/16/22 1413     (approximate)   History   Weakness   HPI  Carly Collins is a 67 y.o. female with history of hypertension, diabetes, hyperlipidemia, and as listed in EMR presents to the emergency department for treatment and evaluation of generalized weakness and headache. She lives in a group home and has been exposed to Covid. No known fever, cough, nausea, vomiting, diarrhea. Group home managed reports she took her to [facility]      Physical Exam   Triage Vital Signs: ED Triage Vitals  Enc Vitals Group     BP 09/16/22 1216 108/66     Pulse Rate 09/16/22 1216 (!) 115     Resp 09/16/22 1216 16     Temp 09/16/22 1216 98.7 F (37.1 C)     Temp src --      SpO2 09/16/22 1216 94 %     Weight 09/16/22 1214 134 lb 7.7 oz (61 kg)     Height 09/16/22 1214 5\' 2"  (1.575 m)     Head Circumference --      Peak Flow --      Pain Score 09/16/22 1214 0     Pain Loc --      Pain Edu? --      Excl. in [place]? --     Most recent vital signs: Vitals:   09/16/22 1620 09/16/22 1727  BP: (!) 156/61 113/80  Pulse: 60 (!) 110  Resp: 18 18  Temp: 97.6 F (36.4 C) 97.6 F (36.4 C)  SpO2: 97% 96%    General: Awake, no distress.  CV:  Good peripheral perfusion.  Resp:  Normal effort.  Abd:  No distention. Soft Other:     ED Results / Procedures / Treatments   Labs (all labs ordered are listed, but only abnormal results are displayed) Labs Reviewed  BASIC METABOLIC PANEL - Abnormal; Notable for the following components:      Result Value   BUN 34 (*)    All other components within normal limits  CBC - Abnormal; Notable for the following components:   RBC 3.85 (*)    Hemoglobin 11.1 (*)    HCT 34.1 (*)    All other components within normal limits  URINALYSIS, ROUTINE W REFLEX MICROSCOPIC - Abnormal; Notable for the following components:   Color, Urine STRAW (*)     APPearance HAZY (*)    Leukocytes,Ua MODERATE (*)    All other components within normal limits  SARS CORONAVIRUS 2 BY RT PCR  CBG MONITORING, ED     EKG  Not indicated.   RADIOLOGY  Image and radiology report reviewed and interpreted by me. Radiology report consistent with the same.  Not indicated.  PROCEDURES:  Critical Care performed: No  Procedures   MEDICATIONS ORDERED IN ED:  Medications  cephALEXin (KEFLEX) capsule 500 mg (500 mg Oral Given 09/16/22 1722)     IMPRESSION / MDM / ASSESSMENT AND PLAN / ED COURSE   I have reviewed the triage note.  Differential diagnosis includes, but is not limited to, COVID, influenza, UTI  Patient's presentation is most consistent with acute complicated illness / injury requiring diagnostic workup.  67 year old female presenting to the emergency department for treatment and evaluation of generalized weakness after COVID exposure.  See HPI for further details.  COVID and influenza testing is negative.  Labs are reassuring.  No leukocytosis or electrolyte abnormality.  Urinalysis is concerning for acute cystitis.  She will be treated with Keflex.  Caregiver and guardian updated and agreeable with this plan.      FINAL CLINICAL IMPRESSION(S) / ED DIAGNOSES   Final diagnoses:  Acute cystitis without hematuria     Rx / DC Orders   ED Discharge Orders          Ordered    cephALEXin (KEFLEX) 500 MG capsule  3 times daily        09/16/22 1717             Note:  This document was prepared using Dragon voice recognition software and may include unintentional dictation errors.   Chinita Pester, FNP 09/16/22 Dory Larsen, MD 09/17/22 [phone]

## 2022-09-16 NOTE — ED Triage Notes (Signed)
C/o weakness since last Thursday.  Seen through Urgent Care for same on Thursday, diagnosed with viral illness.  LIves in Group Home, Covid going through home.  AAOx3.  Skin warm and dry. NAD

## 2022-10-18 ENCOUNTER — Encounter: Payer: Self-pay | Admitting: Podiatry

## 2022-10-18 ENCOUNTER — Ambulatory Visit (INDEPENDENT_AMBULATORY_CARE_PROVIDER_SITE_OTHER): Payer: Medicare Other | Admitting: Podiatry

## 2022-10-18 VITALS — BP 152/75

## 2022-10-18 DIAGNOSIS — B351 Tinea unguium: Secondary | ICD-10-CM | POA: Diagnosis not present

## 2022-10-18 DIAGNOSIS — M79675 Pain in left toe(s): Secondary | ICD-10-CM | POA: Diagnosis not present

## 2022-10-18 DIAGNOSIS — M79674 Pain in right toe(s): Secondary | ICD-10-CM

## 2022-10-18 NOTE — Progress Notes (Signed)
  Subjective:  Patient ID: Carly Collins, female    DOB: 01/04/1956,  MRN: [id number]  Carly Collins presents to clinic today for preventative diabetic foot care and painful elongated mycotic toenails 1-5 bilaterally which are tender when wearing enclosed shoe gear. Pain is relieved with periodic professional debridement. Chief Complaint  Patient presents with   Nail Problem    DFC,Winslow, Bristol R., MD,LOV:01/24,BS:unknown,A1C:7.0   New problem(s): None.   PCP is Conan Bowens., MD.  Allergies  Allergen Reactions   Pioglitazone Other (See Comments)    unknown Unknown to patient     Review of Systems: Negative except as noted in the HPI. Objective:   Constitutional Carly Collins is a pleasant 67 y.o. female, WD, WN in NAD. AAO x 3.   Vascular Vascular Examination: Capillary refill time immediate b/l. Vascular status intact b/l with palpable pedal pulses. Pedal hair present b/l. No edema. No pain with calf compression b/l. Skin temperature gradient WNL b/l.   Neurological Examination: Sensation grossly intact b/l with 10 gram monofilament. Vibratory sensation intact b/l.   Dermatological Examination: Pedal skin with normal turgor, texture and tone b/l.  No open wounds. No interdigital macerations.   Toenails 1-5 b/l thick, discolored, elongated with subungual debris and pain on dorsal palpation.   No hyperkeratotic nor porokeratotic lesions present on today's visit.  Musculoskeletal Examination: Muscle strength 5/5 to all lower extremity muscle groups bilaterally. No pain, crepitus or joint limitation noted with ROM bilateral LE. Utilizes wheelchair for mobility assistance.  Radiographs: None   Assessment:   1. Pain due to onychomycosis of toenails of both feet    Plan:  -Caregiver/provider present with patient on today's visit. -Patient to continue soft, supportive shoe gear daily. -Toenails 1-5 b/l were debrided in length and girth with sterile nail nippers  and dremel without iatrogenic bleeding.  -Patient/POA to call should there be question/concern in the interim.  Return in about 3 months (around 01/18/2023).  Freddie Breech, DPM

## 2022-12-16 DIAGNOSIS — R1312 Dysphagia, oropharyngeal phase: Secondary | ICD-10-CM | POA: Insufficient documentation

## 2023-01-30 ENCOUNTER — Encounter: Payer: Self-pay | Admitting: Podiatry

## 2023-01-30 ENCOUNTER — Ambulatory Visit (INDEPENDENT_AMBULATORY_CARE_PROVIDER_SITE_OTHER): Payer: Medicare Other | Admitting: Podiatry

## 2023-01-30 DIAGNOSIS — M79674 Pain in right toe(s): Secondary | ICD-10-CM | POA: Diagnosis not present

## 2023-01-30 DIAGNOSIS — M79675 Pain in left toe(s): Secondary | ICD-10-CM | POA: Diagnosis not present

## 2023-01-30 DIAGNOSIS — B351 Tinea unguium: Secondary | ICD-10-CM | POA: Diagnosis not present

## 2023-01-30 NOTE — Progress Notes (Signed)
Subjective:  Patient ID: Carly Collins, female    DOB: [birth year],  MRN: [id number]  67 y.o. female presents painful thick toenails that are difficult to trim. Pain interferes with ambulation. Aggravating factors include wearing enclosed shoe gear. Pain is relieved with periodic professional debridement. She is accompanied by her caregiver on today's visit. Chief Complaint  Patient presents with   Nail Problem    DFC,Referring Provider Conan Bowens., MD,lov:09/24,  A1C:unknown per caretaker    New problem(s): None   PCP is Conan Bowens., MD.  Allergies  Allergen Reactions   Pioglitazone Other (See Comments)    unknown Unknown to patient    Review of Systems: Negative except as noted in the HPI.   Objective:  Carly Collins is a pleasant 67 y.o. female WD, WN in NAD.Marland Kitchen AAO x 3.  Vascular Examination: Vascular status intact b/l with palpable pedal pulses. CFT immediate b/l. Pedal hair present. No edema. No pain with calf compression b/l. Skin temperature gradient WNL b/l. No varicosities noted. No cyanosis or clubbing noted.  Neurological Examination: Sensation grossly intact b/l with 10 gram monofilament. Vibratory sensation intact b/l.  Dermatological Examination: Pedal skin with normal turgor, texture and tone b/l. No open wounds nor interdigital macerations noted. Toenails 1-5 b/l thick, discolored, elongated with subungual debris and pain on dorsal palpation. No hyperkeratotic lesions noted b/l.   Musculoskeletal Examination: Muscle strength 5/5 to b/l LE.  No pain, crepitus noted b/l. No gross pedal deformities. Utilizes wheelchair for mobility assistance.  Radiographs: None Assessment:   1. Pain due to onychomycosis of toenails of both feet    Plan:  -Caregiver/provider present with patient on today's visit. -Continue supportive shoe gear daily. -Mycotic toenails 1-5 bilaterally were debrided in length and girth with sterile nail nippers and dremel  without incident. -Patient/POA to call should there be question/concern in the interim.  Return in about 3 months (around 05/01/2023).  Freddie Breech, DPM

## 2023-02-01 ENCOUNTER — Emergency Department: Payer: Medicare Other

## 2023-02-01 ENCOUNTER — Emergency Department
Admission: EM | Admit: 2023-02-01 | Discharge: 2023-02-01 | Disposition: A | Discharge status: Home / Self Care | Payer: Medicare Other | Attending: Emergency Medicine | Admitting: Emergency Medicine

## 2023-02-01 DIAGNOSIS — Z7901 Long term (current) use of anticoagulants: Secondary | ICD-10-CM | POA: Insufficient documentation

## 2023-02-01 DIAGNOSIS — M25562 Pain in left knee: Secondary | ICD-10-CM | POA: Insufficient documentation

## 2023-02-01 DIAGNOSIS — W19XXXA Unspecified fall, initial encounter: Secondary | ICD-10-CM | POA: Diagnosis not present

## 2023-02-01 DIAGNOSIS — Y92512 Supermarket, store or market as the place of occurrence of the external cause: Secondary | ICD-10-CM | POA: Diagnosis not present

## 2023-02-01 NOTE — Discharge Instructions (Addendum)
You were evaluated in the ED for left knee injury following a fall.  Your x-rays revealed no acute fractures or dislocations.  Alternate Tylenol and ibuprofen for pain as needed.  Apply ice over the affected area and keep knee elevated.  Follow-up with orthopedics if symptoms do not improve.

## 2023-02-01 NOTE — ED Triage Notes (Signed)
Pt to ED  c/o fall. Pt had mechanical fall C/o left knee pain. No blood thinners, No LOC, did not hit head.   Pt is from group [name]  contact info [phone]  Per EMS pt orientation at baseline.  Limited triage assessment  Last VS: 110/82, P74, 99%, 97.7

## 2023-02-01 NOTE — ED Notes (Signed)
Alisha from group home arrived to take patient home.

## 2023-02-01 NOTE — ED Notes (Signed)
Called patients group home and spoke to an employee. She stated that she would come and pick the patient up.

## 2023-02-01 NOTE — ED Provider Notes (Signed)
[facility] Emergency Department Provider Note     Event Date/Time   First MD Initiated Contact with Patient 02/01/23 1900     (approximate)   History   Knee Injury (left)   HPI  Carly Collins is a 67 y.o. female presents to the ED following a fall at the store onto her left knee.  Patient reports mild pain.  No noted deformity.  Patient is on anticoagulation. Denies hitting head or LOC.  Denies vomiting.  Patient is ambulatory.  No other complaint.     Physical Exam   Triage Vital Signs: ED Triage Vitals [02/01/23 1844]  Encounter Vitals Group     BP 101/76     Systolic BP Percentile      Diastolic BP Percentile      Pulse Rate 75     Resp 16     Temp 98.6 F (37 C)     Temp Source Oral     SpO2 100 %     Weight      Height      Head Circumference      Peak Flow      Pain Score      Pain Loc      Pain Education      Exclude from Growth Chart     Most recent vital signs: Vitals:   02/01/23 1844 02/01/23 2049  BP: 101/76 108/75  Pulse: 75 73  Resp: 16 16  Temp: 98.6 F (37 C)   SpO2: 100% 100%    General Well appearing.  Awake, no distress.  HEENT NCAT. PERRL. EOMI. No rhinorrhea. Mucous membranes are moist.  CV:  Good peripheral perfusion.  RESP:  Normal effort.  ABD:  No distention.  Other:  Left knee reveals no visible deformity.  Nontender to palpation over patella.  Full ROM without difficulty.    ED Results / Procedures / Treatments   Labs (all labs ordered are listed, but only abnormal results are displayed) Labs Reviewed - No data to display  RADIOLOGY  I personally viewed and evaluated these images as part of my medical decision making, as well as reviewing the written report by the radiologist.  No noted deformity.  DG Knee Complete 4 Views Left  Result Date: 02/01/2023 CLINICAL DATA:  Left knee pain, fall EXAM: LEFT KNEE - COMPLETE 4+ VIEW COMPARISON:  02/14/2022 FINDINGS: Prior ORIF of the distal left  femur and left tibial plateau with lateral sideplate and screw fixation constructs. Hardware intact. No signs of loosening. Distal femoral metaphyseal fracture is well healed. Chronic healed lateral tibial plateau fracture deformity. Tricompartmental osteoarthritis of the knee, moderate-severe within the lateral compartment. Trace knee joint effusion. No soft tissue swelling. IMPRESSION: 1. No acute fracture or dislocation of the left knee. 2. Tricompartmental osteoarthritis of the knee, moderate-severe within the lateral compartment. 3. Prior ORIF of the distal left femur and left tibial plateau without evidence of hardware complication. Electronically Signed   By: Duanne Guess D.O.   On: 02/01/2023 19:59    PROCEDURES:  Critical Care performed: No  Procedures  MEDICATIONS ORDERED IN ED: Medications - No data to display  IMPRESSION / MDM / ASSESSMENT AND PLAN / ED COURSE  I reviewed the triage vital signs and the nursing notes.                             Clinical Course as of 02/01/23  2207  Sat Feb 01, 2023  2205 DG Knee Complete 4 Views Left [MH]    Clinical Course User Index [MH] Conrad Ukiah, PA-C   67 y.o. female presents to the emergency department for evaluation and treatment of acute left knee pain following a mechanical fall. See HPI for further details.   Differential diagnosis includes, but is not limited to fracture, dislocation, sprain, effusion.  Patient's presentation is most consistent with acute complicated illness / injury requiring diagnostic workup.  Patient is alert and oriented and hemodynamically stable.  Physical exam findings are benign stated above.  X-rays are reassuring.  Patient is in stable condition for discharge home.  Encouraged RICE therapy at home and follow-up with orthopedic as needed. ED precautions discussed to return to the ED for any worsening or new symptoms. Patient verbalizes understanding. All questions and concerns were addressed  during ED visit.     FINAL CLINICAL IMPRESSION(S) / ED DIAGNOSES   Final diagnoses:  Acute pain of left knee    Rx / DC Orders   ED Discharge Orders     None       Note:  This document was prepared using Dragon voice recognition software and may include unintentional dictation errors.    Romeo Apple, Pawan Knechtel A, PA-C 02/01/23 2207    Jene Every, MD 02/02/23 1455

## 2023-02-13 ENCOUNTER — Ambulatory Visit: Payer: Medicare Other | Admitting: Podiatry

## 2023-03-11 IMAGING — CR DG CHEST 2V
1 series · 3 of 3 positions shown · non-contrast
Comparison: 11/06/2019

CLINICAL DATA: 64-year-old female with a history of aspiration

EXAM:
CHEST - 2 VIEW

[Series 1: dg chest 2 view · 0.14mm/px · 3 of 3 slices shown]
[im 1/3]
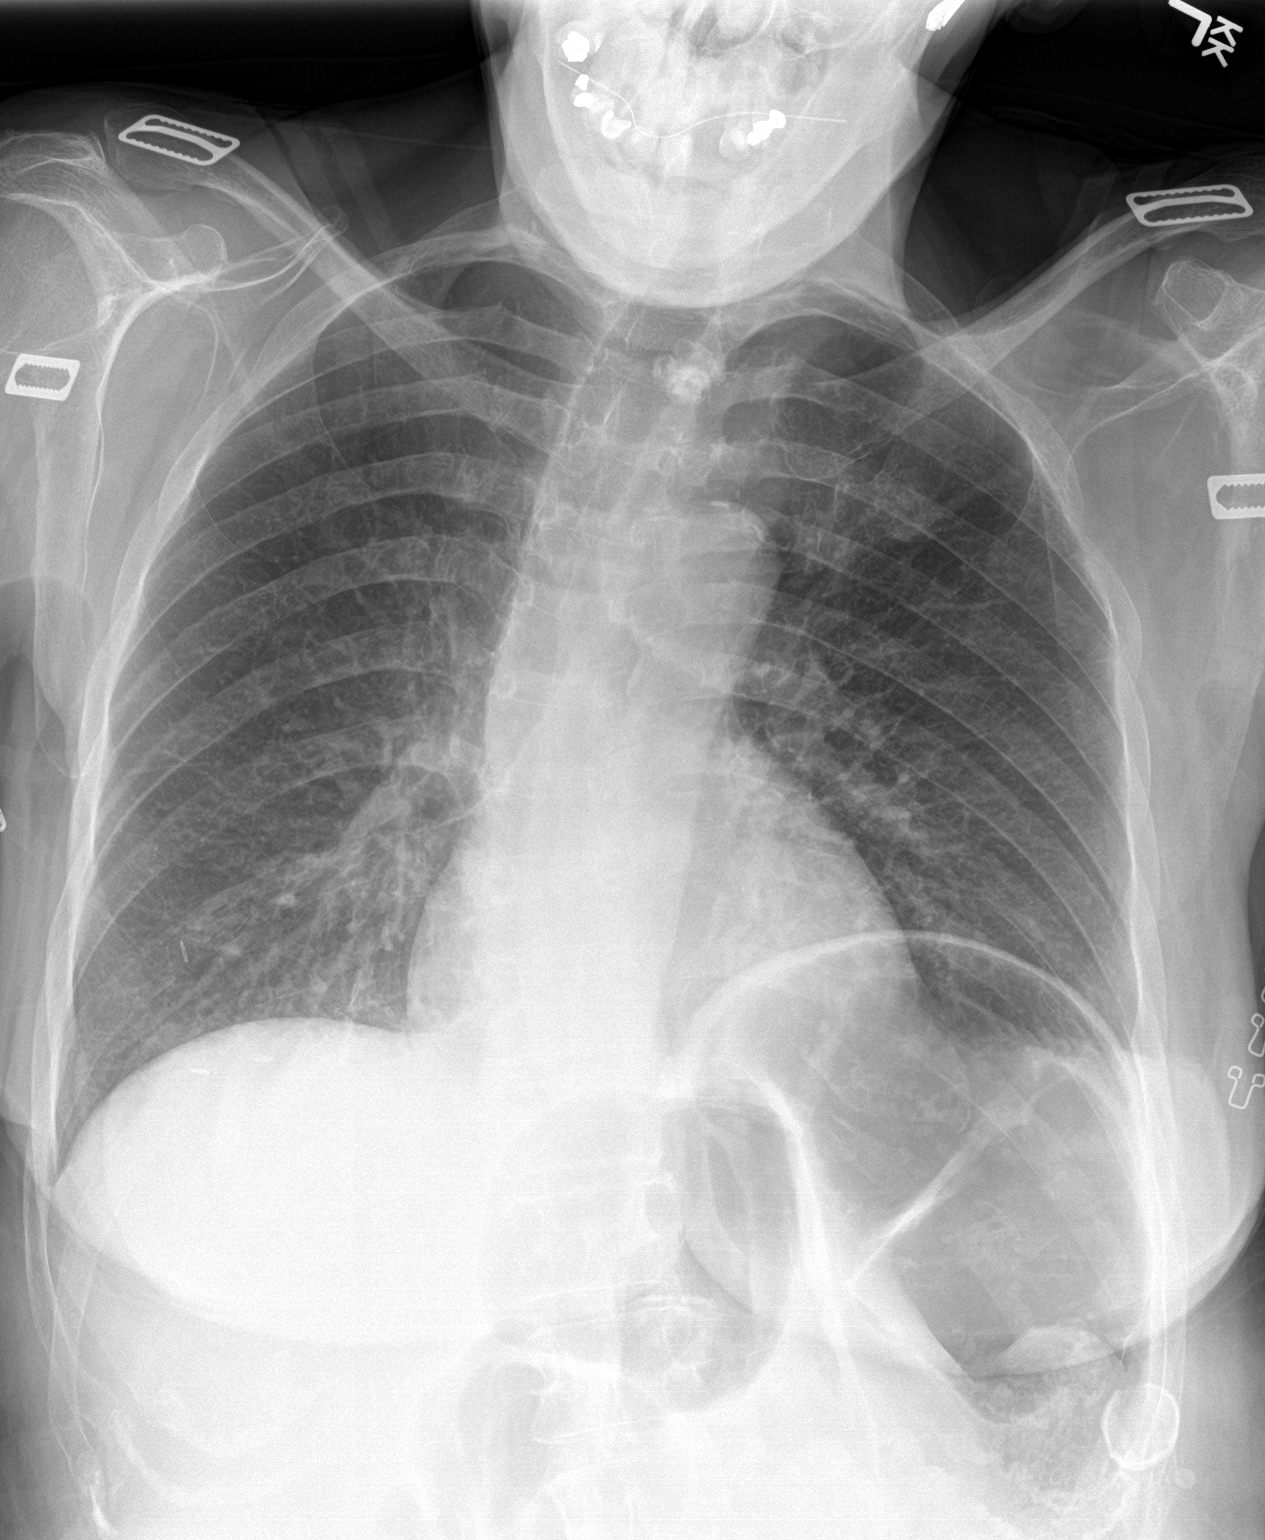
[im 2/3]
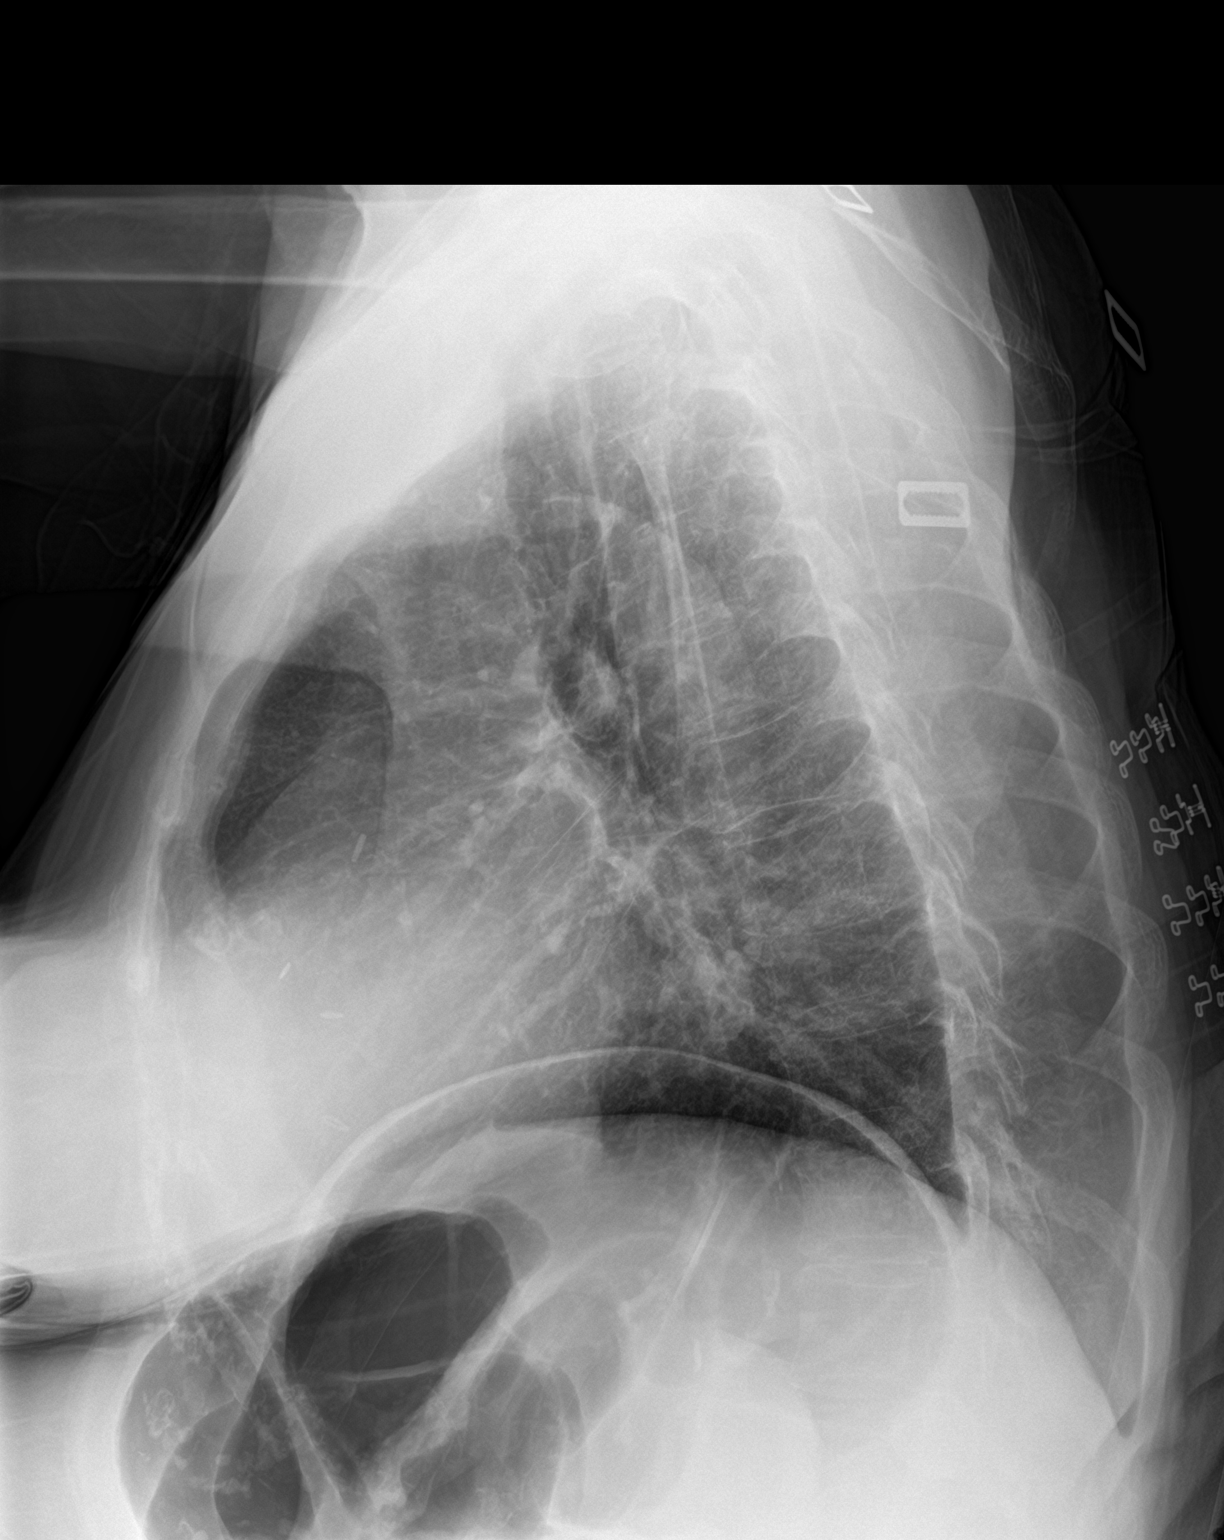
[im 3/3]
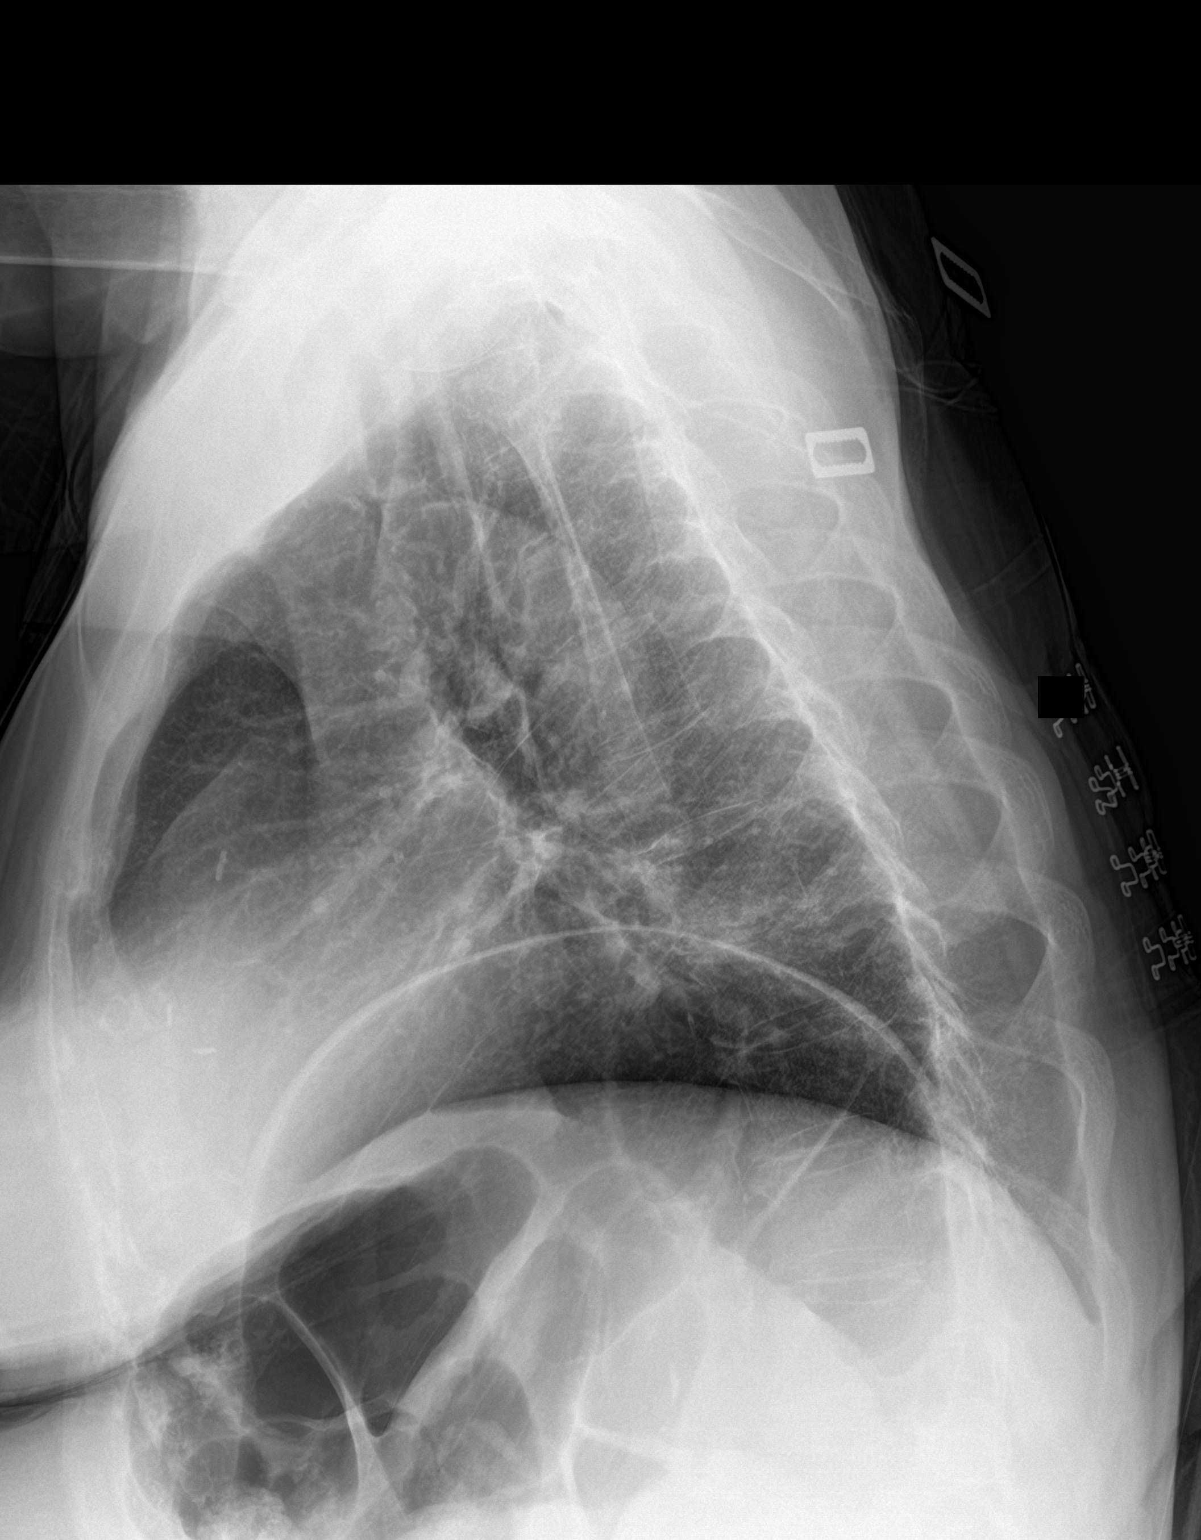

[3 of 3 positions shown; findings below may reference images not displayed]

FINDINGS: Cardiomediastinal silhouette unchanged in size and contour. No
evidence of central vascular congestion. No interlobular septal
thickening.

No pneumothorax or pleural effusion. Coarsened interstitial
markings, with no confluent airspace disease.

No acute displaced fracture. Degenerative changes of the spine.

Gaseous distention of subdiaphragmatic bowel/colon, unchanged from
the prior.
IMPRESSION: Chronic lung changes without evidence of acute cardiopulmonary
disease

## 2023-03-27 ENCOUNTER — Ambulatory Visit
Admission: EM | Admit: 2023-03-27 | Discharge: 2023-03-27 | Disposition: A | Discharge status: Home / Self Care | Payer: Medicare Other | Attending: Emergency Medicine | Admitting: Emergency Medicine

## 2023-03-27 DIAGNOSIS — N390 Urinary tract infection, site not specified: Secondary | ICD-10-CM | POA: Diagnosis present

## 2023-03-27 DIAGNOSIS — B9689 Other specified bacterial agents as the cause of diseases classified elsewhere: Secondary | ICD-10-CM

## 2023-03-27 DIAGNOSIS — N760 Acute vaginitis: Secondary | ICD-10-CM | POA: Diagnosis not present

## 2023-03-27 LAB — URINALYSIS, W/ REFLEX TO CULTURE (INFECTION SUSPECTED)
Bilirubin Urine: NEGATIVE
Glucose, UA: NEGATIVE mg/dL
Hgb urine dipstick: NEGATIVE
Ketones, ur: NEGATIVE mg/dL
Nitrite: NEGATIVE
Protein, ur: NEGATIVE mg/dL
Specific Gravity, Urine: 1.015 (ref 1.005–1.030)
pH: 7 (ref 5.0–8.0)

## 2023-03-27 LAB — WET PREP, GENITAL
Sperm: NONE SEEN
Trich, Wet Prep: NONE SEEN
WBC, Wet Prep HPF POC: >10 — AB (ref <10)
Yeast Wet Prep HPF POC: NONE SEEN

## 2023-03-27 MED ORDER — CEFUROXIME AXETIL 500 MG PO TABS: 500.0000 mg | ORAL_TABLET | Freq: Two times a day (BID) | ORAL | 0 refills | Status: AC

## 2023-03-27 MED ORDER — METRONIDAZOLE 500 MG PO TABS: 500.0000 mg | ORAL_TABLET | Freq: Two times a day (BID) | ORAL | 0 refills | Status: DC

## 2023-03-27 NOTE — Discharge Instructions (Addendum)
Change depends frequently, take antibiotics as directed. Drink plenty of water, recheck urine after meds completed. Go to Er for new or worsening issues(fever, nausea, vomiting, etc.)

## 2023-03-27 NOTE — ED Triage Notes (Signed)
Pt c/o Possible UTI  Pt caretaker said that she is incontinent and urinates on herself a lot.  Pt is having vaginal odor, urinary frequency, urinary odor, and has been "digging" at her vaginal area.

## 2023-03-27 NOTE — ED Provider Notes (Signed)
[facility]    CSN: [id number] Arrival date & time: 03/27/23  1654      History   Chief Complaint Chief Complaint  Patient presents with   Urinary Tract Infection    HPI Carly Collins is a 67 y.o. female.   67 year old female pt, Carly Collins, presents to urgent care for evaluation of UTI symptoms.  Patient is with [name], patient is incontinent, patient is having vaginal odor urinary frequency digging in her vaginal area per group facility noticed today.  Denies abdominal pain or fever  The history is provided by the patient. No language interpreter was used.    Past Medical History:  Diagnosis Date   Anxiety    C. difficile enteritis    Cancer (HCC)    Depression    Diabetes mellitus without complication (HCC)    GERD (gastroesophageal reflux disease)    Headache    Hyperlipidemia    Hypertension    Mental retardation, mild (I.Q. 50-70)    Moderate intellectual disabilities    Obesity    Recurrent UTI    Tibial fracture    Vitamin D deficiency     Patient Active Problem List   Diagnosis Date Noted   BV (bacterial vaginosis) 03/27/2023   Femoral distal fracture (HCC) 02/14/2022   Acute UTI 02/13/2022   Acute metabolic encephalopathy 02/13/2022   Intertriginous dermatitis associated with moisture 02/13/2022   COVID-19 virus infection 02/13/2022   Hypertension    Pain due to onychomycosis of toenails of both feet 12/14/2020   History of abnormal mammogram 01/28/2019   Recurrent UTI 01/28/2019   Chronic kidney disease (CKD), stage III (moderate) (HCC) 11/11/2018   Long-term use of high-risk medication 10/13/2018   Anxiety 07/31/2017   Anxiety and depression 10/16/2016   GERD (gastroesophageal reflux disease) 10/16/2016   Hyperlipidemia associated with type 2 diabetes mellitus (HCC) 08/29/2016   Moderate intellectual disabilities 11/15/2015   IDA (iron deficiency anemia) 09/22/2014   Closed fracture of distal end of left humerus  06/08/2014   Headache 11/30/2013   Breast cancer (HCC) 04/18/2013   History of Clostridium difficile 10/30/2012   Symptomatic cholelithiasis 10/26/2012   Closed fracture of condyle of femur (HCC) 10/05/2012   Closed fracture of proximal tibia 10/05/2012   Cognitive deficits 10/05/2012   Diabetes mellitus (HCC) 10/05/2012   Hypertension associated with diabetes (HCC) 10/05/2012   Hyperlipidemia, unspecified 10/05/2012   Obesity 10/05/2012   Vitamin D deficiency, unspecified 10/05/2012   Urinary tract infection 10/05/2012   Left tibial fracture 09/10/2012   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   Vitamin B12 deficiency 09/02/2000    Past Surgical History:  Procedure Laterality Date   ABDOMINAL SURGERY     APPENDECTOMY     BREAST SURGERY     COLON SURGERY     COLONOSCOPY N/A 10/26/2014   Procedure: COLONOSCOPY;  Surgeon: Christena Deem, MD;  Location: [facility] ENDOSCOPY;  Service: Endoscopy;  Laterality: N/A;   COLONOSCOPY N/A 01/21/2020   Procedure: COLONOSCOPY;  Surgeon: Earline Mayotte, MD;  Location: [facility] ENDOSCOPY;  Service: Endoscopy;  Laterality: N/A;   DILATION AND CURETTAGE OF UTERUS     ESOPHAGOGASTRODUODENOSCOPY N/A 01/21/2020   Procedure: ESOPHAGOGASTRODUODENOSCOPY (EGD);  Surgeon: Earline Mayotte, MD;  Location: [facility] ENDOSCOPY;  Service: Endoscopy;  Laterality: N/A;   ESOPHAGOGASTRODUODENOSCOPY (EGD) WITH PROPOFOL N/A 10/26/2014   Procedure: ESOPHAGOGASTRODUODENOSCOPY (EGD) WITH PROPOFOL;  Surgeon: Christena Deem, MD;  Location: [facility] ENDOSCOPY;  Service: Endoscopy;  Laterality: N/A;   FRACTURE SURGERY     ORIF FEMUR FRACTURE Left 02/15/2022   Procedure: OPEN REDUCTION INTERNAL FIXATION (ORIF) DISTAL FEMUR FRACTURE;  Surgeon: Christena Flake, MD;  Location: [facility] ORS;  Service: Orthopedics;  Laterality: Left;    OB History     Gravida  0   Para  0   Term  0   Preterm  0   AB  0   Living  0      SAB  0   IAB  0   Ectopic   0   Multiple  0   Live Births  0            Home Medications    Prior to Admission medications   Medication Sig Start Date End Date Taking? Authorizing Provider  alendronate (FOSAMAX) 70 MG tablet Take 70 mg by mouth every Saturday.   Yes [provider]  cefUROXime (CEFTIN) 500 MG tablet Take 1 tablet (500 mg total) by mouth 2 (two) times daily with a meal for 7 days. 03/27/23 04/03/23 Yes Loza Prell, Para March, NP  Cholecalciferol (VITAMIN D-1000 MAX ST) 25 MCG (1000 UT) tablet Take 1,000 Units by mouth.   Yes [provider]  clonazePAM (KLONOPIN) 1 MG tablet Take 0.5-1 mg by mouth See admin instructions. Take 1 tablet (1mg ) by mouth every morning and take  tablet (0.5mg ) by mouth every night at bedtime   Yes [provider]  divalproex (DEPAKOTE SPRINKLE) 125 MG capsule Take 250-375 mg by mouth See admin instructions. Take 2 capsules (250mg ) by mouth every morning, 2 capsules (250mg ) every day at noon then take 3 capsules (375mg ) by mouth every night at bedtime   Yes [provider]  donepezil (ARICEPT) 10 MG tablet Take 5 mg by mouth 2 (two) times daily.   Yes [provider]  DULoxetine (CYMBALTA) 30 MG capsule Take 30 mg by mouth daily. (Take with 60mg  capsule to equal 90mg  total)   Yes [provider]  ferrous gluconate (FERGON) 324 MG tablet Take 1 tablet (324 mg total) by mouth 2 (two) times daily with a meal. 02/22/22  Yes Arnetha Courser, MD  folic acid (FOLVITE) 400 MCG tablet Take 800 mcg by mouth daily.   Yes [provider]  hydrOXYzine (ATARAX) 25 MG tablet Take 25 mg by mouth 2 (two) times daily.   Yes [provider]  letrozole (FEMARA) 2.5 MG tablet Take 2.5 mg by mouth daily.   Yes [provider]  memantine (NAMENDA) 10 MG tablet Take 10 mg by mouth 2 (two) times daily.   Yes [provider]  metroNIDAZOLE (FLAGYL) 500 MG tablet Take 1 tablet (500 mg total) by mouth 2 (two) times  daily. 03/27/23  Yes Indigo Barbian, Para March, NP  mirtazapine (REMERON) 7.5 MG tablet Take 7.5 mg by mouth at bedtime.   Yes [provider]  nystatin (MYCOSTATIN/NYSTOP) powder Apply 1 Application topically every 12 (twelve) hours as needed (fungal symptoms in skin folds).   Yes [provider]  nystatin-triamcinolone (MYCOLOG II) cream Apply 1 Application topically 2 (two) times daily as needed (skin eruptions).   Yes [provider]  pantoprazole (PROTONIX) 40 MG tablet Take 1 tablet (40 mg total) by mouth in the morning. 03/08/22 03/27/23 Yes Phineas Semen, MD  phenylephrine-shark liver oil-mineral oil-petrolatum (PREPARATION H) 0.25-14-74.9 % rectal ointment Place 1 Application rectally daily as needed for hemorrhoids.   Yes [provider]  polyethylene glycol (MIRALAX / GLYCOLAX) packet Take  17 g by mouth at bedtime.   Yes [provider]  QUEtiapine (SEROQUEL) 300 MG tablet Take 300 mg by mouth at bedtime.   Yes [provider]  QUEtiapine (SEROQUEL) 50 MG tablet Take 50 mg by mouth 2 (two) times daily.   Yes [provider]  acetaminophen (MAPAP) 500 MG tablet Take 1,000 mg by mouth 3 (three) times daily.    [provider]  acetaminophen (TYLENOL) 325 MG tablet Take 325-650 mg by mouth every 6 (six) hours as needed for mild pain or moderate pain.    [provider]  brexpiprazole (REXULTI) 1 MG TABS tablet Take 1 mg by mouth in the morning.    [provider]  Dentifrices (BIOTENE DRY MOUTH) PSTE Place 1 Application onto teeth 3 (three) times daily.    [provider]  docusate sodium (COLACE) 100 MG capsule Take 100 mg by mouth 2 (two) times daily as needed for mild constipation.    [provider]  DULoxetine (CYMBALTA) 60 MG capsule Take 60 mg by mouth daily. (Take with 30mg  capsule to equal 90mg  total)    [provider]  enoxaparin (LOVENOX) 40 MG/0.4ML injection Inject 0.4  mLs (40 mg total) into the skin daily. 02/20/22   Anson Oregon, PA-C  guaiFENesin (DIABETIC TUSSIN EX) 100 MG/5ML liquid Take 10 mLs by mouth every 6 (six) hours as needed for cough or to loosen phlegm.    [provider]  HYDROcodone-acetaminophen (NORCO) 7.5-325 MG tablet Take 1 tablet by mouth every 6 (six) hours as needed for moderate pain or severe pain. 02/17/22   Evon Slack, PA-C  loperamide (IMODIUM) 2 MG capsule Take 2 mg by mouth as needed for diarrhea or loose stools.    [provider]  magnesium hydroxide (MILK OF MAGNESIA) 400 MG/5ML suspension Take 30 mLs by mouth daily as needed for mild constipation or moderate constipation.    [provider]  Mouthwashes (BIOTENE/CALCIUM PBF) LIQD Use as directed 15 mLs in the mouth or throat 2 (two) times daily.    [provider]  pseudoephedrine (SUDAFED) 30 MG tablet Take 30 mg by mouth every 6 (six) hours as needed for congestion.    [provider]  sitaGLIPtin (JANUVIA) 100 MG tablet Take 100 mg by mouth daily.  01/17/19  [provider]    Family History Family History  Problem Relation Age of Onset   Other Mother    Other Father     Social History Social History   Tobacco Use   Smoking status: Never   Smokeless tobacco: Never  Vaping Use   Vaping status: Never Used  Substance Use Topics   Alcohol use: No   Drug use: No     Allergies   Pioglitazone   Review of Systems Review of Systems  Constitutional:  Negative for fever.  Genitourinary:  Positive for dysuria, frequency and urgency.       Malodor  All other systems reviewed and are negative.    Physical Exam Triage Vital Signs ED Triage Vitals  Encounter Vitals Group     BP 03/27/23 1711 (!) 143/91     Systolic BP Percentile --      Diastolic BP Percentile --      Pulse Rate 03/27/23 1711 62     Resp --      Temp 03/27/23 1711 97.8 F (36.6 C)     Temp Source 03/27/23 1711 Oral     SpO2  03/27/23 1711 96 %     Weight 03/27/23 1706 132 lb (59.9 kg)     Height --      Head Circumference --      Peak Flow --      Pain Score 03/27/23 1706 0     Pain Loc --      Pain Education --      Exclude from Growth Chart --    No data found.  Updated Vital Signs BP (!) 143/91 (BP Location: Left Arm)   Pulse 62   Temp 97.8 F (36.6 C) (Oral)   Wt 132 lb (59.9 kg)   SpO2 96%   BMI 24.14 kg/m   Visual Acuity Right Eye Distance:   Left Eye Distance:   Bilateral Distance:    Right Eye Near:   Left Eye Near:    Bilateral Near:     Physical Exam Vitals and nursing note reviewed.  Constitutional:      General: She is not in acute distress.    Appearance: Normal appearance. She is well-developed and well-groomed.  HENT:     Head: Normocephalic.  Eyes:     Pupils: Pupils are equal, round, and reactive to light.  Cardiovascular:     Rate and Rhythm: Normal rate.  Pulmonary:     Effort: Pulmonary effort is normal.  Abdominal:     General: Bowel sounds are normal.     Tenderness: There is abdominal tenderness in the suprapubic area.  Musculoskeletal:        General: Normal range of motion.     Cervical back: Normal range of motion.  Skin:    General: Skin is warm and dry.  Neurological:     Mental Status: She is alert and oriented to person, place, and time.     GCS: GCS eye subscore is 4. GCS verbal subscore is 5. GCS motor subscore is 6.  Psychiatric:        Speech: Speech normal.        Behavior: Behavior normal. Behavior is cooperative.      UC Treatments / Results  Labs (all labs ordered are listed, but only abnormal results are displayed) Labs Reviewed  WET PREP, GENITAL - Abnormal; Notable for the following components:      Result Value   Clue Cells Wet Prep HPF POC PRESENT (*)    WBC, Wet Prep HPF POC >10 (*)    All other components within normal limits  URINALYSIS, W/ REFLEX TO CULTURE (INFECTION SUSPECTED) - Abnormal; Notable for the following  components:   Leukocytes,Ua LARGE (*)    Bacteria, UA FEW (*)    All other components within normal limits  URINE CULTURE    EKG   Radiology No results found.  Procedures Procedures (including critical care time)  Medications Ordered in UC Medications - No data to display  Initial Impression / Assessment and Plan / UC Course  I have reviewed the triage vital signs and the nursing notes.  Pertinent labs & imaging results that were available during my care of the patient were reviewed by me and considered in my medical decision making (see chart for details).  Clinical Course as of 03/27/23 1757  Thu Mar 27, 2023  1733 +clue cells will treat for BV [JD]    Clinical Course User Index [JD] Tyreck Bell, Para March, NP   Discussed exam findings and plan of care with patient guardian, strict go to ER precautions given.   Patient's guardian verbalized understanding to  this provider.  Ddx: Acute UTI, BV Final Clinical Impressions(s) / UC Diagnoses   Final diagnoses:  BV (bacterial vaginosis)  Acute UTI     Discharge Instructions      Change depends frequently, take antibiotics as directed. Drink plenty of water, recheck urine after meds completed. Go to Er for new or worsening issues(fever, nausea, vomiting, etc.)     ED Prescriptions     Medication Sig Dispense Auth. Provider   cefUROXime (CEFTIN) 500 MG tablet Take 1 tablet (500 mg total) by mouth 2 (two) times daily with a meal for 7 days. 14 tablet Tyresse Jayson, NP   metroNIDAZOLE (FLAGYL) 500 MG tablet Take 1 tablet (500 mg total) by mouth 2 (two) times daily. 14 tablet Montez Stryker, Para March, NP      PDMP not reviewed this encounter.   Clancy Gourd, NP 03/27/23 1757

## 2023-03-29 LAB — URINE CULTURE

## 2023-04-14 ENCOUNTER — Ambulatory Visit
Admission: EM | Admit: 2023-04-14 | Discharge: 2023-04-14 | Disposition: A | Discharge status: Home / Self Care | Payer: Medicare Other | Attending: Internal Medicine | Admitting: Internal Medicine

## 2023-04-14 ENCOUNTER — Encounter: Payer: Self-pay | Admitting: Emergency Medicine

## 2023-04-14 DIAGNOSIS — N3001 Acute cystitis with hematuria: Secondary | ICD-10-CM

## 2023-04-14 LAB — URINALYSIS, W/ REFLEX TO CULTURE (INFECTION SUSPECTED)
Bilirubin Urine: NEGATIVE
Glucose, UA: NEGATIVE mg/dL
Ketones, ur: NEGATIVE mg/dL
Nitrite: NEGATIVE
Protein, ur: NEGATIVE mg/dL
Specific Gravity, Urine: 1.02 (ref 1.005–1.030)
WBC, UA: >50 WBC/hpf (ref 0–5)
pH: 6 (ref 5.0–8.0)

## 2023-04-14 MED ORDER — CEPHALEXIN 500 MG PO CAPS
500.0000 mg | ORAL_CAPSULE | Freq: Two times a day (BID) | ORAL | 0 refills | Status: AC
Start: 2023-04-14 — End: 2023-04-21

## 2023-04-14 NOTE — ED Triage Notes (Signed)
Pt presents with her caretaker who states she has urinary frequency and complains about pain after urinating. Pt was seen 11/14 and she doesn't think she has been better since the last visit.

## 2023-04-14 NOTE — Discharge Instructions (Addendum)
Start Keflex twice daily for 7 days.  The clinical contact you with results of the urine culture done today if positive.  Increase fluids and rest.  Please follow-up with your PCP in 2 days for recheck.  Please go to the ER for any worsening symptoms.  Hope she feels better soon!

## 2023-04-14 NOTE — ED Provider Notes (Signed)
[facility]    CSN: [id number] Arrival date & time: 04/14/23  1640      History   Chief Complaint Chief Complaint  Patient presents with   Urinary Frequency   Dysuria    HPI Carly Collins is a 67 y.o. female presents for dysuria.  Patient is accompanied by caregiver.  Caregiver reports patient is reporting urinary burning, urgency, frequency.  Denies any fevers, nausea/vomiting, flank pain.  No vaginal discharge.  Patient was seen in urgent care on November 14 for UTI symptoms.  UA was positive for UTI and she was started on cefuroxime and also metronidazole for BV.  Urine culture showed multiple species present and recommend recollection but does not appear that patient or caregiver were contacted regarding this.  Caregiver does not think that the symptoms completely resolved with treatment.  States she has had to be treated more than once in the past for UTI.  Medications have been used since onset.  No other concerns at this time.   Urinary Frequency  Dysuria   Past Medical History:  Diagnosis Date   Anxiety    C. difficile enteritis    Cancer (HCC)    Depression    Diabetes mellitus without complication (HCC)    GERD (gastroesophageal reflux disease)    Headache    Hyperlipidemia    Hypertension    Mental retardation, mild (I.Q. 50-70)    Moderate intellectual disabilities    Obesity    Recurrent UTI    Tibial fracture    Vitamin D deficiency     Patient Active Problem List   Diagnosis Date Noted   BV (bacterial vaginosis) 03/27/2023   Femoral distal fracture (HCC) 02/14/2022   Acute UTI 02/13/2022   Acute metabolic encephalopathy 02/13/2022   Intertriginous dermatitis associated with moisture 02/13/2022   COVID-19 virus infection 02/13/2022   Hypertension    Pain due to onychomycosis of toenails of both feet 12/14/2020   History of abnormal mammogram 01/28/2019   Recurrent UTI 01/28/2019   Chronic kidney disease (CKD), stage III (moderate)  (HCC) 11/11/2018   Long-term use of high-risk medication 10/13/2018   Anxiety 07/31/2017   Anxiety and depression 10/16/2016   GERD (gastroesophageal reflux disease) 10/16/2016   Hyperlipidemia associated with type 2 diabetes mellitus (HCC) 08/29/2016   Moderate intellectual disabilities 11/15/2015   IDA (iron deficiency anemia) 09/22/2014   Closed fracture of distal end of left humerus 06/08/2014   Headache 11/30/2013   Breast cancer (HCC) 04/18/2013   History of Clostridium difficile 10/30/2012   Symptomatic cholelithiasis 10/26/2012   Closed fracture of condyle of femur (HCC) 10/05/2012   Closed fracture of proximal tibia 10/05/2012   Cognitive deficits 10/05/2012   Diabetes mellitus (HCC) 10/05/2012   Hypertension associated with diabetes (HCC) 10/05/2012   Hyperlipidemia, unspecified 10/05/2012   Obesity 10/05/2012   Vitamin D deficiency, unspecified 10/05/2012   Urinary tract infection 10/05/2012   Left tibial fracture 09/10/2012   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   Vitamin B12 deficiency 09/02/2000    Past Surgical History:  Procedure Laterality Date   ABDOMINAL SURGERY     APPENDECTOMY     BREAST SURGERY     COLON SURGERY     COLONOSCOPY N/A 10/26/2014   Procedure: COLONOSCOPY;  Surgeon: Christena Deem, MD;  Location: [facility] ENDOSCOPY;  Service: Endoscopy;  Laterality: N/A;   COLONOSCOPY N/A 01/21/2020   Procedure: COLONOSCOPY;  Surgeon: Earline Mayotte, MD;  Location: [facility] ENDOSCOPY;  Service:  Endoscopy;  Laterality: N/A;   DILATION AND CURETTAGE OF UTERUS     ESOPHAGOGASTRODUODENOSCOPY N/A 01/21/2020   Procedure: ESOPHAGOGASTRODUODENOSCOPY (EGD);  Surgeon: Earline Mayotte, MD;  Location: [facility] ENDOSCOPY;  Service: Endoscopy;  Laterality: N/A;   ESOPHAGOGASTRODUODENOSCOPY (EGD) WITH PROPOFOL N/A 10/26/2014   Procedure: ESOPHAGOGASTRODUODENOSCOPY (EGD) WITH PROPOFOL;  Surgeon: Christena Deem, MD;  Location: [facility] ENDOSCOPY;   Service: Endoscopy;  Laterality: N/A;   FRACTURE SURGERY     ORIF FEMUR FRACTURE Left 02/15/2022   Procedure: OPEN REDUCTION INTERNAL FIXATION (ORIF) DISTAL FEMUR FRACTURE;  Surgeon: Christena Flake, MD;  Location: [facility] ORS;  Service: Orthopedics;  Laterality: Left;    OB History     Gravida  0   Para  0   Term  0   Preterm  0   AB  0   Living  0      SAB  0   IAB  0   Ectopic  0   Multiple  0   Live Births  0            Home Medications    Prior to Admission medications   Medication Sig Start Date End Date Taking? Authorizing Provider  cephALEXin (KEFLEX) 500 MG capsule Take 1 capsule (500 mg total) by mouth 2 (two) times daily for 7 days. 04/14/23 04/21/23 Yes Radford Pax, NP  acetaminophen (MAPAP) 500 MG tablet Take 1,000 mg by mouth 3 (three) times daily.    [provider]  acetaminophen (TYLENOL) 325 MG tablet Take 325-650 mg by mouth every 6 (six) hours as needed for mild pain or moderate pain.    [provider]  alendronate (FOSAMAX) 70 MG tablet Take 70 mg by mouth every Saturday.    [provider]  brexpiprazole (REXULTI) 1 MG TABS tablet Take 1 mg by mouth in the morning.    [provider]  Cholecalciferol (VITAMIN D-1000 MAX ST) 25 MCG (1000 UT) tablet Take 1,000 Units by mouth.    [provider]  clonazePAM (KLONOPIN) 1 MG tablet Take 0.5-1 mg by mouth See admin instructions. Take 1 tablet (1mg ) by mouth every morning and take  tablet (0.5mg ) by mouth every night at bedtime    [provider]  Dentifrices (BIOTENE DRY MOUTH) PSTE Place 1 Application onto teeth 3 (three) times daily.    [provider]  divalproex (DEPAKOTE SPRINKLE) 125 MG capsule Take 250-375 mg by mouth See admin instructions. Take 2 capsules (250mg ) by mouth every morning, 2 capsules (250mg ) every day at noon then take 3 capsules (375mg ) by mouth every night at bedtime    [provider]  docusate sodium  (COLACE) 100 MG capsule Take 100 mg by mouth 2 (two) times daily as needed for mild constipation.    [provider]  donepezil (ARICEPT) 10 MG tablet Take 5 mg by mouth 2 (two) times daily.    [provider]  DULoxetine (CYMBALTA) 30 MG capsule Take 30 mg by mouth daily. (Take with 60mg  capsule to equal 90mg  total)    [provider]  DULoxetine (CYMBALTA) 60 MG capsule Take 60 mg by mouth daily. (Take with 30mg  capsule to equal 90mg  total)    [provider]  enoxaparin (LOVENOX) 40 MG/0.4ML injection Inject 0.4 mLs (40 mg total) into the skin daily. 02/20/22   Anson Oregon, PA-C  ferrous gluconate (FERGON) 324 MG tablet Take 1 tablet (324 mg total) by mouth 2 (two) times daily with a  meal. 02/22/22   Arnetha Courser, MD  folic acid (FOLVITE) 400 MCG tablet Take 800 mcg by mouth daily.    [provider]  guaiFENesin (DIABETIC TUSSIN EX) 100 MG/5ML liquid Take 10 mLs by mouth every 6 (six) hours as needed for cough or to loosen phlegm.    [provider]  HYDROcodone-acetaminophen (NORCO) 7.5-325 MG tablet Take 1 tablet by mouth every 6 (six) hours as needed for moderate pain or severe pain. 02/17/22   Evon Slack, PA-C  hydrOXYzine (ATARAX) 25 MG tablet Take 25 mg by mouth 2 (two) times daily.    [provider]  letrozole (FEMARA) 2.5 MG tablet Take 2.5 mg by mouth daily.    [provider]  loperamide (IMODIUM) 2 MG capsule Take 2 mg by mouth as needed for diarrhea or loose stools.    [provider]  magnesium hydroxide (MILK OF MAGNESIA) 400 MG/5ML suspension Take 30 mLs by mouth daily as needed for mild constipation or moderate constipation.    [provider]  memantine (NAMENDA) 10 MG tablet Take 10 mg by mouth 2 (two) times daily.    [provider]  metroNIDAZOLE (FLAGYL) 500 MG tablet Take 1 tablet (500 mg total) by mouth 2 (two) times daily. 03/27/23   Defelice, Para March, NP   mirtazapine (REMERON) 7.5 MG tablet Take 7.5 mg by mouth at bedtime.    [provider]  Mouthwashes (BIOTENE/CALCIUM PBF) LIQD Use as directed 15 mLs in the mouth or throat 2 (two) times daily.    [provider]  nystatin (MYCOSTATIN/NYSTOP) powder Apply 1 Application topically every 12 (twelve) hours as needed (fungal symptoms in skin folds).    [provider]  nystatin-triamcinolone (MYCOLOG II) cream Apply 1 Application topically 2 (two) times daily as needed (skin eruptions).    [provider]  pantoprazole (PROTONIX) 40 MG tablet Take 1 tablet (40 mg total) by mouth in the morning. 03/08/22 03/27/23  Phineas Semen, MD  phenylephrine-shark liver oil-mineral oil-petrolatum (PREPARATION H) 0.25-14-74.9 % rectal ointment Place 1 Application rectally daily as needed for hemorrhoids.    [provider]  polyethylene glycol (MIRALAX / GLYCOLAX) packet Take 17 g by mouth at bedtime.    [provider]  pseudoephedrine (SUDAFED) 30 MG tablet Take 30 mg by mouth every 6 (six) hours as needed for congestion.    [provider]  QUEtiapine (SEROQUEL) 300 MG tablet Take 300 mg by mouth at bedtime.    [provider]  QUEtiapine (SEROQUEL) 50 MG tablet Take 50 mg by mouth 2 (two) times daily.    [provider]  sitaGLIPtin (JANUVIA) 100 MG tablet Take 100 mg by mouth daily.  01/17/19  [provider]    Family History Family History  Problem Relation Age of Onset   Other Mother    Other Father     Social History Social History   Tobacco Use   Smoking status: Never   Smokeless tobacco: Never  Vaping Use   Vaping status: Never Used  Substance Use Topics   Alcohol use: No   Drug use: No     Allergies   Pioglitazone   Review of Systems Review of Systems  Genitourinary:  Positive for dysuria and frequency.     Physical Exam Triage Vital Signs ED Triage Vitals [04/14/23 1651]  Encounter  Vitals Group     BP 113/71     Systolic BP Percentile      Diastolic BP Percentile  Pulse Rate 64     Resp 16     Temp 98.2 F (36.8 C)     Temp src      SpO2      Weight      Height      Head Circumference      Peak Flow      Pain Score      Pain Loc      Pain Education      Exclude from Growth Chart    No data found.  Updated Vital Signs BP 113/71 (BP Location: Left Arm)   Pulse 64   Temp 98.2 F (36.8 C)   Resp 16   Visual Acuity Right Eye Distance:   Left Eye Distance:   Bilateral Distance:    Right Eye Near:   Left Eye Near:    Bilateral Near:     Physical Exam Vitals and nursing note reviewed.  Constitutional:      Appearance: Normal appearance.  HENT:     Head: Normocephalic and atraumatic.  Eyes:     Pupils: Pupils are equal, round, and reactive to light.  Cardiovascular:     Rate and Rhythm: Normal rate.  Pulmonary:     Effort: Pulmonary effort is normal.  Abdominal:     Tenderness: There is no right CVA tenderness or left CVA tenderness.  Skin:    General: Skin is warm and dry.  Neurological:     General: No focal deficit present.     Mental Status: She is alert and oriented to person, place, and time.  Psychiatric:        Mood and Affect: Mood normal.        Behavior: Behavior normal.      UC Treatments / Results  Labs (all labs ordered are listed, but only abnormal results are displayed) Labs Reviewed  URINALYSIS, W/ REFLEX TO CULTURE (INFECTION SUSPECTED) - Abnormal; Notable for the following components:      Result Value   APPearance HAZY (*)    Hgb urine dipstick TRACE (*)    Leukocytes,Ua MODERATE (*)    Bacteria, UA FEW (*)    All other components within normal limits  URINE CULTURE    EKG   Radiology No results found.  Procedures Procedures (including critical care time)  Medications Ordered in UC Medications - No data to display  Initial Impression / Assessment and Plan / UC Course  I have reviewed the  triage vital signs and the nursing notes.  Pertinent labs & imaging results that were available during my care of the patient were reviewed by me and considered in my medical decision making (see chart for details).     Positive for UTI, will start Keflex and reculture.  Advised fluids and rest.  PCP follow-up 2 days for recheck.  ER precautions reviewed. Final Clinical Impressions(s) / UC Diagnoses   Final diagnoses:  Acute cystitis with hematuria     Discharge Instructions      Start Keflex twice daily for 7 days.  The clinical contact you with results of the urine culture done today if positive.  Increase fluids and rest.  Please follow-up with your PCP in 2 days for recheck.  Please go to the ER for any worsening symptoms.  Hope she feels better soon!     ED Prescriptions     Medication Sig Dispense Auth. Provider   cephALEXin (KEFLEX) 500 MG capsule Take 1 capsule (500 mg total) by mouth 2 (  two) times daily for 7 days. 14 capsule Radford Pax, NP      PDMP not reviewed this encounter.   Radford Pax, NP 04/14/23 [phone]

## 2023-04-15 ENCOUNTER — Other Ambulatory Visit: Payer: Self-pay

## 2023-04-15 ENCOUNTER — Encounter: Payer: Self-pay | Admitting: Emergency Medicine

## 2023-04-15 ENCOUNTER — Emergency Department
Admission: EM | Admit: 2023-04-15 | Discharge: 2023-04-15 | Disposition: A | Discharge status: Home / Self Care | Payer: Medicare Other | Attending: Emergency Medicine | Admitting: Emergency Medicine

## 2023-04-15 ENCOUNTER — Emergency Department: Payer: Medicare Other

## 2023-04-15 DIAGNOSIS — R300 Dysuria: Secondary | ICD-10-CM | POA: Diagnosis present

## 2023-04-15 DIAGNOSIS — E119 Type 2 diabetes mellitus without complications: Secondary | ICD-10-CM | POA: Diagnosis not present

## 2023-04-15 DIAGNOSIS — E860 Dehydration: Secondary | ICD-10-CM | POA: Diagnosis not present

## 2023-04-15 DIAGNOSIS — N390 Urinary tract infection, site not specified: Secondary | ICD-10-CM | POA: Insufficient documentation

## 2023-04-15 DIAGNOSIS — Z859 Personal history of malignant neoplasm, unspecified: Secondary | ICD-10-CM | POA: Insufficient documentation

## 2023-04-15 DIAGNOSIS — I10 Essential (primary) hypertension: Secondary | ICD-10-CM | POA: Insufficient documentation

## 2023-04-15 LAB — CBC WITH DIFFERENTIAL/PLATELET
Abs Immature Granulocytes: 0.03 10*3/uL (ref 0.00–0.07)
Basophils Absolute: 0 10*3/uL (ref 0.0–0.1)
Basophils Relative: 0 %
Eosinophils Absolute: 0 10*3/uL (ref 0.0–0.5)
Eosinophils Relative: 1 %
HCT: 38.6 % (ref 36.0–46.0)
Hemoglobin: 12.7 g/dL (ref 12.0–15.0)
Immature Granulocytes: 1 %
Lymphocytes Relative: 28 %
Lymphs Abs: 1.8 10*3/uL (ref 0.7–4.0)
MCH: 29.3 pg (ref 26.0–34.0)
MCHC: 32.9 g/dL (ref 30.0–36.0)
MCV: 89.1 fL (ref 80.0–100.0)
Monocytes Absolute: 0.5 10*3/uL (ref 0.1–1.0)
Monocytes Relative: 8 %
Neutro Abs: 4 10*3/uL (ref 1.7–7.7)
Neutrophils Relative %: 62 %
Platelets: 232 10*3/uL (ref 150–400)
RBC: 4.33 MIL/uL (ref 3.87–5.11)
RDW: 14.9 % (ref 11.5–15.5)
WBC: 6.4 10*3/uL (ref 4.0–10.5)
nRBC: 0 % (ref 0.0–0.2)

## 2023-04-15 LAB — COMPREHENSIVE METABOLIC PANEL
ALT: 12 U/L (ref 0–44)
AST: 18 U/L (ref 15–41)
Albumin: 3.5 g/dL (ref 3.5–5.0)
Alkaline Phosphatase: 77 U/L (ref 38–126)
Anion gap: 8 (ref 5–15)
BUN: 32 mg/dL — ABNORMAL HIGH (ref 8–23)
CO2: 31 mmol/L (ref 22–32)
Calcium: 9 mg/dL (ref 8.9–10.3)
Chloride: 100 mmol/L (ref 98–111)
Creatinine, Ser: 0.82 mg/dL (ref 0.44–1.00)
GFR, Estimated: >60 mL/min (ref >60)
Glucose, Bld: 64 mg/dL — ABNORMAL LOW (ref 70–99)
Potassium: 4.7 mmol/L (ref 3.5–5.1)
Sodium: 139 mmol/L (ref 135–145)
Total Bilirubin: 0.4 mg/dL (ref <1.2)
Total Protein: 7.3 g/dL (ref 6.5–8.1)

## 2023-04-15 LAB — URINE CULTURE

## 2023-04-15 LAB — LACTIC ACID, PLASMA: Lactic Acid, Venous: 1.2 mmol/L (ref 0.5–1.9)

## 2023-04-15 MED ORDER — SODIUM CHLORIDE 0.9 % IV BOLUS
1000.0000 mL | Freq: Once | INTRAVENOUS | Status: AC
  Administered 2023-04-15: 1000 mL via INTRAVENOUS

## 2023-04-15 MED ORDER — SODIUM CHLORIDE 0.9 % IV SOLN
1.0000 g | Freq: Once | INTRAVENOUS | Status: AC
  Administered 2023-04-15: 1 g via INTRAVENOUS
  Filled 2023-04-15: qty 10

## 2023-04-15 NOTE — ED Notes (Signed)
Patient was given apple sauce per Bridget Hartshorn, PA-C. Patient c/o being hungry. Caregiver is at bedside feeding the patient.

## 2023-04-15 NOTE — ED Triage Notes (Signed)
Pt arrives via ems from [facility], pt went to urgent care last pm c/o pain with urination, caregiver uncertain if they started her back on antibiotics last pm, states that she just had a uti a couple weeks ago

## 2023-04-15 NOTE — ED Notes (Signed)
Patient taken to xray.

## 2023-04-15 NOTE — ED Notes (Signed)
Two unsuccessful IV attempts. PA-C aware. Patient is drinking water from the cup. Caregiver states patient is on a puree diet, but can drink thin liquids. Patient had a chocolate pudding as a morning snack per caregiver. Caregiver states patient is at her baseline now.

## 2023-04-15 NOTE — ED Provider Notes (Signed)
[facility] Provider Note    Event Date/Time   First MD Initiated Contact with Patient 04/15/23 1113     (approximate)   History   Dysuria   HPI  Carly Collins is a 67 y.o. female   is brought to the ED by caregiver from [facility] with concerns of an episode that occurred this morning.  Caregiver reports that they recorded her O2 sat as 49 but then rechecked it and it was slightly higher.  There is no history of upper respiratory symptoms.  She was seen at [facility] urgent care last evening in which they were told that she had a urinary tract infection but it does not appear that she had any antibiotics prior to discharge and the caregiver is unaware that she is had any antibiotics since her diagnosis.  She does have a history of UTIs.  No fever, nausea or vomiting is noted.  Patient is a poor historian.  Patient has history of hypertension, diabetes, mental retardation, recurrent UTIs, GERD, C. difficile, depression, vitamin D deficiency, cancer.      Physical Exam   Triage Vital Signs: ED Triage Vitals  Encounter Vitals Group     BP 04/15/23 1101 (!) 149/69     Systolic BP Percentile --      Diastolic BP Percentile --      Pulse Rate 04/15/23 1101 84     Resp 04/15/23 1101 16     Temp 04/15/23 1101 (!) 97.5 F (36.4 C)     Temp Source 04/15/23 1101 Oral     SpO2 04/15/23 1101 92 %     Weight 04/15/23 1059 131 lb 13.4 oz (59.8 kg)     Height --      Head Circumference --      Peak Flow --      Pain Score 04/15/23 1059 0     Pain Loc --      Pain Education --      Exclude from Growth Chart --     Most recent vital signs: Vitals:   04/15/23 1101 04/15/23 1300  BP: (!) 149/69 (!) 154/77  Pulse: 84 (!) 110  Resp: 16   Temp: (!) 97.5 F (36.4 C)   SpO2: 92% 93%     General: Awake, no distress.  Alert, cooperative, answers simple questions. CV:  Good peripheral perfusion.  Heart rate and rate rhythm. Resp:  Normal effort.  Clear  bilaterally. Abd:  No distention.  Soft, nontender. Other:     ED Results / Procedures / Treatments   Labs (all labs ordered are listed, but only abnormal results are displayed) Labs Reviewed  COMPREHENSIVE METABOLIC PANEL - Abnormal; Notable for the following components:      Result Value   Glucose, Bld 64 (*)    BUN 32 (*)    All other components within normal limits  CBC WITH DIFFERENTIAL/PLATELET  LACTIC ACID, PLASMA  LACTIC ACID, PLASMA  URINALYSIS, ROUTINE W REFLEX MICROSCOPIC      RADIOLOGY Chest x-ray images were reviewed by myself independent of the radiologist and was negative for infiltrate.  Official radiology report shows bilateral patchy opacities most likely atelectasis.    PROCEDURES:  Critical Care performed:   Procedures   MEDICATIONS ORDERED IN ED: Medications  sodium chloride 0.9 % bolus 1,000 mL (1,000 mLs Intravenous New Bag/Given 04/15/23 1343)  cefTRIAXone (ROCEPHIN) 1 g in sodium chloride 0.9 % 100 mL IVPB (0 g Intravenous Stopped 04/15/23 1416)  IMPRESSION / MDM / ASSESSMENT AND PLAN / ED COURSE  I reviewed the triage vital signs and the nursing notes.   Differential diagnosis includes, but is not limited to, urinary tract infection, pneumonia, sepsis, dehydration.  67 year old female presents to the ED via care taker with history of a urinary tract infection that was noted at urgent care last evening however patient was not given any antibiotics last evening and apparently has not gotten the prescription today.  Patient continues to eat and drink.  Urinalysis report was visible from [place] urgent care last evening with greater than 50,000 WBCs and bacteria.  Culture is pending.  BUN was 32 and glucose 64.  Chest x-ray as noted above.  Patient reports that she has not eaten today.  She was given applesauce and ate 2 ice creams.  IV fluids and Rocephin IV was given while in the ED.  Lactic acid was 1.2.  Strongly encouraged follow-up with her  PCP for the results of her urine culture and also begin taking the cephalexin that was prescribed for her last evening.  Encouraged frequent offering of fluids to drink.  Return to the emergency department if any severe worsening of her symptoms such as nausea, vomiting, fever, chills or inability to take the oral antibiotics.      Patient's presentation is most consistent with acute illness / injury with system symptoms.  FINAL CLINICAL IMPRESSION(S) / ED DIAGNOSES   Final diagnoses:  Acute urinary tract infection  Dehydration     Rx / DC Orders   ED Discharge Orders     None        Note:  This document was prepared using Dragon voice recognition software and may include unintentional dictation errors.   Tommi Rumps, PA-C 04/15/23 1451    Jene Every, MD 04/15/23 1455

## 2023-04-15 NOTE — Discharge Instructions (Addendum)
Follow-up with your primary care provider if any continued problems or concerns.  Also begin giving the cephalexin that was prescribed for her yesterday until completely finished.  Antibiotics were given to her today through her IV.  Encouraged her to drink fluids frequently to stay hydrated which may help prevent future urinary tract infections.  Return to the emergency department if any severe worsening of her symptoms such as nausea, vomiting, fever, chills or decreased p.o. intake.

## 2023-04-15 NOTE — ED Notes (Signed)
Caregivers at bedside and were given report.

## 2023-04-15 NOTE — ED Triage Notes (Signed)
First nurse note: Pt to ED [facility] Carly Collins for possible UTI, states not on antibiotics, seen at [facility] yesterday. Also reports increased tremors.

## 2023-04-23 DIAGNOSIS — N952 Postmenopausal atrophic vaginitis: Secondary | ICD-10-CM | POA: Insufficient documentation

## 2023-05-01 ENCOUNTER — Encounter: Payer: Self-pay | Admitting: Podiatry

## 2023-05-01 ENCOUNTER — Ambulatory Visit (INDEPENDENT_AMBULATORY_CARE_PROVIDER_SITE_OTHER): Payer: Medicare Other | Admitting: Podiatry

## 2023-05-01 DIAGNOSIS — N183 Chronic kidney disease, stage 3 unspecified: Secondary | ICD-10-CM

## 2023-05-01 DIAGNOSIS — M79674 Pain in right toe(s): Secondary | ICD-10-CM | POA: Diagnosis not present

## 2023-05-01 DIAGNOSIS — B351 Tinea unguium: Secondary | ICD-10-CM | POA: Diagnosis not present

## 2023-05-01 DIAGNOSIS — M79675 Pain in left toe(s): Secondary | ICD-10-CM

## 2023-05-10 NOTE — Progress Notes (Unsigned)
  Subjective:  Patient ID: Carly Collins, female    DOB: 1955-09-09,  MRN: [id number]  67 y.o. female presents painful elongated mycotic toenails 1-5 bilaterally which are tender when wearing enclosed shoe gear. Pain is relieved with periodic professional debridement.  New problem(s): None   PCP is Conan Bowens., MD , and last visit was April 23, 2023.  Allergies  Allergen Reactions   Pioglitazone Other (See Comments)    unknown  Unknown to patient    Review of Systems: Negative except as noted in the HPI.   Objective:  Carly Collins is a pleasant 67 y.o. female WD, WN in NAD. AAO x 3.  Vascular Examination: Vascular status intact b/l with palpable pedal pulses. CFT immediate b/l. Pedal hair present. No edema. No pain with calf compression b/l. Skin temperature gradient WNL b/l. No varicosities noted. No cyanosis or clubbing noted.  Neurological Examination: Sensation grossly intact b/l with 10 gram monofilament. Vibratory sensation intact b/l.  Dermatological Examination: Pedal skin with normal turgor, texture and tone b/l. No open wounds nor interdigital macerations noted. Toenails 1-5 b/l thick, discolored, elongated with subungual debris and pain on dorsal palpation. No hyperkeratotic lesions noted b/l.   Musculoskeletal Examination: Muscle strength 5/5 to b/l LE.  No pain, crepitus noted b/l. No gross pedal deformities. Patient ambulates independently without assistive aids.   Radiographs: None  Last A1c:       No data to display          Assessment:   1. Pain due to onychomycosis of toenails of both feet   2. Stage 3 chronic kidney disease, unspecified whether stage 3a or 3b CKD (HCC)     Plan:  {[id number]::"-Patient/POA to call should there be question/concern in the interim."}  Return in about 3 months (around 07/30/2023).  Freddie Breech, DPM      [place] LOCATION: [address], [place] [address]                   Office [phone]   [facility] LOCATION: [address] [place], [place] [address] Office [phone]

## 2023-05-26 ENCOUNTER — Emergency Department
Admission: EM | Admit: 2023-05-26 | Discharge: 2023-05-26 | Disposition: A | Discharge status: Home / Self Care | Payer: Medicare Other | Attending: Emergency Medicine | Admitting: Emergency Medicine

## 2023-05-26 ENCOUNTER — Emergency Department: Payer: Medicare Other

## 2023-05-26 ENCOUNTER — Other Ambulatory Visit: Payer: Self-pay

## 2023-05-26 DIAGNOSIS — M25562 Pain in left knee: Secondary | ICD-10-CM

## 2023-05-26 DIAGNOSIS — S0093XA Contusion of unspecified part of head, initial encounter: Secondary | ICD-10-CM

## 2023-05-26 DIAGNOSIS — M545 Low back pain, unspecified: Secondary | ICD-10-CM

## 2023-05-26 DIAGNOSIS — W0110XA Fall on same level from slipping, tripping and stumbling with subsequent striking against unspecified object, initial encounter: Secondary | ICD-10-CM | POA: Diagnosis not present

## 2023-05-26 DIAGNOSIS — M25561 Pain in right knee: Secondary | ICD-10-CM | POA: Diagnosis not present

## 2023-05-26 DIAGNOSIS — W19XXXA Unspecified fall, initial encounter: Secondary | ICD-10-CM

## 2023-05-26 MED ORDER — ACETAMINOPHEN 500 MG PO TABS: 1000.0000 mg | ORAL_TABLET | Freq: Once | ORAL | Status: DC

## 2023-05-26 NOTE — ED Triage Notes (Signed)
 Per caregiver pt fell earlier today while taking a shower, pt c/o bilateral knees and lower back pain. Pt is not on blood thinners, but did hit her head.

## 2023-05-26 NOTE — ED Notes (Signed)
 First nurse note-pt brought in  via ems from a group home  pt had mechanical fall getting out of shower today.  No loc.  Pt has left knee pain.  V signs wnl per ems.  Cgb 107 per ems.  Hx dementia

## 2023-05-26 NOTE — ED Provider Notes (Signed)
 [facility] Provider Note   Event Date/Time   First MD Initiated Contact with Patient 05/26/23 2123     (approximate) History  Fall  HPI Carly Collins is a 68 y.o. female who presents after a mechanical fall complaining of bilateral knee pain, low back pain, and striking her head.  Denies any loss of consciousness.  Patient has been complaining of significant bilateral knee pain since this injury per patient's caregiver at bedside. ROS: Patient currently denies any vision changes, tinnitus, difficulty speaking, facial droop, sore throat, chest pain, shortness of breath, abdominal pain, nausea/vomiting/diarrhea, dysuria, or weakness/numbness/paresthesias in any extremity   Physical Exam  Triage Vital Signs: ED Triage Vitals  Encounter Vitals Group     BP 05/26/23 1919 (!) 173/74     Systolic BP Percentile --      Diastolic BP Percentile --      Pulse Rate 05/26/23 1919 88     Resp 05/26/23 1919 18     Temp 05/26/23 1919 97.9 F (36.6 C)     Temp Source 05/26/23 1919 Oral     SpO2 05/26/23 1919 98 %     Weight 05/26/23 1918 160 lb (72.6 kg)     Height 05/26/23 1918 5' 6 (1.676 m)     Head Circumference --      Peak Flow --      Pain Score --      Pain Loc --      Pain Education --      Exclude from Growth Chart --    Most recent vital signs: Vitals:   05/26/23 1919  BP: (!) 173/74  Pulse: 88  Resp: 18  Temp: 97.9 F (36.6 C)  SpO2: 98%   General: Awake, oriented x4. CV:  Good peripheral perfusion.  Resp:  Normal effort.  Abd:  No distention.  Other:  Middle-aged overweight Caucasian female resting comfortably in no acute distress.  Erythema over bilateral anterior knees ED Results / Procedures / Treatments  Labs (all labs ordered are listed, but only abnormal results are displayed) Labs Reviewed - No data to display RADIOLOGY ED MD interpretation: X-ray of the lumbar spine shows mild levoscoliosis with degenerative changes at L3/L4  CT  of the head without contrast interpreted by me shows no evidence of acute abnormalities including no intracerebral hemorrhage, obvious masses, or significant edema CT of the cervical spine interpreted by me does not show any evidence of acute abnormalities including no acute fracture, malalignment, height loss, or dislocation -Agree with radiology assessment Official radiology report(s): DG Lumbar Spine Complete Result Date: 05/26/2023 CLINICAL DATA:  Low back pain with fall EXAM: LUMBAR SPINE - COMPLETE 4+ VIEW COMPARISON:  CT 03/08/2022 FINDINGS: Mild levoscoliosis of the lower lumbar spine. Vertebral body heights are grossly maintained. Advanced disc space narrowing and degenerative change L3-L4 with moderate degenerative changes at L4-L5. Facet degenerative changes of the lower lumbar spine. IMPRESSION: Mild levoscoliosis with degenerative changes most advanced at L3-L4. No definite acute osseous abnormality Electronically Signed   By: Luke Bun M.D.   On: 05/26/2023 20:57   CT Head Wo Contrast Result Date: 05/26/2023 CLINICAL DATA:  Head trauma, minor (Age >= 65y); Neck trauma (Age >= 65y). Fall with headache, back pain. EXAM: CT HEAD WITHOUT CONTRAST CT CERVICAL SPINE WITHOUT CONTRAST TECHNIQUE: Multidetector CT imaging of the head and cervical spine was performed following the standard protocol without intravenous contrast. Multiplanar CT image reconstructions of the cervical spine were also generated. RADIATION  DOSE REDUCTION: This exam was performed according to the departmental dose-optimization program which includes automated exposure control, adjustment of the mA and/or kV according to patient size and/or use of iterative reconstruction technique. COMPARISON:  Head CT 06/11/2022. FINDINGS: CT HEAD FINDINGS Brain: No acute intracranial hemorrhage. Stable background of mild chronic small-vessel disease. Gray-white differentiation is preserved. No hydrocephalus or extra-axial collection. No  mass effect or midline shift. Vascular: No hyperdense vessel or unexpected calcification. Skull: No calvarial fracture or suspicious bone lesion. Skull base is unremarkable. Sinuses/Orbits: No acute finding. Other: None. CT CERVICAL SPINE FINDINGS Alignment: No traumatic malalignment. Degenerative and/or positional reversal of the normal cervical lordosis. Skull base and vertebrae: No acute fracture. Normal craniocervical junction. No suspicious bone lesions. Soft tissues and spinal canal: No prevertebral fluid or swelling. No visible canal hematoma. Disc levels: Multilevel cervical spondylosis, worst at C3-4 and C5-6, where there is at least mild spinal canal stenosis. Upper chest: No acute findings. Other: None. IMPRESSION: 1. No acute intracranial abnormality. Stable background of mild chronic small-vessel disease. 2. No acute cervical spine fracture or traumatic malalignment. Electronically Signed   By: Ryan Chess M.D.   On: 05/26/2023 20:32   CT Cervical Spine Wo Contrast Result Date: 05/26/2023 CLINICAL DATA:  Head trauma, minor (Age >= 65y); Neck trauma (Age >= 65y). Fall with headache, back pain. EXAM: CT HEAD WITHOUT CONTRAST CT CERVICAL SPINE WITHOUT CONTRAST TECHNIQUE: Multidetector CT imaging of the head and cervical spine was performed following the standard protocol without intravenous contrast. Multiplanar CT image reconstructions of the cervical spine were also generated. RADIATION DOSE REDUCTION: This exam was performed according to the departmental dose-optimization program which includes automated exposure control, adjustment of the mA and/or kV according to patient size and/or use of iterative reconstruction technique. COMPARISON:  Head CT 06/11/2022. FINDINGS: CT HEAD FINDINGS Brain: No acute intracranial hemorrhage. Stable background of mild chronic small-vessel disease. Gray-white differentiation is preserved. No hydrocephalus or extra-axial collection. No mass effect or midline shift.  Vascular: No hyperdense vessel or unexpected calcification. Skull: No calvarial fracture or suspicious bone lesion. Skull base is unremarkable. Sinuses/Orbits: No acute finding. Other: None. CT CERVICAL SPINE FINDINGS Alignment: No traumatic malalignment. Degenerative and/or positional reversal of the normal cervical lordosis. Skull base and vertebrae: No acute fracture. Normal craniocervical junction. No suspicious bone lesions. Soft tissues and spinal canal: No prevertebral fluid or swelling. No visible canal hematoma. Disc levels: Multilevel cervical spondylosis, worst at C3-4 and C5-6, where there is at least mild spinal canal stenosis. Upper chest: No acute findings. Other: None. IMPRESSION: 1. No acute intracranial abnormality. Stable background of mild chronic small-vessel disease. 2. No acute cervical spine fracture or traumatic malalignment. Electronically Signed   By: Ryan Chess M.D.   On: 05/26/2023 20:32   PROCEDURES: Critical Care performed: No Procedures MEDICATIONS ORDERED IN ED: Medications  acetaminophen  (TYLENOL ) tablet 1,000 mg (1,000 mg Oral Patient Refused/Not Given 05/26/23 2210)   IMPRESSION / MDM / ASSESSMENT AND PLAN / ED COURSE  I reviewed the triage vital signs and the nursing notes.                             The patient is on the cardiac monitor to evaluate for evidence of arrhythmia and/or significant heart rate changes. Patient's presentation is most consistent with acute presentation with potential threat to life or bodily function. Presenting after a fall that occurred just prior to arrival, resulting in injury to  the head, lower back, and bilateral knees. The mechanism of injury was a mechanical ground level fall without syncope or near-syncope. The current level of pain is moderate. There was no loss of consciousness, confusion, seizure, or memory impairment. There is not a laceration associated with the injury. Denies neck pain. The patient does not take  blood thinner medications. Denies vomiting, numbness/weakness, fever  Dispo: Discharge with PCP follow-up       FINAL CLINICAL IMPRESSION(S) / ED DIAGNOSES   Final diagnoses:  Fall, initial encounter  Contusion of head, unspecified part of head, initial encounter  Acute pain of both knees  Acute bilateral low back pain without sciatica   Rx / DC Orders   ED Discharge Orders     None      Note:  This document was prepared using Dragon voice recognition software and may include unintentional dictation errors.   Pairlee Sawtell K, MD 05/26/23 [phone]

## 2023-05-26 NOTE — ED Provider Triage Note (Signed)
 Emergency Medicine Provider Triage Evaluation Note  Carly Collins , a 68 y.o. female  was evaluated in triage.  Pt complains of falling in the restroom. Patient states headache, back pain, knee pain.   Review of Systems  Positive:  Negative:   Physical Exam  BP (!) 173/74   Pulse 88   Temp 97.9 F (36.6 C) (Oral)   Resp 18   Ht 5' 6 (1.676 m)   Wt 72.6 kg   SpO2 98%   BMI 25.82 kg/m  Gen:   Awake, no distress   Resp:  Normal effort  MSK:   Moves extremities without difficulty  Other:  Head: no tender to palpation  Medical Decision Making  Medically screening exam initiated at 7:22 PM.  Appropriate orders placed.  Carly Collins was informed that the remainder of the evaluation will be completed by another provider, this initial triage assessment does not replace that evaluation, and the importance of remaining in the ED until their evaluation is complete.  Patient with trauma, ordered head ct, cervical ct, pelvis XR   Carly Kast, PA-C 05/26/23 1924

## 2023-05-29 ENCOUNTER — Other Ambulatory Visit: Payer: Self-pay

## 2023-05-29 ENCOUNTER — Emergency Department
Admission: EM | Admit: 2023-05-29 | Discharge: 2023-05-29 | Disposition: A | Discharge status: Home / Self Care | Payer: Medicare Other | Attending: Emergency Medicine | Admitting: Emergency Medicine

## 2023-05-29 ENCOUNTER — Emergency Department: Payer: Medicare Other

## 2023-05-29 DIAGNOSIS — Z1152 Encounter for screening for COVID-19: Secondary | ICD-10-CM | POA: Diagnosis not present

## 2023-05-29 DIAGNOSIS — K5900 Constipation, unspecified: Secondary | ICD-10-CM

## 2023-05-29 DIAGNOSIS — R1031 Right lower quadrant pain: Secondary | ICD-10-CM | POA: Diagnosis not present

## 2023-05-29 DIAGNOSIS — E119 Type 2 diabetes mellitus without complications: Secondary | ICD-10-CM | POA: Insufficient documentation

## 2023-05-29 DIAGNOSIS — R410 Disorientation, unspecified: Secondary | ICD-10-CM | POA: Diagnosis present

## 2023-05-29 DIAGNOSIS — R1032 Left lower quadrant pain: Secondary | ICD-10-CM | POA: Diagnosis not present

## 2023-05-29 DIAGNOSIS — R4182 Altered mental status, unspecified: Secondary | ICD-10-CM | POA: Insufficient documentation

## 2023-05-29 DIAGNOSIS — I10 Essential (primary) hypertension: Secondary | ICD-10-CM | POA: Insufficient documentation

## 2023-05-29 LAB — RESP PANEL BY RT-PCR (RSV, FLU A&B, COVID)  RVPGX2
Influenza A by PCR: NEGATIVE
Influenza B by PCR: NEGATIVE
Resp Syncytial Virus by PCR: NEGATIVE
SARS Coronavirus 2 by RT PCR: NEGATIVE

## 2023-05-29 LAB — CBC
HCT: 37 % (ref 36.0–46.0)
Hemoglobin: 12 g/dL (ref 12.0–15.0)
MCH: 29.5 pg (ref 26.0–34.0)
MCHC: 32.4 g/dL (ref 30.0–36.0)
MCV: 90.9 fL (ref 80.0–100.0)
Platelets: 209 10*3/uL (ref 150–400)
RBC: 4.07 MIL/uL (ref 3.87–5.11)
RDW: 14.5 % (ref 11.5–15.5)
WBC: 5.5 10*3/uL (ref 4.0–10.5)
nRBC: 0 % (ref 0.0–0.2)

## 2023-05-29 LAB — URINALYSIS, ROUTINE W REFLEX MICROSCOPIC
Bilirubin Urine: NEGATIVE
Glucose, UA: NEGATIVE mg/dL
Hgb urine dipstick: NEGATIVE
Ketones, ur: NEGATIVE mg/dL
Leukocytes,Ua: NEGATIVE
Nitrite: NEGATIVE
Protein, ur: NEGATIVE mg/dL
Specific Gravity, Urine: 1.005 (ref 1.005–1.030)
pH: 7 (ref 5.0–8.0)

## 2023-05-29 LAB — BASIC METABOLIC PANEL
Anion gap: 11 (ref 5–15)
BUN: 26 mg/dL — ABNORMAL HIGH (ref 8–23)
CO2: 29 mmol/L (ref 22–32)
Calcium: 9 mg/dL (ref 8.9–10.3)
Chloride: 97 mmol/L — ABNORMAL LOW (ref 98–111)
Creatinine, Ser: 0.92 mg/dL (ref 0.44–1.00)
GFR, Estimated: >60 mL/min (ref >60)
Glucose, Bld: 95 mg/dL (ref 70–99)
Potassium: 4.4 mmol/L (ref 3.5–5.1)
Sodium: 137 mmol/L (ref 135–145)

## 2023-05-29 LAB — TSH: TSH: 1.989 u[IU]/mL (ref 0.350–4.500)

## 2023-05-29 LAB — TROPONIN I (HIGH SENSITIVITY): Troponin I (High Sensitivity): 5 ng/L (ref <18)

## 2023-05-29 MED ORDER — SODIUM CHLORIDE 0.9 % IV BOLUS
1000.0000 mL | Freq: Once | INTRAVENOUS | Status: AC
Start: 2023-05-29 — End: 2023-05-29
  Administered 2023-05-29: 1000 mL via INTRAVENOUS

## 2023-05-29 NOTE — ED Provider Triage Note (Signed)
Emergency Medicine Provider Triage Evaluation Note  Carly Collins , a 68 y.o. female  was evaluated in triage.  Pt complains of weakness and fatigue per caregiver.  Review of Systems  Positive:  Negative:   Physical Exam  BP 106/67 (BP Location: Left Arm)   Pulse 68   Temp 97.6 F (36.4 C) (Oral)   Resp 16   Ht 5\' 6"  (1.676 m)   Wt 72.5 kg   SpO2 100%   BMI 25.80 kg/m  Gen:   Awake, no distress   Resp:  Normal effort  MSK:   Moves extremities without difficulty  Other:    Medical Decision Making  Medically screening exam initiated at 10:24 AM.  Appropriate orders placed.  RAIGAN BOGA was informed that the remainder of the evaluation will be completed by another provider, this initial triage assessment does not replace that evaluation, and the importance of remaining in the ED until their evaluation is complete.     Faythe Ghee, PA-C 05/29/23 1025

## 2023-05-29 NOTE — ED Provider Notes (Signed)
[facility] Provider Note    Event Date/Time   First MD Initiated Contact with Patient 05/29/23 1320     (approximate)   History   Fatigue   HPI  Carly Collins is a 68 y.o. female with a history of developmental delay, diabetes, hypertension, hyperlipidemia, major depression with psychotic features, GERD, and obesity who presents with apparent confusion over the last few days.  Per the caregiver from the patient's group home, the patient has been "talking out of her head" for the last couple of days and has been somewhat lethargic.  She has also been complaining of abdominal pain.  The patient currently does say that her belly hurts.  The caregiver mentions that the patient fell and hit her head a few days ago and was checked out for that.  She also states that the patient hardly drinks and they are worried she is dehydrated.  I reviewed the past medical records.  The patient was seen in the ED on 1/13 after a fall and head injury.  CT head was negative at that time.  Previously her most recent documented outpatient encounter was on 1014 of last year with [facility].  She was last hospitalized in October 2023 with a UTI.   Physical Exam   Triage Vital Signs: ED Triage Vitals  Encounter Vitals Group     BP 05/29/23 1014 106/67     Systolic BP Percentile --      Diastolic BP Percentile --      Pulse Rate 05/29/23 1014 68     Resp 05/29/23 1014 16     Temp 05/29/23 1014 97.6 F (36.4 C)     Temp Source 05/29/23 1014 Oral     SpO2 05/29/23 1014 100 %     Weight 05/29/23 1015 159 lb 13.3 oz (72.5 kg)     Height 05/29/23 1015 5\' 6"  (1.676 m)     Head Circumference --      Peak Flow --      Pain Score 05/29/23 1015 0     Pain Loc --      Pain Education --      Exclude from Growth Chart --     Most recent vital signs: Vitals:   05/29/23 1014 05/29/23 1506  BP: 106/67 (!) 129/108  Pulse: 68 96  Resp: 16 16  Temp: 97.6 F (36.4 C) 97.6 F  (36.4 C)  SpO2: 100% 93%     General: Alert, oriented x 2, no distress.  CV:  Good peripheral perfusion.  Resp:  Normal effort.  Lungs CTAB. Abd:  Soft with mild bilateral lower quadrant tenderness.  No peritoneal signs.  No distention.  Other:  Dry mucous membranes.  EOMI.  PERRLA.  No photophobia.  No facial droop.  Motor intact in all extremities.   ED Results / Procedures / Treatments   Labs (all labs ordered are listed, but only abnormal results are displayed) Labs Reviewed  BASIC METABOLIC PANEL - Abnormal; Notable for the following components:      Result Value   Chloride 97 (*)    BUN 26 (*)    All other components within normal limits  RESP PANEL BY RT-PCR (RSV, FLU A&B, COVID)  RVPGX2  CBC  TSH  URINALYSIS, ROUTINE W REFLEX MICROSCOPIC  CBG MONITORING, ED  TROPONIN I (HIGH SENSITIVITY)     EKG  ED ECG REPORT I, Dionne Bucy, the attending physician, personally viewed and interpreted this ECG.  Date: 05/29/2023 EKG Time: 1032 Rate: 67 Rhythm: normal sinus rhythm QRS Axis: normal Intervals: normal ST/T Wave abnormalities: normal Narrative Interpretation: no evidence of acute ischemia   RADIOLOGY  CT head: I independently viewed and interpreted the images; there is no ICH.  Radiology report indicates no acute abnormality.  CT abdomen/pelvis: Pending   PROCEDURES:  Critical Care performed: No  Procedures   MEDICATIONS ORDERED IN ED: Medications  sodium chloride 0.9 % bolus 1,000 mL (1,000 mLs Intravenous New Bag/Given 05/29/23 1419)     IMPRESSION / MDM / ASSESSMENT AND PLAN / ED COURSE  I reviewed the triage vital signs and the nursing notes.  68 year old female with PMH as noted above presents with increased confusion over the last few days along with concern for possible dehydration.  She had a recent head injury several days ago but had negative imaging after this.  On exam the patient is overall relatively well-appearing.  She is  alert and oriented x 2.  She has borderline elevated blood pressure with otherwise normal vital signs.  Neurologic exam is nonfocal.  Differential diagnosis includes, but is not limited to, UTI, COVID, other infection, gastroenteritis or other acute intra-abdominal process, dehydration, electrolyte abnormality, AKI, hyperglycemia, other metabolic disturbance, hypothyroidism, less likely primary cardiac or CNS cause.  Given the recent head injury we will obtain a repeat CT to rule out delayed bleed or other acute finding as well as a CT of the abdomen/pelvis given the patient's reported pain there and the difficulty obtaining a reliable history in this patient with an intellectual disability.  We will also obtain lab workup, urinalysis, give a fluid bolus, and reassess.  Patient's presentation is most consistent with acute presentation with potential threat to life or bodily function.  ----------------------------------------- 3:32 PM on 05/29/2023 -----------------------------------------  CT head is negative.  Initial lab workup so far is reassuring.  Urinalysis and CT abdomen/pelvis are still pending.  I have signed the patient out to the oncoming ED physician Dr. Arnoldo Morale.   FINAL CLINICAL IMPRESSION(S) / ED DIAGNOSES   Final diagnoses:  Altered mental status, unspecified altered mental status type     Rx / DC Orders   ED Discharge Orders     None        Note:  This document was prepared using Dragon voice recognition software and may include unintentional dictation errors.    Dionne Bucy, MD 05/29/23 [phone]

## 2023-05-29 NOTE — ED Provider Notes (Signed)
Care assumed of patient from outgoing provider.  See their note for initial history, exam and plan.  Clinical Course as of 05/29/23 1628  Thu May 29, 2023  1509 Presents from a group home with generalized weakness and slight worsening AMS.  AAOx2. Concern for dehydration.  Given IVF and Ct head and CT A/P ordered. UA pending. If w/u negative likely will be able to dc home  [SM]    Clinical Course User Index [SM] Corena Herter, MD   CT scan of the head with no acute findings.  UA with no signs of urinary tract infection.  CT scan of the abdomen and pelvis with findings of constipation.  Rectal exam with no signs of impaction.  Discussed MiraLAX cleanout.  Discussed close follow-up with primary care physician and return to the emergency department for any ongoing or worsening symptoms.   Corena Herter, MD 05/29/23 [phone]

## 2023-05-29 NOTE — ED Triage Notes (Signed)
First Nurse Note;  Pt via [facility] from [facility]. Staff states that pt has been lethargic. Pt c/o being hungry. Pt is alert. 52 HR  93% on RA  127/63 BP  151 CBG  98.0 oral

## 2023-05-29 NOTE — ED Triage Notes (Signed)
Day program attendant states that she seemed very lethargic this am, states that she normally is ready to get up and go. Pt is awake and answering questions and states that she is hungry

## 2023-05-29 NOTE — Discharge Instructions (Signed)
You were seen in the emergency department for abdominal pain and altered mental status.  The CT scan of your head was normal.  You did not have any findings of urinary tract infection.  Your lab work was overall normal.  Your CT scan showed findings of constipation.  Do MiraLAX cleanout.  If unsuccessful you can repeat the following day.  If continues to have problems with constipation follow-up closely with your primary care physician or return to the emergency department for reevaluation.  Constipation management - take MiraLAX (4 capfulls) mixed with 32-64 ounces of fluid.  Drink this entire drink.  If you do not have a bowel movement that day you can repeat the next day and add 2 capfuls for a total of 6 capfuls of MiraLAX.  MiraLAX does not work if you do not drink plenty of fluids with it. Once you have a good bowel movement, take 1 capfull of Miralax daily until having regular bowel movements.  Thank you for choosing [place] for your health care, it was my pleasure to care for you today!  Corena Herter, MD

## 2023-08-01 ENCOUNTER — Encounter: Payer: Self-pay | Admitting: Podiatry

## 2023-08-01 ENCOUNTER — Ambulatory Visit: Payer: Medicare Other | Admitting: Podiatry

## 2023-08-01 VITALS — Ht 66.0 in | Wt 159.8 lb

## 2023-08-01 DIAGNOSIS — B351 Tinea unguium: Secondary | ICD-10-CM

## 2023-08-01 DIAGNOSIS — M79674 Pain in right toe(s): Secondary | ICD-10-CM | POA: Diagnosis not present

## 2023-08-01 DIAGNOSIS — M79675 Pain in left toe(s): Secondary | ICD-10-CM

## 2023-08-08 ENCOUNTER — Encounter: Payer: Self-pay | Admitting: Podiatry

## 2023-08-08 NOTE — Progress Notes (Signed)
  Subjective:  Patient ID: Carly Collins, female    DOB: 11/21/1955,  MRN: [id number]  68 y.o. female presents painful, elongated thickened toenails x 10 which are symptomatic when wearing enclosed shoe gear. This interferes with his/her daily activities. Chief Complaint  Patient presents with   Nail Problem    Pt is here for [facility] unsure of last A1C PCP is Dr Blondell Reveal and LOV was in December.   New problem(s): None   PCP is Conan Bowens., MD.  Allergies  Allergen Reactions   Pioglitazone Other (See Comments)    unknown  Unknown to patient    Review of Systems: Negative except as noted in the HPI.   Objective:  Carly Collins is a pleasant 68 y.o. female obese in NAD. AAO x 3.  Vascular Examination: Vascular status intact b/l with palpable pedal pulses. CFT immediate b/l. Pedal hair present. No edema. No pain with calf compression b/l. Skin temperature gradient WNL b/l. No varicosities noted. No cyanosis or clubbing noted.  Neurological Examination: Sensation grossly intact b/l with 10 gram monofilament. Vibratory sensation intact b/l.  Dermatological Examination: Pedal skin is warm and supple b/l LE. No open wounds b/l LE. No interdigital macerations noted b/l LE. Toenails 1-5 bilaterally elongated, discolored, dystrophic, thickened, and crumbly with subungual debris and tenderness to dorsal palpation. No corns, calluses nor porokeratotic lesions noted.  Musculoskeletal Examination: Muscle strength 5/5 to b/l LE.  No pain, crepitus noted b/l. No gross pedal deformities. Patient ambulates independently without assistive aids.   Radiographs: None  Last A1c:       No data to display           Assessment:   1. Pain due to onychomycosis of toenails of both feet    Plan:  -Caregiver/provider present with patient on today's visit. -Consent given for treatment as described below: -Examined patient. -Continue foot and shoe inspections daily. Monitor blood glucose per  PCP/Endocrinologist's recommendations. -Mycotic toenails 1-5 bilaterally were debrided in length and girth with sterile nail nippers and dremel without incident. -Patient/POA to call should there be question/concern in the interim.  Return in about 3 months (around 11/01/2023).  Freddie Breech, DPM      [place] LOCATION: [address] [place] [address]                   Office [phone]   [facility] LOCATION: [address] [place], [place] [address] Office [phone]

## 2023-09-29 ENCOUNTER — Other Ambulatory Visit: Payer: Self-pay

## 2023-09-29 ENCOUNTER — Emergency Department
Admission: EM | Admit: 2023-09-29 | Discharge: 2023-09-30 | Disposition: A | Discharge status: Home / Self Care | Attending: Emergency Medicine | Admitting: Emergency Medicine

## 2023-09-29 DIAGNOSIS — R4781 Slurred speech: Secondary | ICD-10-CM | POA: Insufficient documentation

## 2023-09-29 DIAGNOSIS — I10 Essential (primary) hypertension: Secondary | ICD-10-CM | POA: Diagnosis not present

## 2023-09-29 DIAGNOSIS — R4182 Altered mental status, unspecified: Secondary | ICD-10-CM | POA: Insufficient documentation

## 2023-09-29 DIAGNOSIS — E119 Type 2 diabetes mellitus without complications: Secondary | ICD-10-CM | POA: Diagnosis not present

## 2023-09-29 DIAGNOSIS — I959 Hypotension, unspecified: Secondary | ICD-10-CM | POA: Insufficient documentation

## 2023-09-29 DIAGNOSIS — T43591A Poisoning by other antipsychotics and neuroleptics, accidental (unintentional), initial encounter: Secondary | ICD-10-CM | POA: Diagnosis not present

## 2023-09-29 DIAGNOSIS — T428X1A Poisoning by antiparkinsonism drugs and other central muscle-tone depressants, accidental (unintentional), initial encounter: Secondary | ICD-10-CM | POA: Insufficient documentation

## 2023-09-29 DIAGNOSIS — T50901A Poisoning by unspecified drugs, medicaments and biological substances, accidental (unintentional), initial encounter: Secondary | ICD-10-CM

## 2023-09-29 DIAGNOSIS — R4589 Other symptoms and signs involving emotional state: Secondary | ICD-10-CM | POA: Diagnosis not present

## 2023-09-29 MED ORDER — SODIUM CHLORIDE 0.9 % IV BOLUS
1000.0000 mL | Freq: Once | INTRAVENOUS | Status: AC
  Administered 2023-09-30: 1000 mL via INTRAVENOUS

## 2023-09-29 NOTE — ED Notes (Addendum)
 Poison control contacted by this RN at 2106  6hr observation period until returns to baseline per poison control.

## 2023-09-29 NOTE — ED Triage Notes (Signed)
 Pt presents via POV from a group home [facility] [address].  c/o pt taking another individuals medication tonight.   Pt presents extremely drowsy and drooling.   Group home representative with patient on arrival.

## 2023-09-29 NOTE — ED Triage Notes (Signed)
 Group home reports pt took the following medications:  Medications were administered to her by accident by the group home instead of the intended individual.   Omeprazole 20mg  Baclofen  20mg  Dicyclomine 10mg  Carbamazepine XR Mirtazapine  15mg  Olanzapine 10mg 

## 2023-09-29 NOTE — ED Provider Notes (Signed)
 [facility] Provider Note   Event Date/Time   First MD Initiated Contact with Patient 09/29/23 2130     (approximate) History  Drug Overdose  HPI Carly Collins is a 68 y.o. female with a past medical history of type 2 diabetes, electro disability, hyperlipidemia, and hypertension who presents from long-term care facility with 2 caregivers stating that patient was accidentally given another residents medication including significant medications of 20 mg of baclofen and 10 mg of Zyprexa.  Patient arrives drowsy but awoken to loud voice and has no complaints. ROS: Patient currently denies any vision changes, tinnitus, difficulty speaking, facial droop, sore throat, chest pain, shortness of breath, abdominal pain, nausea/vomiting/diarrhea, dysuria, or weakness/numbness/paresthesias in any extremity   Physical Exam  Triage Vital Signs: ED Triage Vitals  Encounter Vitals Group     BP 09/29/23 2110 120/67     Systolic BP Percentile --      Diastolic BP Percentile --      Pulse Rate 09/29/23 2110 (!) 58     Resp 09/29/23 2110 10     Temp 09/29/23 2112 98.1 F (36.7 C)     Temp Source 09/29/23 2112 Oral     SpO2 09/29/23 2110 96 %     Weight --      Height --      Head Circumference --      Peak Flow --      Pain Score 09/29/23 2110 Asleep     Pain Loc --      Pain Education --      Exclude from Growth Chart --    Most recent vital signs: Vitals:   09/29/23 2145 09/29/23 2200  BP: (!) 105/58   Pulse: 61 (!) 56  Resp: 13 13  Temp:    SpO2: 97% 98%   General: Awake, productive CV:  Good peripheral perfusion.  Resp:  Normal effort.  Abd:  No distention.  Other:  Elderly overweight Caucasian female resting comfortably in no acute distress ED Results / Procedures / Treatments  Labs (all labs ordered are listed, but only abnormal results are displayed) Labs Reviewed - No data to display PROCEDURES: Critical Care performed: No Procedures MEDICATIONS  ORDERED IN ED: Medications  sodium chloride  0.9 % bolus 1,000 mL (has no administration in time range)   IMPRESSION / MDM / ASSESSMENT AND PLAN / ED COURSE  I reviewed the triage vital signs and the nursing notes.                             The patient is on the cardiac monitor to evaluate for evidence of arrhythmia and/or significant heart rate changes. Patient's presentation is most consistent with acute presentation with potential threat to life or bodily function. Patient presents after an unintentional overdose of known medications including baclofen and Zyprexa + Altered mental status + Slurred, sluggish behavior. + Hypotension  Presentation most consistent with opioid ingestion. ED Interventions: 1 L NS Given History, Exam and Response to Interventions I have a low suspicion for Posterior Circulation Stroke, EtOH Intoxication, Intracranial bleed, Sepsis, Hypothyroidism, Environmental Hypothermia. According to poison control, patient only needs 6 hours of observation without any need for EKG or laboratory evaluation Disposition: Care of this patient will be signed out to the oncoming physician at the end of my shift.  All pertinent patient information conveyed and all questions answered.  All further care and disposition decisions will be  made by the oncoming physician.   FINAL CLINICAL IMPRESSION(S) / ED DIAGNOSES   Final diagnoses:  Accidental drug ingestion, initial encounter   Rx / DC Orders   ED Discharge Orders     None      Note:  This document was prepared using Dragon voice recognition software and may include unintentional dictation errors.   Dylin Breeden K, MD 09/29/23 2325

## 2023-09-30 DIAGNOSIS — T428X1A Poisoning by antiparkinsonism drugs and other central muscle-tone depressants, accidental (unintentional), initial encounter: Secondary | ICD-10-CM | POA: Diagnosis not present

## 2023-09-30 NOTE — ED Provider Notes (Signed)
 Patient was signed out to me the plan to observe until 3 AM given accidental medication administration.  She has been stable sleeping comfortably with normal hemodynamics.  Plan be for discharge.   Buell Carmin, MD 09/30/23 [phone]

## 2023-09-30 NOTE — ED Notes (Addendum)
 Cleansed of urinary incontinence. Pt too drowsy to stand - cannot arouse long enough to get pt to change positions.  Plan of care discussed with Dr.Wong and pt will remain in ED to sleep until more alert. Group home staff member remains at bedside and states group home [facility] transport will be available again around 8-9am.

## 2023-09-30 NOTE — ED Notes (Signed)
 Pt awake. Given ice water. Group home rep remains at bedside - feeding pt applesauce.

## 2023-11-03 ENCOUNTER — Ambulatory Visit (INDEPENDENT_AMBULATORY_CARE_PROVIDER_SITE_OTHER): Admitting: Podiatry

## 2023-11-03 DIAGNOSIS — Z91198 Patient's noncompliance with other medical treatment and regimen for other reason: Secondary | ICD-10-CM

## 2023-11-03 NOTE — Progress Notes (Signed)
 1. Failure to attend appointment with reason given    Patient called to reschedule appointment.

## 2023-11-10 ENCOUNTER — Encounter: Payer: Self-pay | Admitting: Podiatry

## 2023-11-10 ENCOUNTER — Ambulatory Visit (INDEPENDENT_AMBULATORY_CARE_PROVIDER_SITE_OTHER): Admitting: Podiatry

## 2023-11-10 DIAGNOSIS — M79674 Pain in right toe(s): Secondary | ICD-10-CM

## 2023-11-10 DIAGNOSIS — M79675 Pain in left toe(s): Secondary | ICD-10-CM | POA: Diagnosis not present

## 2023-11-10 DIAGNOSIS — B351 Tinea unguium: Secondary | ICD-10-CM

## 2023-11-10 NOTE — Progress Notes (Signed)
  Subjective:  Patient ID: Carly Collins, female    DOB: 02-05-1956,  MRN: [id number]  68 y.o. female presents to clinic with  painful thick toenails that are difficult to trim. Pain interferes with ambulation. Aggravating factors include wearing enclosed shoe gear. Pain is relieved with periodic professional debridement. She is accompanied by her caregiver on today's visit. Chief Complaint  Patient presents with   RFC    RM1 Rfc diabetic/ Alc 7/ Dr. Lenon last visit April 2025   New problem(s): None   PCP is Woodrow Kasandra SAUNDERS., MD. Carly Collins 10/14/2023.  Allergies  Allergen Reactions   Pioglitazone Other (See Comments)    unknown  Unknown to patient    Review of Systems: Negative except as noted in the HPI.   Objective:  Carly Collins is a pleasant 68 y.o. female WD, WN in NAD. AAO x 3.  Vascular Examination: Vascular status intact b/l with palpable pedal pulses. CFT immediate b/l. No edema. No pain with calf compression b/l. Skin temperature gradient WNL b/l.   Neurological Examination: Sensation grossly intact b/l with 10 gram monofilament. Vibratory sensation intact b/l.   Dermatological Examination: Pedal skin with normal turgor, texture and tone b/l. Toenails 1-5 b/l thick, discolored, elongated with subungual debris and pain on dorsal palpation. No hyperkeratotic lesions noted b/l.   Musculoskeletal Examination: Muscle strength 5/5 to b/l LE. No pain, crepitus or joint limitation noted with ROM bilateral LE. No gross bony deformities bilaterally. Utilizes wheelchair for mobility assistance.  Radiographs: None  Last A1c:       No data to display           Assessment:   1. Pain due to onychomycosis of toenails of both feet    Plan:  -Caregiver/provider present with patient on today's visit. -Patient to continue soft, supportive shoe gear daily. -Toenails 1-5 b/l were debrided in length and girth with sterile nail nippers and dremel without iatrogenic  bleeding.  -Patient/POA to call should there be question/concern in the interim.  Return in about 3 months (around 02/10/2024).  Delon LITTIE Merlin, DPM      [place] LOCATION: [address] [place] [address]                   Office [phone]   [facility] LOCATION: [address] [place], [place] [address] Office [phone]

## 2023-12-18 ENCOUNTER — Ambulatory Visit
Admission: EM | Admit: 2023-12-18 | Discharge: 2023-12-18 | Disposition: A | Discharge status: Home / Self Care | Attending: Physician Assistant | Admitting: Physician Assistant

## 2023-12-18 DIAGNOSIS — N3001 Acute cystitis with hematuria: Secondary | ICD-10-CM | POA: Insufficient documentation

## 2023-12-18 DIAGNOSIS — R300 Dysuria: Secondary | ICD-10-CM | POA: Diagnosis present

## 2023-12-18 LAB — URINALYSIS, W/ REFLEX TO CULTURE (INFECTION SUSPECTED)
Bilirubin Urine: NEGATIVE
Glucose, UA: NEGATIVE mg/dL
Ketones, ur: NEGATIVE mg/dL
Nitrite: NEGATIVE
Protein, ur: NEGATIVE mg/dL
Specific Gravity, Urine: 1.02 (ref 1.005–1.030)
pH: 5.5 (ref 5.0–8.0)

## 2023-12-18 MED ORDER — CEPHALEXIN 500 MG PO CAPS
500.0000 mg | ORAL_CAPSULE | Freq: Three times a day (TID) | ORAL | 0 refills | Status: AC
Start: 2023-12-18 — End: 2023-12-25

## 2023-12-18 NOTE — Discharge Instructions (Signed)

## 2023-12-18 NOTE — ED Provider Notes (Signed)
 [facility]    CSN: [id number] Arrival date & time: 12/18/23  1102      History   Chief Complaint Chief Complaint  Patient presents with   Urinary Frequency   Dysuria    HPI Carly Collins is a 68 y.o. female presenting for dysuria, urinary frequency and increased agitation for the past few days. Patient presents from a group home with 2 caregivers. No fever, fatigue, vomiting, diarrhea, or weakness.  HPI  Past Medical History:  Diagnosis Date   Anxiety    C. difficile enteritis    Cancer (HCC)    Depression    Diabetes mellitus without complication (HCC)    GERD (gastroesophageal reflux disease)    Headache    Hyperlipidemia    Hypertension    Mental retardation, mild (I.Q. 50-70)    Moderate intellectual disabilities    Obesity    Recurrent UTI    Tibial fracture    Vitamin D  deficiency     Patient Active Problem List   Diagnosis Date Noted   BV (bacterial vaginosis) 03/27/2023   Femoral distal fracture (HCC) 02/14/2022   Acute UTI 02/13/2022   Acute metabolic encephalopathy 02/13/2022   Intertriginous dermatitis associated with moisture 02/13/2022   COVID-19 virus infection 02/13/2022   Hypertension    Pain due to onychomycosis of toenails of both feet 12/14/2020   History of abnormal mammogram 01/28/2019   Recurrent UTI 01/28/2019   Chronic kidney disease (CKD), stage III (moderate) (HCC) 11/11/2018   Long-term use of high-risk medication 10/13/2018   Anxiety 07/31/2017   Anxiety and depression 10/16/2016   GERD (gastroesophageal reflux disease) 10/16/2016   Hyperlipidemia associated with type 2 diabetes mellitus (HCC) 08/29/2016   Moderate intellectual disabilities 11/15/2015   IDA (iron  deficiency anemia) 09/22/2014   Closed fracture of distal end of left humerus 06/08/2014   Headache 11/30/2013   Breast cancer (HCC) 04/18/2013   History of Clostridium difficile 10/30/2012   Symptomatic cholelithiasis 10/26/2012   Closed fracture of  condyle of femur (HCC) 10/05/2012   Closed fracture of proximal tibia 10/05/2012   Cognitive deficits 10/05/2012   Diabetes mellitus (HCC) 10/05/2012   Hypertension associated with diabetes (HCC) 10/05/2012   Hyperlipidemia, unspecified 10/05/2012   Obesity 10/05/2012   Vitamin D  deficiency, unspecified 10/05/2012   Urinary tract infection 10/05/2012   Left tibial fracture 09/10/2012   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   Vitamin B12 deficiency 09/02/2000    Past Surgical History:  Procedure Laterality Date   ABDOMINAL SURGERY     APPENDECTOMY     BREAST SURGERY     COLON SURGERY     COLONOSCOPY N/A 10/26/2014   Procedure: COLONOSCOPY;  Surgeon: Gladis RAYMOND Mariner, MD;  Location: [facility] ENDOSCOPY;  Service: Endoscopy;  Laterality: N/A;   COLONOSCOPY N/A 01/21/2020   Procedure: COLONOSCOPY;  Surgeon: Dessa Reyes ORN, MD;  Location: [facility] ENDOSCOPY;  Service: Endoscopy;  Laterality: N/A;   DILATION AND CURETTAGE OF UTERUS     ESOPHAGOGASTRODUODENOSCOPY N/A 01/21/2020   Procedure: ESOPHAGOGASTRODUODENOSCOPY (EGD);  Surgeon: Dessa Reyes ORN, MD;  Location: [facility] ENDOSCOPY;  Service: Endoscopy;  Laterality: N/A;   ESOPHAGOGASTRODUODENOSCOPY (EGD) WITH PROPOFOL  N/A 10/26/2014   Procedure: ESOPHAGOGASTRODUODENOSCOPY (EGD) WITH PROPOFOL ;  Surgeon: Gladis RAYMOND Mariner, MD;  Location: [facility] ENDOSCOPY;  Service: Endoscopy;  Laterality: N/A;   FRACTURE SURGERY     ORIF FEMUR FRACTURE Left 02/15/2022   Procedure: OPEN REDUCTION INTERNAL FIXATION (ORIF) DISTAL FEMUR FRACTURE;  Surgeon: Edie Norleen PARAS, MD;  Location: [facility] ORS;  Service: Orthopedics;  Laterality: Left;    OB History     Gravida  0   Para  0   Term  0   Preterm  0   AB  0   Living  0      SAB  0   IAB  0   Ectopic  0   Multiple  0   Live Births  0            Home Medications    Prior to Admission medications   Medication Sig Start Date End Date Taking? Authorizing Provider   alendronate (FOSAMAX) 70 MG tablet Take 70 mg by mouth every Saturday.   Yes [provider]  brexpiprazole  (REXULTI ) 1 MG TABS tablet Take 1 mg by mouth in the morning.   Yes [provider]  cephALEXin  (KEFLEX ) 500 MG capsule Take 1 capsule (500 mg total) by mouth 3 (three) times daily for 7 days. 12/18/23 12/25/23 Yes Arvis Jolan NOVAK, PA-C  Cholecalciferol  (VITAMIN D -1000 MAX ST) 25 MCG (1000 UT) tablet Take 1,000 Units by mouth.   Yes [provider]  clonazePAM  (KLONOPIN ) 1 MG tablet Take 0.5-1 mg by mouth See admin instructions. Take 1 tablet (1mg ) by mouth every morning and take  tablet (0.5mg ) by mouth every night at bedtime   Yes [provider]  Dentifrices (BIOTENE DRY MOUTH) PSTE Place 1 Application onto teeth 3 (three) times daily.   Yes [provider]  divalproex  (DEPAKOTE  SPRINKLE) 125 MG capsule Take 250-375 mg by mouth See admin instructions. Take 2 capsules (250mg ) by mouth every morning, 2 capsules (250mg ) every day at noon then take 3 capsules (375mg ) by mouth every night at bedtime   Yes [provider]  docusate sodium  (COLACE) 100 MG capsule Take 100 mg by mouth 2 (two) times daily as needed for mild constipation.   Yes [provider]  donepezil  (ARICEPT ) 10 MG tablet Take 5 mg by mouth 2 (two) times daily.   Yes [provider]  DULoxetine  (CYMBALTA ) 30 MG capsule Take 30 mg by mouth daily. (Take with 60mg  capsule to equal 90mg  total)   Yes [provider]  DULoxetine  (CYMBALTA ) 60 MG capsule Take 60 mg by mouth daily. (Take with 30mg  capsule to equal 90mg  total)   Yes [provider]  ferrous gluconate  (FERGON) 324 MG tablet Take 1 tablet (324 mg total) by mouth 2 (two) times daily with a meal. 02/22/22  Yes Caleen Qualia, MD  folic acid  (FOLVITE ) 400 MCG tablet Take 800 mcg by mouth daily.   Yes [provider]  hydrOXYzine (ATARAX) 25 MG tablet Take 25 mg by mouth 2 (two) times  daily.   Yes [provider]  letrozole  (FEMARA ) 2.5 MG tablet Take 2.5 mg by mouth daily.   Yes [provider]  loperamide (IMODIUM) 2 MG capsule Take 2 mg by mouth as needed for diarrhea or loose stools.   Yes [provider]  magnesium  hydroxide (MILK OF MAGNESIA) 400 MG/5ML suspension Take 30 mLs by mouth daily as needed for mild constipation or moderate constipation.   Yes [provider]  memantine  (NAMENDA ) 10 MG tablet Take 10 mg by mouth 2 (two) times daily.   Yes [provider]  mirtazapine  (REMERON ) 7.5 MG tablet Take 7.5 mg by mouth at bedtime.   Yes [provider]  Mouthwashes (BIOTENE/CALCIUM PBF) LIQD Use as directed 15 mLs in the mouth or throat 2 (  two) times daily.   Yes [provider]  nystatin  (MYCOSTATIN /NYSTOP ) powder Apply 1 Application topically every 12 (twelve) hours as needed (fungal symptoms in skin folds).   Yes [provider]  nystatin -triamcinolone  (MYCOLOG II) cream Apply 1 Application topically 2 (two) times daily as needed (skin eruptions).   Yes [provider]  phenylephrine -shark liver oil-mineral oil-petrolatum (PREPARATION H) 0.25-14-74.9 % rectal ointment Place 1 Application rectally daily as needed for hemorrhoids.   Yes [provider]  polyethylene glycol (MIRALAX  / GLYCOLAX ) packet Take 17 g by mouth at bedtime.   Yes [provider]  QUEtiapine  (SEROQUEL ) 300 MG tablet Take 300 mg by mouth at bedtime.   Yes [provider]  QUEtiapine  (SEROQUEL ) 50 MG tablet Take 50 mg by mouth 2 (two) times daily.   Yes [provider]  acetaminophen  (MAPAP) 500 MG tablet Take 1,000 mg by mouth 3 (three) times daily.    [provider]  acetaminophen  (TYLENOL ) 325 MG tablet Take 325-650 mg by mouth every 6 (six) hours as needed for mild pain or moderate pain.    [provider]  enoxaparin  (LOVENOX ) 40 MG/0.4ML injection Inject 0.4 mLs  (40 mg total) into the skin daily. 02/20/22   Kip Lynwood Double, PA-C  guaiFENesin  (DIABETIC TUSSIN EX) 100 MG/5ML liquid Take 10 mLs by mouth every 6 (six) hours as needed for cough or to loosen phlegm.    [provider]  HYDROcodone -acetaminophen  (NORCO) 7.5-325 MG tablet Take 1 tablet by mouth every 6 (six) hours as needed for moderate pain or severe pain. 02/17/22   Charlene Debby BROCKS, PA-C  metroNIDAZOLE  (FLAGYL ) 500 MG tablet Take 1 tablet (500 mg total) by mouth 2 (two) times daily. 03/27/23   Defelice, Jeanette, NP  pantoprazole  (PROTONIX ) 40 MG tablet Take 1 tablet (40 mg total) by mouth in the morning. 03/08/22 11/10/23  Goodman, Graydon, MD  pseudoephedrine (SUDAFED) 30 MG tablet Take 30 mg by mouth every 6 (six) hours as needed for congestion.    [provider]  sitaGLIPtin (JANUVIA) 100 MG tablet Take 100 mg by mouth daily.  01/17/19  [provider]    Family History Family History  Problem Relation Age of Onset   Other Mother    Other Father     Social History Social History   Tobacco Use   Smoking status: Never   Smokeless tobacco: Never  Vaping Use   Vaping status: Never Used  Substance Use Topics   Alcohol use: No   Drug use: No     Allergies   Pioglitazone   Review of Systems Review of Systems  Constitutional:  Negative for fatigue and fever.  Gastrointestinal:  Positive for abdominal pain. Negative for diarrhea and vomiting.  Genitourinary:  Positive for dysuria and frequency. Negative for decreased urine volume, hematuria and vaginal discharge.  Skin:  Negative for rash.     Physical Exam Triage Vital Signs ED Triage Vitals  Encounter Vitals Group     BP      Girls Systolic BP Percentile      Girls Diastolic BP Percentile      Boys Systolic BP Percentile      Boys Diastolic BP Percentile      Pulse      Resp      Temp      Temp src      SpO2      Weight      Height      Head  Circumference      Peak Flow       Pain Score      Pain Loc      Pain Education      Exclude from Growth Chart    No data found.  Updated Vital Signs BP 116/75 (BP Location: Left Arm)   Pulse 60   Temp 98.2 F (36.8 C)   Resp 18   SpO2 98%      Physical Exam Vitals and nursing note reviewed.  Constitutional:      General: She is not in acute distress.    Appearance: Normal appearance. She is not ill-appearing or toxic-appearing.  HENT:     Head: Normocephalic and atraumatic.  Eyes:     General: No scleral icterus.       Right eye: No discharge.        Left eye: No discharge.     Conjunctiva/sclera: Conjunctivae normal.  Cardiovascular:     Rate and Rhythm: Normal rate and regular rhythm.     Heart sounds: Normal heart sounds.  Pulmonary:     Effort: Pulmonary effort is normal. No respiratory distress.     Breath sounds: Normal breath sounds.  Abdominal:     Palpations: Abdomen is soft.     Tenderness: There is no abdominal tenderness.  Musculoskeletal:     Cervical back: Neck supple.  Skin:    General: Skin is dry.  Neurological:     General: No focal deficit present.     Mental Status: She is alert. Mental status is at baseline.     Motor: No weakness.  Psychiatric:        Mood and Affect: Mood normal.      UC Treatments / Results  Labs (all labs ordered are listed, but only abnormal results are displayed) Labs Reviewed  URINALYSIS, W/ REFLEX TO CULTURE (INFECTION SUSPECTED) - Abnormal; Notable for the following components:      Result Value   APPearance CLOUDY (*)    Hgb urine dipstick TRACE (*)    Leukocytes,Ua MODERATE (*)    Bacteria, UA FEW (*)    All other components within normal limits  URINE CULTURE   Comprehensive Metabolic Panel Order: [id number] Component Ref Range & Units 6 mo ago  Sodium 135 - 145 mmol/L 141  Potassium 3.4 - 4.8 mmol/L 4.8  Chloride 98 - 107 mmol/L 100  CO2 20.0 - 31.0 mmol/L 30.1  Anion Gap 5 - 14 mmol/L 11  BUN 9 - 23 mg/dL 23   Creatinine 9.44 - 1.02 mg/dL 9.17  BUN/Creatinine Ratio 28  eGFR CKD-EPI (2021) Female >=60 mL/min/1.66m2 79  Comment: eGFR calculated with CKD-EPI 2021 equation in accordance with [employer] and [employer] of Nephrology Task Force recommendations.  Glucose 70 - 179 mg/dL 879  Calcium 8.7 - 89.5 mg/dL 9.2  Albumin 3.4 - 5.0 g/dL 2.9 Low   Total Protein 5.7 - 8.2 g/dL 7  Total Bilirubin 0.3 - 1.2 mg/dL 0.3  AST <=65 U/L 16  ALT 10 - 49 U/L <7 Low   Alkaline Phosphatase 46 - 116 U/L 83  Resulting Agency [facility]    EKG   Radiology No results found.  Procedures Procedures (including critical care time)  Medications Ordered in UC Medications - No data to display  Initial Impression / Assessment and Plan / UC Course  I have reviewed the triage vital signs and the nursing notes.  Pertinent labs & imaging results that were  available during my care of the patient were reviewed by me and considered in my medical decision making (see chart for details).   68 y/o female presents from group home with 2 caregivers for dysuria and frequency for a few days.  Vitals normal and stable. Patient is overall well appearing.   UA obtained. Cloudy  urine with trace hgb, moderate leuks, and bacteria. Urine sent for culture.   Patient has normal renal function. No dose adjustment needed for antibiotic. Sent Keflex  500 mg TID x 7 days. Advised increasing fluids. Reviewed we may alter antibiotic based on culture. Discussed return precautions.    Final Clinical Impressions(s) / UC Diagnoses   Final diagnoses:  Acute cystitis with hematuria  Dysuria     Discharge Instructions      UTI: Based on either symptoms or urinalysis, you may have a urinary tract infection. We will send the urine for culture and call with results in a few days. Begin antibiotics at this time. Your symptoms should be much improved over the next 2-3 days. Increase rest  and fluid intake. If for some reason symptoms are worsening or not improving after a couple of days or the urine culture determines the antibiotics you are taking will not treat the infection, the antibiotics may be changed. Return or go to ER for fever, back pain, worsening urinary pain, discharge, increased blood in urine. May take Tylenol  or Motrin OTC for pain relief or consider AZO if no contraindications      ED Prescriptions     Medication Sig Dispense Auth. Provider   cephALEXin  (KEFLEX ) 500 MG capsule Take 1 capsule (500 mg total) by mouth 3 (three) times daily for 7 days. 21 capsule Arvis Jolan NOVAK, PA-C      PDMP not reviewed this encounter.   Arvis Jolan NOVAK, PA-C 12/18/23 1229

## 2023-12-18 NOTE — ED Triage Notes (Signed)
 Patient is here with care takers.  Care taker states that patient is urinating on herself and yelling when she urinates.

## 2023-12-20 LAB — URINE CULTURE: Culture: 40000 — AB

## 2023-12-22 ENCOUNTER — Ambulatory Visit (HOSPITAL_COMMUNITY): Payer: Self-pay

## 2024-02-16 ENCOUNTER — Ambulatory Visit: Admitting: Podiatry

## 2024-02-16 DIAGNOSIS — Z91198 Patient's noncompliance with other medical treatment and regimen for other reason: Secondary | ICD-10-CM

## 2024-02-16 NOTE — Progress Notes (Signed)
 1. Failure to attend appointment with reason given    Appointment rescheduled by patient.

## 2024-03-19 ENCOUNTER — Ambulatory Visit (INDEPENDENT_AMBULATORY_CARE_PROVIDER_SITE_OTHER): Admitting: Podiatry

## 2024-03-19 ENCOUNTER — Encounter: Payer: Self-pay | Admitting: Podiatry

## 2024-03-19 DIAGNOSIS — M79674 Pain in right toe(s): Secondary | ICD-10-CM | POA: Diagnosis not present

## 2024-03-19 DIAGNOSIS — B351 Tinea unguium: Secondary | ICD-10-CM

## 2024-03-19 DIAGNOSIS — M79675 Pain in left toe(s): Secondary | ICD-10-CM

## 2024-03-28 NOTE — Progress Notes (Signed)
  Subjective:  Patient ID: Carly Collins, female    DOB: [birth year],  MRN: [id number]  Carly Collins presents to clinic today for: preventative diabetic foot care for painful mycotic toenails of both feet that are difficult to trim. Pain interferes with daily activities and wearing enclosed shoe gear comfortably. She is accompanied by her caregiver on today's visit. Chief Complaint  Patient presents with   Toe Pain    Dr. Ora is her PCP. Last visit was in Aug. Denies being diabetic    PCP is Woodrow Kasandra SAUNDERS., MD.  Allergies  Allergen Reactions   Pioglitazone Other (See Comments)    unknown  Unknown to patient    Review of Systems: Negative except as noted in the HPI.  Objective: No changes noted in today's physical examination. There were no vitals filed for this visit.  Carly Collins is a pleasant 68 y.o. female WD, WN in NAD. AAO x 3.  Vascular Examination: Capillary refill time <3 seconds b/l LE. Palpable pedal pulses b/l LE. Digital hair present b/l. No pedal edema b/l. Skin temperature gradient WNL b/l. No varicosities b/l. No cyanosis or clubbing. No ischemia or gangrene. .  Dermatological Examination: Pedal skin with normal turgor, texture and tone b/l. No open wounds. No interdigital macerations b/l. Toenails 1-5 b/l thickened, discolored, dystrophic with subungual debris. There is pain on palpation to dorsal aspect of nailplates. No corns, calluses, nor porokeratotic lesions.  Neurological Examination: Protective sensation intact with 10 gram monofilament b/l LE. Vibratory sensation intact b/l LE.   Musculoskeletal Examination: Muscle strength 5/5 to all lower extremity muscle groups bilaterally. No pain, crepitus or joint limitation noted with ROM b/l LE. No gross bony pedal deformities b/l. Mobility via wheelchair assistance.  Assessment/Plan: 1. Pain due to onychomycosis of toenails of both feet   -Consent given for treatment as described below: -Examined  patient. -Patient to continue soft, supportive shoe gear daily. -Mycotic toenails 1-5 bilaterally were debrided in length and girth with sterile nail nippers and dremel. Pinpoint bleeding of L 2nd toe addressed with Lumicain Hemostatic Solution, cleansed with alcohol. Triple antibiotic ointment applied. No further treatment required by patient/caregiver. -Patient/POA to call should there be question/concern in the interim.   Return in about 3 months (around 06/19/2024).  Delon LITTIE Merlin, DPM      [place] LOCATION: [address] [place] [address]                   Office [phone]   [facility] LOCATION: [address] [place], [place] [address] Office [phone]

## 2024-04-12 ENCOUNTER — Ambulatory Visit
Admission: EM | Admit: 2024-04-12 | Discharge: 2024-04-12 | Disposition: A | Discharge status: Home / Self Care | Attending: Emergency Medicine | Admitting: Emergency Medicine

## 2024-04-12 ENCOUNTER — Encounter: Payer: Self-pay | Admitting: Emergency Medicine

## 2024-04-12 DIAGNOSIS — Z8639 Personal history of other endocrine, nutritional and metabolic disease: Secondary | ICD-10-CM | POA: Diagnosis present

## 2024-04-12 DIAGNOSIS — R300 Dysuria: Secondary | ICD-10-CM | POA: Insufficient documentation

## 2024-04-12 DIAGNOSIS — E119 Type 2 diabetes mellitus without complications: Secondary | ICD-10-CM | POA: Insufficient documentation

## 2024-04-12 DIAGNOSIS — N390 Urinary tract infection, site not specified: Secondary | ICD-10-CM | POA: Diagnosis present

## 2024-04-12 LAB — GLUCOSE, POCT (MANUAL RESULT ENTRY): POCT Glucose (KUC): 76 mg/dL (ref 70–99)

## 2024-04-12 LAB — POCT URINE DIPSTICK
Bilirubin, UA: NEGATIVE
Blood, UA: NEGATIVE
Glucose, UA: NEGATIVE mg/dL
Ketones, POC UA: NEGATIVE mg/dL
Nitrite, UA: NEGATIVE
Protein Ur, POC: NEGATIVE mg/dL
Spec Grav, UA: 1.015 (ref 1.010–1.025)
Urobilinogen, UA: 0.2 U/dL
pH, UA: 7.5 (ref 5.0–8.0)

## 2024-04-12 MED ORDER — CEPHALEXIN 500 MG PO CAPS: 500.0000 mg | ORAL_CAPSULE | Freq: Two times a day (BID) | ORAL | 0 refills | Status: DC

## 2024-04-12 MED ORDER — PHENAZOPYRIDINE HCL 200 MG PO TABS: 200.0000 mg | ORAL_TABLET | Freq: Three times a day (TID) | ORAL | 0 refills | Status: DC | PRN

## 2024-04-12 NOTE — Discharge Instructions (Signed)
 Urine dip is consistent with a urinary tract infection.  I am sending it off for culture to make sure that we have her on the correct antibiotic.  Finish the Keflex  even if she feels better.  Make sure she drinks plenty of fluids.  Pyridium will turn her urine orange, but should help with the pain with urination.  Follow-up with her primary care provider if she does not improve after finishing the antibiotics.  Go to the ER if she gets worse, worsening mental status, fevers above 100.4, sudden increase in her sugars, or further concerns.

## 2024-04-12 NOTE — ED Provider Notes (Signed)
 HPI  SUBJECTIVE:  Carly Collins is a 68 y.o. female who presents with dysuria and increased agitation starting today.  No other urinary symptoms per caregiver or adult daycare.  She has normal p.o. intake.  No diarrhea, apparent back or abdominal pain.  No apparent vaginal itching.  The caregiver has not noticed any odor, discharge or rash, irritation.  She had a normal bowel movement while here.  No antipyretic in the past 6 hours.  No antibiotics in the past month.  No aggravating or alleviating factors.  She has not tried anything for her symptoms.  She has a past medical history of diabetes, hyperlipidemia, hypertension, intellectual disability, chronic kidney disease stage III, recurrent UTI, C. difficile infection, BV.  She lives in a group home.    Urine culture August 2025 positive for E. coli susceptible to cephalosporins, fluoroquinolones, Macrobid, and Bactrim.  All history obtained from caregiver who picked her up from daycare.  Patient is nonverbal.  Past Medical History:  Diagnosis Date   Anxiety    C. difficile enteritis    Cancer (HCC)    Depression    Diabetes mellitus without complication (HCC)    GERD (gastroesophageal reflux disease)    Headache    Hyperlipidemia    Hypertension    Mental retardation, mild (I.Q. 50-70)    Moderate intellectual disabilities    Obesity    Recurrent UTI    Tibial fracture    Vitamin D  deficiency     Past Surgical History:  Procedure Laterality Date   ABDOMINAL SURGERY     APPENDECTOMY     BREAST SURGERY     COLON SURGERY     COLONOSCOPY N/A 10/26/2014   Procedure: COLONOSCOPY;  Surgeon: Gladis RAYMOND Mariner, MD;  Location: [facility] ENDOSCOPY;  Service: Endoscopy;  Laterality: N/A;   COLONOSCOPY N/A 01/21/2020   Procedure: COLONOSCOPY;  Surgeon: Dessa Reyes ORN, MD;  Location: [facility] ENDOSCOPY;  Service: Endoscopy;  Laterality: N/A;   DILATION AND CURETTAGE OF UTERUS     ESOPHAGOGASTRODUODENOSCOPY N/A 01/21/2020   Procedure:  ESOPHAGOGASTRODUODENOSCOPY (EGD);  Surgeon: Dessa Reyes ORN, MD;  Location: [facility] ENDOSCOPY;  Service: Endoscopy;  Laterality: N/A;   ESOPHAGOGASTRODUODENOSCOPY (EGD) WITH PROPOFOL  N/A 10/26/2014   Procedure: ESOPHAGOGASTRODUODENOSCOPY (EGD) WITH PROPOFOL ;  Surgeon: Gladis RAYMOND Mariner, MD;  Location: [facility] ENDOSCOPY;  Service: Endoscopy;  Laterality: N/A;   FRACTURE SURGERY     ORIF FEMUR FRACTURE Left 02/15/2022   Procedure: OPEN REDUCTION INTERNAL FIXATION (ORIF) DISTAL FEMUR FRACTURE;  Surgeon: Edie Norleen PARAS, MD;  Location: [facility] ORS;  Service: Orthopedics;  Laterality: Left;    Family History  Problem Relation Age of Onset   Other Mother    Other Father     Social History   Tobacco Use   Smoking status: Never   Smokeless tobacco: Never  Vaping Use   Vaping status: Never Used  Substance Use Topics   Alcohol use: No   Drug use: No    No current facility-administered medications for this encounter.  Current Outpatient Medications:    cephALEXin  (KEFLEX ) 500 MG capsule, Take 1 capsule (500 mg total) by mouth 2 (two) times daily., Disp: 14 capsule, Rfl: 0   phenazopyridine (PYRIDIUM) 200 MG tablet, Take 1 tablet (200 mg total) by mouth 3 (three) times daily as needed for pain., Disp: 6 tablet, Rfl: 0   acetaminophen  (MAPAP) 500 MG tablet, Take 1,000 mg by mouth 3 (three) times daily., Disp: , Rfl:    acetaminophen  (TYLENOL ) 325 MG tablet,  Take 325-650 mg by mouth every 6 (six) hours as needed for mild pain or moderate pain., Disp: , Rfl:    alendronate (FOSAMAX) 70 MG tablet, Take 70 mg by mouth every Saturday., Disp: , Rfl:    brexpiprazole  (REXULTI ) 1 MG TABS tablet, Take 1 mg by mouth in the morning., Disp: , Rfl:    Cholecalciferol  (VITAMIN D -1000 MAX ST) 25 MCG (1000 UT) tablet, Take 1,000 Units by mouth., Disp: , Rfl:    clonazePAM  (KLONOPIN ) 1 MG tablet, Take 0.5-1 mg by mouth See admin instructions. Take 1 tablet (1mg ) by mouth every morning and take  tablet (0.5mg ) by mouth  every night at bedtime, Disp: , Rfl:    Dentifrices (BIOTENE DRY MOUTH) PSTE, Place 1 Application onto teeth 3 (three) times daily., Disp: , Rfl:    divalproex  (DEPAKOTE  SPRINKLE) 125 MG capsule, Take 250-375 mg by mouth See admin instructions. Take 2 capsules (250mg ) by mouth every morning, 2 capsules (250mg ) every day at noon then take 3 capsules (375mg ) by mouth every night at bedtime, Disp: , Rfl:    docusate sodium  (COLACE) 100 MG capsule, Take 100 mg by mouth 2 (two) times daily as needed for mild constipation., Disp: , Rfl:    donepezil  (ARICEPT ) 10 MG tablet, Take 5 mg by mouth 2 (two) times daily., Disp: , Rfl:    DULoxetine  (CYMBALTA ) 30 MG capsule, Take 30 mg by mouth daily. (Take with 60mg  capsule to equal 90mg  total), Disp: , Rfl:    DULoxetine  (CYMBALTA ) 60 MG capsule, Take 60 mg by mouth daily. (Take with 30mg  capsule to equal 90mg  total), Disp: , Rfl:    enoxaparin  (LOVENOX ) 40 MG/0.4ML injection, Inject 0.4 mLs (40 mg total) into the skin daily., Disp: 5.6 mL, Rfl: 0   ferrous gluconate  (FERGON) 324 MG tablet, Take 1 tablet (324 mg total) by mouth 2 (two) times daily with a meal., Disp: , Rfl: 3   folic acid  (FOLVITE ) 400 MCG tablet, Take 800 mcg by mouth daily., Disp: , Rfl:    guaiFENesin  (DIABETIC TUSSIN EX) 100 MG/5ML liquid, Take 10 mLs by mouth every 6 (six) hours as needed for cough or to loosen phlegm., Disp: , Rfl:    HYDROcodone -acetaminophen  (NORCO) 7.5-325 MG tablet, Take 1 tablet by mouth every 6 (six) hours as needed for moderate pain or severe pain., Disp: 30 tablet, Rfl: 0   hydrOXYzine (ATARAX) 25 MG tablet, Take 25 mg by mouth 2 (two) times daily., Disp: , Rfl:    letrozole  (FEMARA ) 2.5 MG tablet, Take 2.5 mg by mouth daily., Disp: , Rfl:    loperamide (IMODIUM) 2 MG capsule, Take 2 mg by mouth as needed for diarrhea or loose stools., Disp: , Rfl:    magnesium  hydroxide (MILK OF MAGNESIA) 400 MG/5ML suspension, Take 30 mLs by mouth daily as needed for mild  constipation or moderate constipation., Disp: , Rfl:    memantine  (NAMENDA ) 10 MG tablet, Take 10 mg by mouth 2 (two) times daily., Disp: , Rfl:    mirtazapine  (REMERON ) 7.5 MG tablet, Take 7.5 mg by mouth at bedtime., Disp: , Rfl:    Mouthwashes (BIOTENE/CALCIUM PBF) LIQD, Use as directed 15 mLs in the mouth or throat 2 (two) times daily., Disp: , Rfl:    nystatin  (MYCOSTATIN /NYSTOP ) powder, Apply 1 Application topically every 12 (twelve) hours as needed (fungal symptoms in skin folds)., Disp: , Rfl:    nystatin -triamcinolone  (MYCOLOG II) cream, Apply 1 Application topically 2 (two) times daily as needed (skin eruptions).,  Disp: , Rfl:    pantoprazole  (PROTONIX ) 40 MG tablet, Take 1 tablet (40 mg total) by mouth in the morning., Disp: 30 tablet, Rfl: 0   phenylephrine -shark liver oil-mineral oil-petrolatum (PREPARATION H) 0.25-14-74.9 % rectal ointment, Place 1 Application rectally daily as needed for hemorrhoids., Disp: , Rfl:    polyethylene glycol (MIRALAX  / GLYCOLAX ) packet, Take 17 g by mouth at bedtime., Disp: , Rfl:    pseudoephedrine (SUDAFED) 30 MG tablet, Take 30 mg by mouth every 6 (six) hours as needed for congestion., Disp: , Rfl:    QUEtiapine  (SEROQUEL ) 300 MG tablet, Take 300 mg by mouth at bedtime., Disp: , Rfl:    QUEtiapine  (SEROQUEL ) 50 MG tablet, Take 50 mg by mouth 2 (two) times daily., Disp: , Rfl:   Allergies  Allergen Reactions   Pioglitazone Other (See Comments)    unknown  Unknown to patient     ROS  As noted in HPI.   Physical Exam  BP (!) 177/88 (BP Location: Left Arm)   Pulse (!) 57   Temp 97.7 F (36.5 C) (Oral)   Resp 18   SpO2 95%  BP Readings from Last 3 Encounters:  04/12/24 (!) 177/88  12/18/23 116/75  09/30/23 (!) 168/89    Constitutional: Well developed, well nourished, agitated. Eyes:  EOMI, conjunctiva normal bilaterally HENT: Normocephalic, atraumatic,mucus membranes moist Respiratory: Normal inspiratory effort Cardiovascular:  Normal rate GI: nondistended no apparent suprapubic, flank tenderness.  Active bowel sounds.  No rebound, guarding. Back: No CVAT skin: No rash, skin intact Musculoskeletal: no deformities Neurologic: Alert & oriented x 3, no focal neuro deficits Psychiatric: Speech and behavior appropriate   ED Course   Medications - No data to display  Orders Placed This Encounter  Procedures   Urine Culture    Standing Status:   Standing    Number of Occurrences:   1    Indication:   Dysuria   Offer Fluids    Standing Status:   Standing    Number of Occurrences:   20   POC Urinalysis Dipstick    Standing Status:   Standing    Number of Occurrences:   1   POCT glucose (manual entry)    Standing Status:   Standing    Number of Occurrences:   1    Results for orders placed or performed during the hospital encounter of 04/12/24 (from the past 24 hours)  POC Urinalysis Dipstick     Status: Abnormal   Collection Time: 04/12/24  4:36 PM  Result Value Ref Range   Color, UA straw (A) yellow   Clarity, UA clear clear   Glucose, UA negative negative mg/dL   Bilirubin, UA negative negative   Ketones, POC UA negative negative mg/dL   Spec Grav, UA 8.984 8.989 - 1.025   Blood, UA negative negative   pH, UA 7.5 5.0 - 8.0   Protein Ur, POC negative negative mg/dL   Urobilinogen, UA 0.2 0.2 or 1.0 E.U./dL   Nitrite, UA Negative Negative   Leukocytes, UA Small (1+) (A) Negative  POCT glucose (manual entry)     Status: Normal   Collection Time: 04/12/24  4:42 PM  Result Value Ref Range   POCT Glucose ([facility]) 76 70 - 99 mg/dL   No results found.  ED Clinical Impression  1. Urinary tract infection without hematuria, site unspecified   2. Dysuria   3. History of diabetes mellitus   4. Type 2 diabetes mellitus without complications, unspecified whether  long term insulin use [facility])      ED Assessment/Plan     Outside labs, records reviewed.  As noted in HPI  Last available CMP done in  February 2025.  Calculated creatinine clearance from those labs 75 mL/min.  Checking UA and glucose to rule out hypoglycemia as a cause of her agitation.  Glucose 76.  UA with small leukocytes.  Will send off for culture to confirm antibiotic choice. Presentation consistent with recurrent UTI.  Will send home with Keflex  500 mg p.o. twice daily for 7 days.  This does not need to be renally dosed.  Can also give Pyridium as creatinine clearance is above 50 mL/min.  Discussed labs, imaging, MDM, treatment plan, and plan for follow-up with caregiver discussed sn/sx that should prompt return to the ED. she agrees with plan.  Meds ordered this encounter  Medications   cephALEXin  (KEFLEX ) 500 MG capsule    Sig: Take 1 capsule (500 mg total) by mouth 2 (two) times daily.    Dispense:  14 capsule    Refill:  0   phenazopyridine (PYRIDIUM) 200 MG tablet    Sig: Take 1 tablet (200 mg total) by mouth 3 (three) times daily as needed for pain.    Dispense:  6 tablet    Refill:  0      *This clinic note was created using Scientist, clinical (histocompatibility and immunogenetics). Therefore, there may be occasional mistakes despite careful proofreading.  ?    Van Knee, MD 04/15/24 [phone]

## 2024-04-12 NOTE — ED Triage Notes (Signed)
Pt presents with dysuria that started today.  

## 2024-04-13 LAB — URINE CULTURE: Culture: NO GROWTH

## 2024-04-15 ENCOUNTER — Ambulatory Visit (HOSPITAL_COMMUNITY): Payer: Self-pay

## 2024-04-29 ENCOUNTER — Ambulatory Visit
Admission: EM | Admit: 2024-04-29 | Discharge: 2024-04-29 | Disposition: A | Discharge status: Home / Self Care | Attending: Nurse Practitioner | Admitting: Nurse Practitioner

## 2024-04-29 DIAGNOSIS — L304 Erythema intertrigo: Secondary | ICD-10-CM

## 2024-04-29 MED ORDER — CLOTRIMAZOLE 1 % EX OINT: TOPICAL_OINTMENT | CUTANEOUS | 0 refills | Status: AC

## 2024-04-29 MED ORDER — FLUCONAZOLE 150 MG PO TABS: 150.0000 mg | ORAL_TABLET | ORAL | 0 refills | Status: AC

## 2024-04-29 NOTE — Discharge Instructions (Addendum)
 Carly Collins was seen today for a rash in her skin folds, groin, and vaginal area. This rash is called intertrigo and is caused by moisture and irritation in areas where skin rubs together. At this time, there are no signs of a bacterial infection, which is reassuring.  Please apply the clotrimazole  ointment to the affected areas twice daily as prescribed. Before applying the medication, gently clean the skin and make sure it is completely dry. Carly Collins was also prescribed fluconazole  (Diflucan ) to take by mouth every 72 hours for a total of two doses; please give this medication exactly as directed. Stop using the Mycolog cream and nystatin  powder, as these are no longer recommended for her treatment.  It is very important to keep these areas as dry as possible to help the rash heal and prevent it from returning. Change incontinent briefs promptly after they become wet or soiled. You may use a barrier cream such as zinc oxide to protect the skin from moisture. When possible, allow the affected areas to be exposed to air, especially at night. Loose, breathable clothing can also help reduce moisture and friction.  Follow up with Carly Collins primary care provider if the rash does not start to improve within one to two weeks or if it worsens. Seek emergency care right away if she develops increasing redness, swelling, pain, open sores, drainage, fever, or if she appears significantly uncomfortable or unwell.

## 2024-04-29 NOTE — ED Triage Notes (Signed)
 Patient presents to UC with her caregivers for abdominal rash that spread to vaginal area noted yesterday. The caregiver reports using nystatin  cream with no improvement.

## 2024-04-29 NOTE — ED Provider Notes (Signed)
 [facility]    CSN: [id number] Arrival date & time: 04/29/24  1125      History   Chief Complaint Chief Complaint  Patient presents with   Rash    HPI Carly Collins is a 68 y.o. female.   Use of AI scribe software for clinical note transcription was discussed with the patient's caregiver, and verbal consent was obtained to proceed.  The patient has a history significant for developmental delay, diabetes mellitus, hypertension, hyperlipidemia, major depressive disorder with psychotic features, gastroesophageal reflux disease, and obesity. She resides in a group home, and her baseline mental status prevents meaningful participation in discussions regarding her care. History was therefore obtained from group home caregivers.  The patient presents for evaluation of a rash involving the lower abdominal region and genital area. Caregivers report the rash is located in the lower abdominal fold and has extended into the bilateral groin and vaginal area. The patient is incontinent and wears diapers. Caregivers report she has experienced similar rashes in the past. Over the past two days, caregivers have been consistently applying nystatin -triamcinolone  cream along with nystatin  powder. They note the rash may improve slightly with treatment but never fully resolves and tends to recur. The patient has not had any fevers or systemic symptoms. The rash reportedly looks much better today compared to yesterday.   Caregivers report this is a recurrent issue despite repeated treatment attempts. They also note challenges with maintaining consistent hygiene, toileting, and treatment adherence due to variability in caregiving practices among staff members.  The following sections of the patient's history were reviewed and updated as appropriate: allergies, current medications, past family history, past medical history, past social history, past surgical history, and problem list.    The  following two photos were obtained from the caregiver's phone. Pictures of the affected area was taken on 04/28/24.        Past Medical History:  Diagnosis Date   Anxiety    C. difficile enteritis    Cancer (HCC)    Depression    Diabetes mellitus without complication (HCC)    GERD (gastroesophageal reflux disease)    Headache    Hyperlipidemia    Hypertension    Mental retardation, mild (I.Q. 50-70)    Moderate intellectual disabilities    Obesity    Recurrent UTI    Tibial fracture    Vitamin D  deficiency     Patient Active Problem List   Diagnosis Date Noted   BV (bacterial vaginosis) 03/27/2023   Femoral distal fracture (HCC) 02/14/2022   Acute UTI 02/13/2022   Acute metabolic encephalopathy 02/13/2022   Intertriginous dermatitis associated with moisture 02/13/2022   COVID-19 virus infection 02/13/2022   Hypertension    Pain due to onychomycosis of toenails of both feet 12/14/2020   History of abnormal mammogram 01/28/2019   Recurrent UTI 01/28/2019   Chronic kidney disease (CKD), stage III (moderate) (HCC) 11/11/2018   Long-term use of high-risk medication 10/13/2018   Anxiety 07/31/2017   Anxiety and depression 10/16/2016   GERD (gastroesophageal reflux disease) 10/16/2016   Hyperlipidemia associated with type 2 diabetes mellitus (HCC) 08/29/2016   Moderate intellectual disabilities 11/15/2015   IDA (iron  deficiency anemia) 09/22/2014   Closed fracture of distal end of left humerus 06/08/2014   Headache 11/30/2013   Breast cancer (HCC) 04/18/2013   History of Clostridium difficile 10/30/2012   Symptomatic cholelithiasis 10/26/2012   Closed fracture of condyle of femur (HCC) 10/05/2012   Closed fracture of proximal tibia  10/05/2012   Cognitive deficits 10/05/2012   Diabetes mellitus (HCC) 10/05/2012   Hypertension associated with diabetes (HCC) 10/05/2012   Hyperlipidemia, unspecified 10/05/2012   Obesity 10/05/2012   Vitamin D  deficiency, unspecified  10/05/2012   Urinary tract infection 10/05/2012   Left tibial fracture 09/10/2012   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   Vitamin B12 deficiency 09/02/2000    Past Surgical History:  Procedure Laterality Date   ABDOMINAL SURGERY     APPENDECTOMY     BREAST SURGERY     COLON SURGERY     COLONOSCOPY N/A 10/26/2014   Procedure: COLONOSCOPY;  Surgeon: Gladis RAYMOND Mariner, MD;  Location[facility] ENDOSCOPY;  Service: Endoscopy;  Laterality: N/A;   COLONOSCOPY N/A 01/21/2020   Procedure: COLONOSCOPY;  Surgeon: Dessa Reyes ORN, MD;  Location: [facility] ENDOSCOPY;  Service: Endoscopy;  Laterality: N/A;   DILATION AND CURETTAGE OF UTERUS     ESOPHAGOGASTRODUODENOSCOPY N/A 01/21/2020   Procedure: ESOPHAGOGASTRODUODENOSCOPY (EGD);  Surgeon: Dessa Reyes ORN, MD;  Location: [facility] ENDOSCOPY;  Service: Endoscopy;  Laterality: N/A;   ESOPHAGOGASTRODUODENOSCOPY (EGD) WITH PROPOFOL  N/A 10/26/2014   Procedure: ESOPHAGOGASTRODUODENOSCOPY (EGD) WITH PROPOFOL ;  Surgeon: Gladis RAYMOND Mariner, MD;  Location: [facility] ENDOSCOPY;  Service: Endoscopy;  Laterality: N/A;   FRACTURE SURGERY     ORIF FEMUR FRACTURE Left 02/15/2022   Procedure: OPEN REDUCTION INTERNAL FIXATION (ORIF) DISTAL FEMUR FRACTURE;  Surgeon: Edie Norleen PARAS, MD;  Location: [facility] ORS;  Service: Orthopedics;  Laterality: Left;    OB History     Gravida  0   Para  0   Term  0   Preterm  0   AB  0   Living  0      SAB  0   IAB  0   Ectopic  0   Multiple  0   Live Births  0            Home Medications    Prior to Admission medications  Medication Sig Start Date End Date Taking? Authorizing Provider  Clotrimazole  1 % OINT Apply thin layer of ointment to affected areas twice a day for up to 2 weeks. 04/29/24  Yes Iola Lukes, FNP  fluconazole  (DIFLUCAN ) 150 MG tablet Take 1 tablet (150 mg total) by mouth every 3 (three) days for 2 doses. 04/29/24 05/03/24 Yes Iola Lukes, FNP  acetaminophen   (MAPAP) 500 MG tablet Take 1,000 mg by mouth 3 (three) times daily.    [provider]  acetaminophen  (TYLENOL ) 325 MG tablet Take 325-650 mg by mouth every 6 (six) hours as needed for mild pain or moderate pain.    [provider]  alendronate (FOSAMAX) 70 MG tablet Take 70 mg by mouth every Saturday.    [provider]  brexpiprazole  (REXULTI ) 1 MG TABS tablet Take 1 mg by mouth in the morning.    [provider]  cephALEXin  (KEFLEX ) 500 MG capsule Take 1 capsule (500 mg total) by mouth 2 (two) times daily. 04/12/24   Van Knee, MD  Cholecalciferol  (VITAMIN D -1000 MAX ST) 25 MCG (1000 UT) tablet Take 1,000 Units by mouth.    [provider]  clonazePAM  (KLONOPIN ) 1 MG tablet Take 0.5-1 mg by mouth See admin instructions. Take 1 tablet (1mg ) by mouth every morning and take  tablet (0.5mg ) by mouth every night at bedtime    [provider]  Dentifrices (BIOTENE DRY MOUTH) PSTE Place 1 Application onto teeth 3 (three) times daily.    [provider]  divalproex  (DEPAKOTE  SPRINKLE) 125 MG capsule Take 250-375 mg by mouth See admin instructions. Take 2 capsules (250mg ) by mouth every morning, 2 capsules (250mg ) every day at noon then take 3 capsules (375mg ) by mouth every night at bedtime    [provider]  docusate sodium  (COLACE) 100 MG capsule Take 100 mg by mouth 2 (two) times daily as needed for mild constipation.    [provider]  donepezil  (ARICEPT ) 10 MG tablet Take 5 mg by mouth 2 (two) times daily.    [provider]  DULoxetine  (CYMBALTA ) 30 MG capsule Take 30 mg by mouth daily. (Take with 60mg  capsule to equal 90mg  total)    [provider]  DULoxetine  (CYMBALTA ) 60 MG capsule Take 60 mg by mouth daily. (Take with 30mg  capsule to equal 90mg  total)    [provider]  enoxaparin  (LOVENOX ) 40 MG/0.4ML injection Inject 0.4 mLs (40 mg total) into the skin daily. 02/20/22   Kip Lynwood Double, PA-C  ferrous gluconate  (FERGON) 324 MG tablet Take 1 tablet (324 mg total) by mouth 2 (two) times daily with a meal. 02/22/22   Caleen Qualia, MD  folic acid  (FOLVITE ) 400 MCG tablet Take 800 mcg by mouth daily.    [provider]  guaiFENesin  (DIABETIC TUSSIN EX) 100 MG/5ML liquid Take 10 mLs by mouth every 6 (six) hours as needed for cough or to loosen phlegm.    [provider]  HYDROcodone -acetaminophen  (NORCO) 7.5-325 MG tablet Take 1 tablet by mouth every 6 (six) hours as needed for moderate pain or severe pain. 02/17/22   Charlene Debby BROCKS, PA-C  hydrOXYzine (ATARAX) 25 MG tablet Take 25 mg by mouth 2 (two) times daily.    [provider]  letrozole  (FEMARA ) 2.5 MG tablet Take 2.5 mg by mouth daily.    [provider]  loperamide (IMODIUM) 2 MG capsule Take 2 mg by mouth as needed for diarrhea or loose stools.    [provider]  magnesium  hydroxide (MILK OF MAGNESIA) 400 MG/5ML suspension Take 30 mLs by mouth daily as needed for mild constipation or moderate constipation.    [provider]  memantine  (NAMENDA ) 10 MG tablet Take 10 mg by mouth 2 (two) times daily.    [provider]  mirtazapine  (REMERON ) 7.5 MG tablet Take 7.5 mg by mouth at bedtime.    [provider]  Mouthwashes (BIOTENE/CALCIUM PBF) LIQD Use as directed 15 mLs in the mouth or throat 2 (two) times daily.    [provider]  pantoprazole  (PROTONIX ) 40 MG tablet Take 1 tablet (40 mg total) by mouth in the morning. 03/08/22 03/19/24  Floy Roberts, MD  phenazopyridine  (PYRIDIUM ) 200 MG tablet Take 1 tablet (200 mg total) by mouth 3 (three) times daily as needed for pain. 04/12/24   Van Knee, MD  phenylephrine -shark liver oil-mineral oil-petrolatum (PREPARATION H) 0.25-14-74.9 % rectal ointment Place 1 Application rectally daily as needed for hemorrhoids.    [provider]  polyethylene glycol (MIRALAX  / GLYCOLAX )  packet Take 17 g by mouth at bedtime.    [provider]  pseudoephedrine (SUDAFED) 30 MG tablet Take 30 mg by mouth every 6 (six) hours as needed for congestion.    [provider]  QUEtiapine  (SEROQUEL ) 300 MG tablet Take 300 mg by mouth at bedtime.    [provider]  QUEtiapine  (SEROQUEL ) 50 MG tablet Take 50 mg by mouth 2 (two) times daily.    [provider]  sitaGLIPtin (JANUVIA) 100 MG tablet Take 100 mg by mouth daily.  01/17/19  [provider]    Family History Family History  Problem Relation Age of Onset   Other Mother    Other Father     Social History Social History[1]   Allergies   Pioglitazone   Review of Systems Review of Systems  Constitutional:  Negative for fever.  Skin:  Positive for rash.  All other systems reviewed and are negative.    Physical Exam Triage Vital Signs ED Triage Vitals [04/29/24 1354]  Encounter Vitals Group     BP (!) 139/57     Girls Systolic BP Percentile      Girls Diastolic BP Percentile      Boys Systolic BP Percentile      Boys Diastolic BP Percentile      Pulse Rate 71     Resp 18     Temp 98.2 F (36.8 C)     Temp Source Temporal     SpO2 96 %     Weight      Height      Head Circumference      Peak Flow      Pain Score      Pain Loc      Pain Education      Exclude from Growth Chart    No data found.  Updated Vital Signs BP (!) 139/57 (BP Location: Left Arm)   Pulse 71   Temp 98.2 F (36.8 C) (Temporal)   Resp 18   SpO2 96%   Visual Acuity Right Eye Distance:   Left Eye Distance:   Bilateral Distance:    Right Eye Near:   Left Eye Near:    Bilateral Near:     Physical Exam Vitals reviewed.  Constitutional:      General: She is awake. She is not in acute distress.    Appearance: Normal appearance. She is not ill-appearing, toxic-appearing or diaphoretic.  HENT:     Head: Normocephalic.     Right Ear: Hearing normal.     Left Ear: Hearing normal.      Nose: Nose normal.     Mouth/Throat:     Mouth: Mucous membranes are moist.  Eyes:     General: Vision grossly intact.     Conjunctiva/sclera: Conjunctivae normal.  Cardiovascular:     Rate and Rhythm: Normal rate.     Heart sounds: Normal heart sounds.  Pulmonary:     Effort: Pulmonary effort is normal.     Breath sounds: Normal breath sounds and air entry.  Musculoskeletal:        General: Normal range of motion.     Cervical back: Normal range of motion and neck supple.  Skin:    General: Skin is warm and dry.     Findings: Rash present.     Comments: An erythematous, shiny rash involving the lower abdominal folds, bilateral groin, and external vaginal area with extension toward the buttocks. The affected areas are tender to palpation. Thick, pasty topical cream is noted scattered within the abdominal folds, along with damp pieces of gauze. No foul odor, drainage, open areas, ulceration, or signs of secondary bacterial infection are observed.  Neurological:     General: No focal deficit present.     Mental Status: She is alert. Mental status is at baseline.  Psychiatric:        Speech: Speech normal.      UC Treatments / Results  Labs (all labs ordered are listed, but only abnormal results are displayed) Labs Reviewed - No data to display  EKG   Radiology No results found.  Procedures Procedures (including critical care time)  Medications Ordered in UC Medications - No data to display  Initial Impression / Assessment and Plan / UC Course  I have reviewed the triage vital signs and the nursing notes.  Pertinent labs & imaging results that were available during my care of the patient were reviewed by me and considered in my medical decision making (see chart for details).    The patient presents with a worsening rash involving the abdominal folds, groin, and vaginal area. The appearance and distribution of the rash are consistent with intertrigo, without  clinical evidence of superimposed bacterial infection at this time. Management focuses on treatment of suspected fungal involvement and moisture control. The patient was prescribed clotrimazole  ointment to be applied to clean, completely dry affected areas twice daily for up to two weeks, along with oral fluconazole  150 mg every 72 hours for a total of two doses. Supportive care and prevention strategies were discussed in detail, including keeping skin folds as dry as possible, using barrier protection such as zinc oxide, changing incontinent diapers frequently, and allowing the affected areas exposure to air when feasible, particularly at night. Caregivers was advised to discontinue use of Mycolog cream and nystatin  powder. They were instructed to follow up with her primary care provider if symptoms do not improve or worsen, and to seek further evaluation if signs of secondary infection develop, including increasing pain, spreading redness, drainage, fever, or skin breakdown.  Today's evaluation has revealed no signs of a dangerous process. Discussed diagnosis with patient and/or guardian. Patient and/or guardian aware of their diagnosis, possible red flag symptoms to watch out for and need for close follow up. Patient and/or guardian understands verbal and written discharge instructions. Patient and/or guardian comfortable with plan and disposition.  Patient and/or guardian has a clear mental status at this time, good insight into illness (after discussion and teaching) and has clear judgment to make decisions regarding their care  Documentation was completed with the aid of voice recognition software. Transcription may contain typographical errors.   Final Clinical Impressions(s) / UC Diagnoses   Final diagnoses:  Intertrigo     Discharge Instructions      Mackenzye was seen today for a rash in her skin folds, groin, and vaginal area. This rash is called intertrigo and is caused by moisture and  irritation in areas where skin rubs together. At this time, there are no signs of a bacterial infection, which is reassuring.  Please apply the clotrimazole  ointment to the affected areas twice daily as prescribed. Before applying the medication, gently clean the skin and make sure it is completely dry. Collins was also prescribed fluconazole  (Diflucan ) to take by mouth every 72 hours for a total of two doses; please give this medication exactly as directed. Stop using the Mycolog cream and nystatin  powder, as these are no longer recommended for her treatment.  It is very important to keep these areas as dry as possible to help the rash heal and prevent it from returning. Change incontinent briefs promptly after they become wet or soiled. You may use a barrier cream such as zinc oxide to protect the skin from moisture. When possible, allow the affected areas to be exposed to air, especially at night. Loose, breathable clothing can also help reduce moisture and friction.  Follow up with  Denises primary care provider if the rash does not start to improve within one to two weeks or if it worsens. Seek emergency care right away if she develops increasing redness, swelling, pain, open sores, drainage, fever, or if she appears significantly uncomfortable or unwell.      ED Prescriptions     Medication Sig Dispense Auth. Provider   Clotrimazole  1 % OINT Apply thin layer of ointment to affected areas twice a day for up to 2 weeks. 56.7 g Iola Lukes, FNP   fluconazole  (DIFLUCAN ) 150 MG tablet Take 1 tablet (150 mg total) by mouth every 3 (three) days for 2 doses. 2 tablet Iola Lukes, FNP      PDMP not reviewed this encounter.     [1]  Social History Tobacco Use   Smoking status: Never   Smokeless tobacco: Never  Vaping Use   Vaping status: Never Used  Substance Use Topics   Alcohol use: No   Drug use: No     Iola Lukes, FNP 04/29/24 1518

## 2024-05-12 ENCOUNTER — Ambulatory Visit
Admission: EM | Admit: 2024-05-12 | Discharge: 2024-05-12 | Disposition: A | Discharge status: Home / Self Care | Source: Home / Self Care

## 2024-05-12 DIAGNOSIS — N3001 Acute cystitis with hematuria: Secondary | ICD-10-CM | POA: Diagnosis not present

## 2024-05-12 DIAGNOSIS — H1032 Unspecified acute conjunctivitis, left eye: Secondary | ICD-10-CM | POA: Insufficient documentation

## 2024-05-12 LAB — POCT URINE DIPSTICK
Bilirubin, UA: NEGATIVE
Blood, UA: NEGATIVE
Glucose, UA: NEGATIVE mg/dL
Ketones, POC UA: NEGATIVE mg/dL
Nitrite, UA: NEGATIVE
Protein Ur, POC: NEGATIVE mg/dL
Spec Grav, UA: 1.015
Urobilinogen, UA: 0.2 U/dL
pH, UA: 7

## 2024-05-12 MED ORDER — CEPHALEXIN 500 MG PO CAPS: 500.0000 mg | ORAL_CAPSULE | Freq: Two times a day (BID) | ORAL | 0 refills | Status: AC

## 2024-05-12 MED ORDER — POLYMYXIN B-TRIMETHOPRIM 10000-0.1 UNIT/ML-% OP SOLN: 1.0000 [drp] | Freq: Four times a day (QID) | OPHTHALMIC | 0 refills | Status: AC

## 2024-05-12 NOTE — ED Triage Notes (Signed)
 Pt c/o urinary freq,burning & pain x1 day. Denies any hematuria. No OTC meds.  Also c/o L eye irritation & drainage x1 day.

## 2024-05-12 NOTE — Discharge Instructions (Addendum)
 Start antibiotic eyedrops as prescribed.  Warm compresses to the eye as needed.  Start Keflex  twice daily for 7 days to treat your urinary tract infection.  The clinical contact you with results of the urine culture done today if positive.  Lots of rest and fluids.  Please follow-up with your PCP in 2 to 3 days for recheck.  Please go to the ER for any worsening symptoms.  Hope you feel better soon!

## 2024-05-12 NOTE — ED Provider Notes (Signed)
 " [facility]    CSN: [id number] Arrival date & time: 05/12/24  1557      History   Chief Complaint Chief Complaint  Patient presents with   Dysuria   Eye Problem    HPI Carly Collins is a 68 y.o. female presents with caretaker for evaluation of dysuria and eye drainage.  Caretakers report onset of urinary burning with some frequency and odor x 1 day.  No fevers, vomiting or complaints of back or flank pain.  No vaginal discharge.  In addition reports left eye drainage/irritation x 1 day.  Patient wears glasses no contacts.  No URI symptoms.  Denies injury to the eye, visual changes.  No OTC medications have been used since onset.  No other concerns at this time.   Dysuria Eye Problem Associated symptoms: discharge and redness     Past Medical History:  Diagnosis Date   Anxiety    C. difficile enteritis    Cancer (HCC)    Depression    Diabetes mellitus without complication (HCC)    GERD (gastroesophageal reflux disease)    Headache    Hyperlipidemia    Hypertension    Mental retardation, mild (I.Q. 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   BV (bacterial vaginosis) 03/27/2023   Oropharyngeal dysphagia 12/16/2022   Femoral distal fracture (HCC) 02/14/2022   Closed displaced supracondylar fracture of distal end of left femur without intracondylar extension (HCC) 02/14/2022   Acute UTI 02/13/2022   Acute metabolic encephalopathy 02/13/2022   Intertriginous dermatitis associated with moisture 02/13/2022   Hypertension    COVID-19 02/11/2022   Vulvar candidiasis 01/08/2022   Candidiasis of skin 08/15/2021   Other specified symptoms and signs involving the circulatory and respiratory systems 07/03/2021   Pain due to onychomycosis of toenails of both feet 12/14/2020   Unintentional weight loss  01/11/2020   Senile osteoporosis 02/24/2019   History of abnormal mammogram 01/28/2019   Recurrent UTI 01/28/2019   Stage 2 chronic kidney disease 11/11/2018   Long-term use of high-risk medication 10/13/2018   Other long term (current) drug therapy 10/13/2018   Anxiety 07/31/2017   Recurrent major depressive episodes, moderate (HCC) 10/16/2016   GERD (gastroesophageal reflux disease) 10/16/2016   Hyperlipidemia associated with type 2 diabetes mellitus (HCC) 08/29/2016   History of malignant neoplasm of breast 08/29/2016   Moderate intellectual disability 11/15/2015   Iron  deficiency anemia 09/22/2014   Closed fracture of distal end of left humerus 06/08/2014   Headache 11/30/2013   Malignant neoplasm of female breast (HCC) 04/18/2013   History of Clostridium difficile 10/30/2012   Symptomatic cholelithiasis 10/26/2012   Closed fracture of condyle of femur (HCC) 10/05/2012   Closed fracture of proximal tibia 10/05/2012   Dementia (HCC) 10/05/2012   Type 2 diabetes mellitus (HCC) 10/05/2012   Hypertension associated with diabetes (HCC) 10/05/2012   Hyperlipidemia, unspecified 10/05/2012   Vitamin D  deficiency, unspecified 10/05/2012   Urinary tract infection 10/05/2012   Left tibial fracture 09/10/2012   Obesity 12/24/2011   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   Vitamin B12 deficiency 09/02/2000    Past Surgical History:  Procedure Laterality Date   ABDOMINAL SURGERY     APPENDECTOMY     BREAST SURGERY  COLON SURGERY     COLONOSCOPY N/A 10/26/2014   Procedure: COLONOSCOPY;  Surgeon: Gladis RAYMOND Mariner, MD;  Location: [facility] ENDOSCOPY;  Service: Endoscopy;  Laterality: N/A;   COLONOSCOPY N/A 01/21/2020   Procedure: COLONOSCOPY;  Surgeon: Dessa Reyes ORN, MD;  Location: [facility] ENDOSCOPY;  Service: Endoscopy;  Laterality: N/A;   DILATION AND CURETTAGE OF UTERUS     ESOPHAGOGASTRODUODENOSCOPY N/A 01/21/2020   Procedure: ESOPHAGOGASTRODUODENOSCOPY  (EGD);  Surgeon: Dessa Reyes ORN, MD;  Location: [facility] ENDOSCOPY;  Service: Endoscopy;  Laterality: N/A;   ESOPHAGOGASTRODUODENOSCOPY (EGD) WITH PROPOFOL  N/A 10/26/2014   Procedure: ESOPHAGOGASTRODUODENOSCOPY (EGD) WITH PROPOFOL ;  Surgeon: Gladis RAYMOND Mariner, MD;  Location: [facility] ENDOSCOPY;  Service: Endoscopy;  Laterality: N/A;   FRACTURE SURGERY     ORIF FEMUR FRACTURE Left 02/15/2022   Procedure: OPEN REDUCTION INTERNAL FIXATION (ORIF) DISTAL FEMUR FRACTURE;  Surgeon: Edie Norleen PARAS, MD;  Location: [facility] ORS;  Service: Orthopedics;  Laterality: Left;    OB History     Gravida  0   Para  0   Term  0   Preterm  0   AB  0   Living  0      SAB  0   IAB  0   Ectopic  0   Multiple  0   Live Births  0            Home Medications    Prior to Admission medications  Medication Sig Start Date End Date Taking? Authorizing Provider  cephALEXin  (KEFLEX ) 500 MG capsule Take 1 capsule (500 mg total) by mouth 2 (two) times daily for 7 days. 05/12/24 05/19/24 Yes Kelon Easom, Jodi R, NP  trimethoprim-polymyxin b (POLYTRIM) ophthalmic solution Place 1 drop into the left eye every 6 (six) hours for 5 days. 05/12/24 05/17/24 Yes Glendene Wyer, Jodi R, NP  acetaminophen  (MAPAP) 500 MG tablet Take 1,000 mg by mouth 3 (three) times daily.    [provider]  acetaminophen  (TYLENOL ) 325 MG tablet Take 325-650 mg by mouth every 6 (six) hours as needed for mild pain or moderate pain.    [provider]  alendronate (FOSAMAX) 70 MG tablet Take 70 mg by mouth every Saturday.    [provider]  brexpiprazole  (REXULTI ) 1 MG TABS tablet Take 1 mg by mouth in the morning.    [provider]  Cholecalciferol  (VITAMIN D -1000 MAX ST) 25 MCG (1000 UT) tablet Take 1,000 Units by mouth.    [provider]  clonazePAM  (KLONOPIN ) 1 MG tablet Take 0.5-1 mg by mouth See admin instructions. Take 1 tablet (1mg ) by mouth every morning and take  tablet (0.5mg ) by mouth every night at  bedtime    [provider]  Clotrimazole  1 % OINT Apply thin layer of ointment to affected areas twice a day for up to 2 weeks. 04/29/24   Murrill, Samantha, FNP  Dentifrices (BIOTENE DRY MOUTH) PSTE Place 1 Application onto teeth 3 (three) times daily.    [provider]  divalproex  (DEPAKOTE  SPRINKLE) 125 MG capsule Take 250-375 mg by mouth See admin instructions. Take 2 capsules (250mg ) by mouth every morning, 2 capsules (250mg ) every day at noon then take 3 capsules (375mg ) by mouth every night at bedtime    [provider]  docusate sodium  (COLACE) 100 MG capsule Take 100 mg by mouth 2 (two) times daily as needed for mild constipation.    [provider]  donepezil  (ARICEPT ) 10 MG tablet Take 5 mg by mouth 2 (two) times  daily.    [provider]  DULoxetine  (CYMBALTA ) 30 MG capsule Take 30 mg by mouth daily. (Take with 60mg  capsule to equal 90mg  total)    [provider]  DULoxetine  (CYMBALTA ) 60 MG capsule Take 60 mg by mouth daily. (Take with 30mg  capsule to equal 90mg  total)    [provider]  enoxaparin  (LOVENOX ) 40 MG/0.4ML injection Inject 0.4 mLs (40 mg total) into the skin daily. 02/20/22   Kip Lynwood Double, PA-C  ferrous gluconate  (FERGON) 324 MG tablet Take 1 tablet (324 mg total) by mouth 2 (two) times daily with a meal. 02/22/22   Caleen Qualia, MD  folic acid  (FOLVITE ) 400 MCG tablet Take 800 mcg by mouth daily.    [provider]  guaiFENesin  (DIABETIC TUSSIN EX) 100 MG/5ML liquid Take 10 mLs by mouth every 6 (six) hours as needed for cough or to loosen phlegm.    [provider]  HYDROcodone -acetaminophen  (NORCO) 7.5-325 MG tablet Take 1 tablet by mouth every 6 (six) hours as needed for moderate pain or severe pain. 02/17/22   Charlene Debby BROCKS, PA-C  hydrOXYzine (ATARAX) 25 MG tablet Take 25 mg by mouth 2 (two) times daily.    [provider]  letrozole  (FEMARA ) 2.5 MG tablet Take 2.5 mg by  mouth daily.    [provider]  loperamide (IMODIUM) 2 MG capsule Take 2 mg by mouth as needed for diarrhea or loose stools.    [provider]  magnesium  hydroxide (MILK OF MAGNESIA) 400 MG/5ML suspension Take 30 mLs by mouth daily as needed for mild constipation or moderate constipation.    [provider]  memantine  (NAMENDA ) 10 MG tablet Take 10 mg by mouth 2 (two) times daily.    [provider]  mirtazapine  (REMERON ) 7.5 MG tablet Take 7.5 mg by mouth at bedtime.    [provider]  Mouthwashes (BIOTENE/CALCIUM PBF) LIQD Use as directed 15 mLs in the mouth or throat 2 (two) times daily.    [provider]  pantoprazole  (PROTONIX ) 40 MG tablet Take 1 tablet (40 mg total) by mouth in the morning. 03/08/22 03/19/24  Floy Roberts, MD  phenazopyridine  (PYRIDIUM ) 200 MG tablet Take 1 tablet (200 mg total) by mouth 3 (three) times daily as needed for pain. 04/12/24   Van Knee, MD  phenylephrine -shark liver oil-mineral oil-petrolatum (PREPARATION H) 0.25-14-74.9 % rectal ointment Place 1 Application rectally daily as needed for hemorrhoids.    [provider]  polyethylene glycol (MIRALAX  / GLYCOLAX ) packet Take 17 g by mouth at bedtime.    [provider]  pseudoephedrine (SUDAFED) 30 MG tablet Take 30 mg by mouth every 6 (six) hours as needed for congestion.    [provider]  QUEtiapine  (SEROQUEL ) 300 MG tablet Take 300 mg by mouth at bedtime.    [provider]  QUEtiapine  (SEROQUEL ) 50 MG tablet Take 50 mg by mouth 2 (two) times daily.    [provider]  triamcinolone  cream (KENALOG) 0.1 % SMARTSIG:1 Application Topical 2-3 Times Daily    [provider]  sitaGLIPtin (JANUVIA) 100 MG tablet Take 100 mg by mouth daily.  01/17/19  [provider]    Family History Family History  Problem Relation Age of Onset   Other Mother    Other Father     Social  History Social History[1]   Allergies   Pioglitazone   Review of Systems Review of Systems  Eyes:  Positive for discharge and redness.  Genitourinary:  Positive for dysuria.     Physical Exam Triage Vital Signs ED Triage Vitals  Encounter Vitals Group     BP 05/12/24 1653 (!) 171/87     Girls Systolic BP Percentile --      Girls Diastolic BP Percentile --      Boys Systolic BP Percentile --      Boys Diastolic BP Percentile --      Pulse Rate 05/12/24 1653 73     Resp 05/12/24 1653 18     Temp 05/12/24 1653 (!) 97.4 F (36.3 C)     Temp Source 05/12/24 1653 Oral     SpO2 05/12/24 1653 95 %     Weight --      Height --      Head Circumference --      Peak Flow --      Pain Score 05/12/24 1652 0     Pain Loc --      Pain Education --      Exclude from Growth Chart --    No data found.  Updated Vital Signs BP (!) 171/87 (BP Location: Right Arm)   Pulse 73   Temp (!) 97.4 F (36.3 C) (Oral)   Resp 18   SpO2 95%   Visual Acuity Right Eye Distance:   Left Eye Distance:   Bilateral Distance:    Right Eye Near:   Left Eye Near:    Bilateral Near:     Physical Exam Vitals and nursing note reviewed.  Constitutional:      General: She is not in acute distress.    Appearance: Normal appearance. She is not ill-appearing.     Comments: Patient in wheelchair  HENT:     Head: Normocephalic and atraumatic.  Eyes:     General: Lids are normal.        Right eye: No foreign body, discharge or hordeolum.        Left eye: Discharge present.No foreign body or hordeolum.     Conjunctiva/sclera:     Right eye: Right conjunctiva is not injected. No chemosis, exudate or hemorrhage.    Left eye: Left conjunctiva is injected. No chemosis, exudate or hemorrhage.    Pupils: Pupils are equal, round, and reactive to light.     Comments: Purulent drainage from left conjunctiva noted  Cardiovascular:     Rate and Rhythm: Normal rate.  Pulmonary:     Effort: Pulmonary effort  is normal.  Skin:    General: Skin is warm and dry.  Neurological:     General: No focal deficit present.     Mental Status: She is alert and oriented to person, place, and time.  Psychiatric:        Mood and Affect: Mood normal.        Behavior: Behavior normal.      UC Treatments / Results  Labs (all labs ordered are listed, but only abnormal results are displayed) Labs Reviewed  POCT URINE DIPSTICK - Abnormal; Notable for the following components:      Result Value   Leukocytes, UA Moderate (2+) (*)    All other components within normal limits  URINE CULTURE    EKG   Radiology No results found.  Procedures Procedures (including critical care time)  Medications Ordered in UC Medications - No data to display  Initial Impression / Assessment and Plan / UC Course  I have reviewed the triage vital signs and the nursing notes.  Pertinent  labs & imaging results that were available during my care of the patient were reviewed by me and considered in my medical decision making (see chart for details).     Reviewed exam and symptoms with caregivers.  Urine positive for UTI, will culture and start Keflex . Start Polytrim antibiotic eyedrops for left conjunctivitis.  Encouraged warm compresses.  Advise rest fluids and PCP follow-up 2-3 days for recheck.  ER precautions reviewed. Final Clinical Impressions(s) / UC Diagnoses   Final diagnoses:  Acute bacterial conjunctivitis of left eye  Acute cystitis with hematuria     Discharge Instructions      Start antibiotic eyedrops as prescribed.  Warm compresses to the eye as needed.  Start Keflex  twice daily for 7 days to treat your urinary tract infection.  The clinical contact you with results of the urine culture done today if positive.  Lots of rest and fluids.  Please follow-up with your PCP in 2 to 3 days for recheck.  Please go to the ER for any worsening symptoms.  Hope you feel better soon!    ED Prescriptions      Medication Sig Dispense Auth. Provider   trimethoprim-polymyxin b (POLYTRIM) ophthalmic solution Place 1 drop into the left eye every 6 (six) hours for 5 days. 10 mL Garnetta Fedrick, Jodi R, NP   cephALEXin  (KEFLEX ) 500 MG capsule Take 1 capsule (500 mg total) by mouth 2 (two) times daily for 7 days. 14 capsule Zaidin Blyden, Jodi R, NP      PDMP not reviewed this encounter.    [1]  Social History Tobacco Use   Smoking status: Never   Smokeless tobacco: Never  Vaping Use   Vaping status: Never Used  Substance Use Topics   Alcohol use: No   Drug use: No     Loreda Myla SAUNDERS, NP 05/12/24 1732  "

## 2024-05-14 ENCOUNTER — Ambulatory Visit (HOSPITAL_COMMUNITY): Payer: Self-pay

## 2024-05-14 LAB — URINE CULTURE: Culture: <10000 — AB

## 2024-05-17 ENCOUNTER — Other Ambulatory Visit: Payer: Self-pay

## 2024-05-17 ENCOUNTER — Emergency Department

## 2024-05-17 ENCOUNTER — Emergency Department
Admission: EM | Admit: 2024-05-17 | Discharge: 2024-05-17 | Disposition: A | Discharge status: Home / Self Care | Source: Other Acute Inpatient Hospital | Attending: Emergency Medicine | Admitting: Emergency Medicine

## 2024-05-17 DIAGNOSIS — Y92121 Bathroom in nursing home as the place of occurrence of the external cause: Secondary | ICD-10-CM | POA: Insufficient documentation

## 2024-05-17 DIAGNOSIS — M542 Cervicalgia: Secondary | ICD-10-CM | POA: Insufficient documentation

## 2024-05-17 DIAGNOSIS — W19XXXA Unspecified fall, initial encounter: Secondary | ICD-10-CM | POA: Diagnosis not present

## 2024-05-17 DIAGNOSIS — J189 Pneumonia, unspecified organism: Secondary | ICD-10-CM | POA: Diagnosis not present

## 2024-05-17 DIAGNOSIS — D72829 Elevated white blood cell count, unspecified: Secondary | ICD-10-CM | POA: Diagnosis not present

## 2024-05-17 DIAGNOSIS — S0990XA Unspecified injury of head, initial encounter: Secondary | ICD-10-CM | POA: Diagnosis present

## 2024-05-17 DIAGNOSIS — S0081XA Abrasion of other part of head, initial encounter: Secondary | ICD-10-CM | POA: Insufficient documentation

## 2024-05-17 LAB — BASIC METABOLIC PANEL WITH GFR
Anion gap: 9 (ref 5–15)
BUN: 27 mg/dL — ABNORMAL HIGH (ref 8–23)
CO2: 30 mmol/L (ref 22–32)
Calcium: 9.2 mg/dL (ref 8.9–10.3)
Chloride: 101 mmol/L (ref 98–111)
Creatinine, Ser: 1.07 mg/dL — ABNORMAL HIGH (ref 0.44–1.00)
GFR, Estimated: 56 mL/min — ABNORMAL LOW
Glucose, Bld: 128 mg/dL — ABNORMAL HIGH (ref 70–99)
Potassium: 4.6 mmol/L (ref 3.5–5.1)
Sodium: 140 mmol/L (ref 135–145)

## 2024-05-17 LAB — CBC
HCT: 33.3 % — ABNORMAL LOW (ref 36.0–46.0)
Hemoglobin: 10.9 g/dL — ABNORMAL LOW (ref 12.0–15.0)
MCH: 28.4 pg (ref 26.0–34.0)
MCHC: 32.7 g/dL (ref 30.0–36.0)
MCV: 86.7 fL (ref 80.0–100.0)
Platelets: 254 K/uL (ref 150–400)
RBC: 3.84 MIL/uL — ABNORMAL LOW (ref 3.87–5.11)
RDW: 15.1 % (ref 11.5–15.5)
WBC: 13.6 K/uL — ABNORMAL HIGH (ref 4.0–10.5)
nRBC: 0 % (ref 0.0–0.2)

## 2024-05-17 LAB — TROPONIN T, HIGH SENSITIVITY
Troponin T High Sensitivity: 31 ng/L — ABNORMAL HIGH (ref 0–19)
Troponin T High Sensitivity: 28 ng/L — ABNORMAL HIGH (ref 0–19)

## 2024-05-17 LAB — URINALYSIS, W/ REFLEX TO CULTURE (INFECTION SUSPECTED)
Bacteria, UA: NONE SEEN
Bilirubin Urine: NEGATIVE
Glucose, UA: NEGATIVE mg/dL
Hgb urine dipstick: NEGATIVE
Ketones, ur: NEGATIVE mg/dL
Leukocytes,Ua: NEGATIVE
Nitrite: NEGATIVE
Protein, ur: NEGATIVE mg/dL
RBC / HPF: 0 RBC/hpf (ref 0–5)
Specific Gravity, Urine: 1.019 (ref 1.005–1.030)
pH: 6 (ref 5.0–8.0)

## 2024-05-17 LAB — CBG MONITORING, ED: Glucose-Capillary: 122 mg/dL — ABNORMAL HIGH (ref 70–99)

## 2024-05-17 MED ORDER — AMOXICILLIN-POT CLAVULANATE 875-125 MG PO TABS: 1.0000 | ORAL_TABLET | Freq: Two times a day (BID) | ORAL | 0 refills | Status: AC

## 2024-05-17 MED ORDER — AZITHROMYCIN 250 MG PO TABS: 250.0000 mg | ORAL_TABLET | Freq: Every day | ORAL | 0 refills | Status: DC

## 2024-05-17 MED ORDER — SODIUM CHLORIDE 0.9 % IV SOLN
500.0000 mg | Freq: Once | INTRAVENOUS | Status: AC
  Administered 2024-05-17: 500 mg via INTRAVENOUS
  Filled 2024-05-17: qty 5

## 2024-05-17 MED ORDER — DOXYCYCLINE MONOHYDRATE 100 MG PO TABS: 100.0000 mg | ORAL_TABLET | Freq: Two times a day (BID) | ORAL | 0 refills | Status: AC

## 2024-05-17 MED ORDER — SODIUM CHLORIDE 0.9 % IV SOLN
1.0000 g | Freq: Once | INTRAVENOUS | Status: AC
  Administered 2024-05-17: 1 g via INTRAVENOUS
  Filled 2024-05-17: qty 10

## 2024-05-17 NOTE — Discharge Instructions (Addendum)
 Ms. Treloar was seen in the emergency department following a fall.  Her CT scan of her head did not show any traumatic injuries.  Her chest x-ray was concerning for possible multifocal pneumonia.  She did have a white count on her lab work.  She was given her first dose of antibiotics in the emergency department with 1 g of IV Rocephin  and azithromycin .  She was given a prescription to start on 2 different antibiotics with Augmentin  and doxycycline .  Follow-up closely with her primary care physician and return to the emergency department if she has any worsening symptoms or if her symptoms do not improve in the next couple of days.  Doxycycline  - This medication can cause acid reflux.  It is important that you take it with food and drink plenty of water.  Do not lie down for 1 hour after taking this medication.  It also causes sun sensitivity so stay out of the sun or wear SPF while on this medication.

## 2024-05-17 NOTE — ED Notes (Signed)
 Fall risk bracelet on and bed alarm on.

## 2024-05-17 NOTE — ED Provider Notes (Addendum)
 "  [facility] Provider Note    Event Date/Time   First MD Initiated Contact with Patient 05/17/24 0701     (approximate)   History   Fall   HPI  Carly Collins is a 69 y.o. female patient presents to the emergency department from a group home after an unwitnessed fall.  Patient arrived via EMS from a group home and [place].  Unwitnessed fall in the bedroom and had a abrasion over the right eye.  Was alert and oriented just to self.  At baseline patient has delayed cognitive ability but uncertain of what the baseline is.  Patient is unable to participate in my exam.  Able to state her name but is not oriented to year or place.  Noted to be aroused multiple times to wake up and not answering basic questions.  On chart review patient has a history of intellectual disability, comes from a group home and has multiple ER visits for falls and altered mental status.  According to EMS patient had taken a large dose of gabapentin last night and is on multiple psychiatric medications.  Not on any anticoagulation.     Physical Exam   Triage Vital Signs: ED Triage Vitals  Encounter Vitals Group     BP 05/17/24 0659 129/66     Girls Systolic BP Percentile --      Girls Diastolic BP Percentile --      Boys Systolic BP Percentile --      Boys Diastolic BP Percentile --      Pulse Rate 05/17/24 0659 68     Resp 05/17/24 0659 16     Temp 05/17/24 0659 97.6 F (36.4 C)     Temp Source 05/17/24 0659 Oral     SpO2 05/17/24 0658 95 %     Weight 05/17/24 0700 172 lb 2.9 oz (78.1 kg)     Height 05/17/24 0700 5' 6 (1.676 m)     Head Circumference --      Peak Flow --      Pain Score 05/17/24 0700 0     Pain Loc --      Pain Education --      Exclude from Growth Chart --     Most recent vital signs: Vitals:   05/17/24 1155 05/17/24 1230  BP: 136/79 124/67  Pulse: (!) 57 72  Resp: 15 15  Temp:    SpO2: 98% 100%    Physical Exam Constitutional:      Appearance:  She is well-developed.  HENT:     Head:     Comments: Abrasion to the right forehead.  Pupils are equal and reactive. Eyes:     Extraocular Movements: Extraocular movements intact.     Conjunctiva/sclera: Conjunctivae normal.     Pupils: Pupils are equal, round, and reactive to light.  Cardiovascular:     Rate and Rhythm: Regular rhythm.  Pulmonary:     Effort: No respiratory distress.     Breath sounds: No wheezing.  Abdominal:     General: There is no distension.     Tenderness: There is no abdominal tenderness.  Musculoskeletal:        General: Normal range of motion.     Cervical back: Normal range of motion. Tenderness present.     Right lower leg: No edema.     Left lower leg: No edema.  Skin:    General: Skin is warm.     Capillary Refill: Capillary refill  takes less than 2 seconds.  Neurological:     General: No focal deficit present.     Mental Status: She is alert. She is disoriented.     Comments: Somnolent, arouses to noxious stimuli, not following simple commands.     IMPRESSION / MDM / ASSESSMENT AND PLAN / ED COURSE  I reviewed the triage vital signs and the nursing notes.  Differential diagnosis including intracranial hemorrhage, metabolic encephalopathy, infectious process including urinary tract infection, medication side effect, substance use  EKG  I, Clotilda Punter, the attending physician, personally viewed and interpreted this ECG.   Rate: Normal  Rhythm: Normal sinus  Axis: Normal  Intervals: Normal  ST&T Change: None  No tachycardic or bradycardic dysrhythmias while on cardiac telemetry.  RADIOLOGY I independently reviewed imaging, my interpretation of imaging: CT scan of the cervical spine -no acute fracture but did note patchy opacities in the lung apex.  CT scan of the head -no traumatic injury.  Acute on chronic paranasal sinusitis.  Chest x-ray concerning for opacities  LABS (all labs ordered are listed, but only abnormal results are  displayed) Labs interpreted as -    Labs Reviewed  CBC - Abnormal; Notable for the following components:      Result Value   WBC 13.6 (*)    RBC 3.84 (*)    Hemoglobin 10.9 (*)    HCT 33.3 (*)    All other components within normal limits  BASIC METABOLIC PANEL WITH GFR - Abnormal; Notable for the following components:   Glucose, Bld 128 (*)    BUN 27 (*)    Creatinine, Ser 1.07 (*)    GFR, Estimated 56 (*)    All other components within normal limits  URINALYSIS, W/ REFLEX TO CULTURE (INFECTION SUSPECTED) - Abnormal; Notable for the following components:   Color, Urine YELLOW (*)    APPearance CLEAR (*)    All other components within normal limits  BLOOD GAS, VENOUS - Abnormal; Notable for the following components:   Bicarbonate 34.5 (*)    Acid-Base Excess 7.6 (*)    All other components within normal limits  CBG MONITORING, ED - Abnormal; Notable for the following components:   Glucose-Capillary 122 (*)    All other components within normal limits  TROPONIN T, HIGH SENSITIVITY - Abnormal; Notable for the following components:   Troponin T High Sensitivity 31 (*)    All other components within normal limits  TROPONIN T, HIGH SENSITIVITY - Abnormal; Notable for the following components:   Troponin T High Sensitivity 28 (*)    All other components within normal limits     MDM   Clinical Course as of 05/17/24 1354  Mon May 17, 2024  0900 Patient woke up, asking the nurse for chips. [SM]  1045 CT scan of the head with no signs of intracranial hemorrhage or infarction.  CT scan of the cervical spine read as no acute traumatic injury, questionable opacities on lung exam.  Patient does have mild leukocytosis of 13.6.  Anemia with a hemoglobin at 10.9 but I do not see any signs or symptoms of a GI bleed.  Creatinine appears to be at her baseline with no significant electrolyte abnormality.  No significant hypercarbia on VBG.  Urine with no signs of urinary tract infection.   Adding on a chest x-ray. [SM]  1202 On reevaluation patient states she is feeling fine, is able to state her name, where she is and is asking for lunch.  Called  and discussed with her facility and her legal guardian who states that this is at her baseline.  Does state that she has had a recent cough.  Given first dose of antibiotics in the emergency department.  Patient has been 100% on room air with no signs of respiratory distress.  Does have some mild leukocytosis but is otherwise well-appearing.  Given first dose of antibiotics in the emergency department with IV Rocephin  and azithromycin .  Discharged with a prescription for Augmentin  and doxycycline .  Discussed with her facility and her legal guardian that if she had any worsening symptoms to return immediately to the emergency department.  They felt safe with her going back to her facility.  Discussed close follow-up with her primary care physician.  Noted acute sinusitis on her CT scan, was started on Augmentin  which should cover both acute sinusitis and pneumonia.  Serial troponins were stable, no chest pain, have a low suspicion for ACS.  Patient discharged home in stable condition. [SM]    Clinical Course User Index [SM] Suzanne Kirsch, MD     PROCEDURES:  Critical Care performed: No  Procedures  Patient's presentation is most consistent with acute presentation with potential threat to life or bodily function.   MEDICATIONS ORDERED IN ED: Medications  azithromycin  (ZITHROMAX ) 500 mg in sodium chloride  0.9 % 250 mL IVPB (500 mg Intravenous New Bag/Given 05/17/24 1319)  cefTRIAXone  (ROCEPHIN ) 1 g in sodium chloride  0.9 % 100 mL IVPB (0 g Intravenous Stopped 05/17/24 1226)    FINAL CLINICAL IMPRESSION(S) / ED DIAGNOSES   Final diagnoses:  Fall, initial encounter  Community acquired pneumonia, unspecified laterality     Rx / DC Orders   ED Discharge Orders          Ordered    amoxicillin -clavulanate (AUGMENTIN ) 875-125 MG  tablet  2 times daily        05/17/24 1146    azithromycin  (ZITHROMAX ) 250 MG tablet  Daily,   Status:  Discontinued        05/17/24 1146    doxycycline  (ADOXA) 100 MG tablet  2 times daily        05/17/24 1147             Note:  This document was prepared using Dragon voice recognition software and may include unintentional dictation errors.   Suzanne Kirsch, MD 05/17/24 1203    Suzanne Kirsch, MD 05/17/24 1354  "

## 2024-05-17 NOTE — ED Triage Notes (Signed)
 Pt arrives from [facility] in [place] via [facility].  C/O unwitnessed fall in bedroom.  Hematoma above rt eye. Pt A&Ox2, NAD.  Pt baseline cognition unclear.  Per EMS pt had large dose of gabapentin last night and is on multiple psychiatric medications. No blood thinners.

## 2024-05-17 NOTE — ED Notes (Signed)
 Pt's caregiver verbalizes understanding of discharge instructions. Opportunity for questioning and answers were provided. Pt discharged from ED to group home with caregiver.

## 2024-05-18 LAB — BLOOD GAS, VENOUS
Acid-Base Excess: 7.6 mmol/L — ABNORMAL HIGH (ref 0.0–2.0)
Bicarbonate: 34.5 mmol/L — ABNORMAL HIGH (ref 20.0–28.0)
O2 Saturation: 31.9 %
Patient temperature: 37
pCO2, Ven: 57 mmHg (ref 44–60)
pH, Ven: 7.39 (ref 7.25–7.43)

## 2024-05-25 ENCOUNTER — Ambulatory Visit
Admission: EM | Admit: 2024-05-25 | Discharge: 2024-05-25 | Disposition: A | Discharge status: Home / Self Care | Attending: Emergency Medicine | Admitting: Emergency Medicine

## 2024-05-25 DIAGNOSIS — L304 Erythema intertrigo: Secondary | ICD-10-CM

## 2024-05-25 MED ORDER — MICONAZOLE NITRATE 2 % EX OINT: 1.0000 | TOPICAL_OINTMENT | Freq: Two times a day (BID) | CUTANEOUS | 0 refills | Status: AC

## 2024-05-25 MED ORDER — MUPIROCIN 2 % EX OINT: 1.0000 | TOPICAL_OINTMENT | Freq: Two times a day (BID) | CUTANEOUS | 0 refills | Status: AC

## 2024-05-25 NOTE — ED Provider Notes (Signed)
 HPI  SUBJECTIVE:  Carly Collins is a 69 y.o. female who presents with an erythematous, symmetric rash noted on either breast, along her navel, under her pannus into the genital region noticed yesterday by staff.  Caregiver who brings her in is not sure if it itches or burns.  No vulvar swelling.  No odor, purulent drainage.  The caregiver applied an unknown prescription cream x 1 yesterday without improvement in her symptoms.  No aggravating factors.  She has a past medical history of diabetes, hypertension, intellectual disabilities, recurrent UTI, C. difficile colitis, stage II chronic kidney disease, dementia, intertriginous dermatitis, BV, skin yeast infection.   She was started on 7 days Keflex  on 12/31 for presumed UTI, urine culture came back negative for infection, antibiotics were to be discontinued.  Caregiver who brings her in is not sure if this actually happened.  All history obtained from caregiver.  Patient is nonverbal.  Past Medical History:  Diagnosis Date   Anxiety    C. difficile enteritis    Cancer (HCC)    Depression    Diabetes mellitus without complication (HCC)    GERD (gastroesophageal reflux disease)    Headache    Hyperlipidemia    Hypertension    Mental retardation, mild (I.Q. 50-70)    Moderate intellectual disabilities    Obesity    Recurrent UTI    Tibial fracture    Vitamin D  deficiency     Past Surgical History:  Procedure Laterality Date   ABDOMINAL SURGERY     APPENDECTOMY     BREAST SURGERY     COLON SURGERY     COLONOSCOPY N/A 10/26/2014   Procedure: COLONOSCOPY;  Surgeon: Gladis RAYMOND Mariner, MD;  Location: [facility] ENDOSCOPY;  Service: Endoscopy;  Laterality: N/A;   COLONOSCOPY N/A 01/21/2020   Procedure: COLONOSCOPY;  Surgeon: Dessa Reyes ORN, MD;  Location: [facility] ENDOSCOPY;  Service: Endoscopy;  Laterality: N/A;   DILATION AND CURETTAGE OF UTERUS     ESOPHAGOGASTRODUODENOSCOPY N/A 01/21/2020   Procedure: ESOPHAGOGASTRODUODENOSCOPY (EGD);   Surgeon: Dessa Reyes ORN, MD;  Location: [facility] ENDOSCOPY;  Service: Endoscopy;  Laterality: N/A;   ESOPHAGOGASTRODUODENOSCOPY (EGD) WITH PROPOFOL  N/A 10/26/2014   Procedure: ESOPHAGOGASTRODUODENOSCOPY (EGD) WITH PROPOFOL ;  Surgeon: Gladis RAYMOND Mariner, MD;  Location: [facility] ENDOSCOPY;  Service: Endoscopy;  Laterality: N/A;   FRACTURE SURGERY     ORIF FEMUR FRACTURE Left 02/15/2022   Procedure: OPEN REDUCTION INTERNAL FIXATION (ORIF) DISTAL FEMUR FRACTURE;  Surgeon: Edie Norleen PARAS, MD;  Location: [facility] ORS;  Service: Orthopedics;  Laterality: Left;    Family History  Problem Relation Age of Onset   Other Mother    Other Father     Social History[1]  Current Medications[2]  Allergies[3]   ROS  As noted in HPI.   Physical Exam  BP 135/80 (BP Location: Left Arm)   Pulse 66   Temp 98 F (36.7 C) (Oral)   Resp 14   Wt 78.1 kg   SpO2 99%   BMI 27.80 kg/m   Constitutional: Well developed, well nourished, no acute distress Eyes:  EOMI, conjunctiva normal bilaterally HENT: Normocephalic, atraumatic,mucus membranes moist Respiratory: Normal inspiratory effort Cardiovascular: Normal rate GI: nondistended skin: Blanchable, nontender, erythematous, symmetric rash under breasts     Blanchable, nontender, erythematous rash in umbilicus/pannus   Blanchable, nontender, erythematous, symmetric rash under pannus, bilateral groin.     Caregiver declined a chaperone. Musculoskeletal: no deformities Neurologic: Alert & oriented x 3, no focal neuro deficits Psychiatric: Speech and behavior appropriate  ED Course   Medications - No data to display  No orders of the defined types were placed in this encounter.   No results found for this or any previous visit (from the past 24 hours). No results found.  ED Clinical Impression  1. Intertrigo      ED Assessment/Plan     Previous records, outside labs reviewed.  As noted in HPI.  Calculated creatinine clearance done  from earlier this month 62 mL/min.   Patient presents with intertrigo, I am concerned that she has a candidal and secondary bacterial yeast infection.  Vies daily cleansing with a gentle skin cleanser followed by drying the affected area with a hair dryer on a cool setting.  Sending home with miconazole  2% and Bactroban  twice a day for 2 to 4 weeks.  They are to apply a barrier cream over the ointment.  I was going to try fluconazole  150 mg once weekly for 4 weeks per up-to-date recommendations, but it prolongs the QT with Seroquel , which is listed on the patient's med list   discussed with caregiver that I am not prescribing the Diflucan  at this time due to concerns for prolonged QT.  Caregiver is not sure if patient is actually taking the Seroquel  or not.  They are to follow-up with her PCP if she does not get better in several days.  Consider Diflucan  if patient is not taking Seroquel .  Discussed  MDM, treatment plan, and plan for follow-up with caregiver.  She agrees with plan.   Meds ordered this encounter  Medications   mupirocin  ointment (BACTROBAN ) 2 %    Sig: Apply 1 Application topically 2 (two) times daily.    Dispense:  22 g    Refill:  0   Miconazole  Nitrate 2 % OINT    Sig: Apply 1 Application topically 2 (two) times daily. Apply until rash is gone and for 1 week thereafter    Dispense:  56 g    Refill:  0      *This clinic note was created using Scientist, clinical (histocompatibility and immunogenetics). Therefore, there may be occasional mistakes despite careful proofreading.  ?      [1]  Social History Tobacco Use   Smoking status: Never   Smokeless tobacco: Never  Vaping Use   Vaping status: Never Used  Substance Use Topics   Alcohol use: No   Drug use: No  [2] No current facility-administered medications for this encounter.  Current Outpatient Medications:    alendronate (FOSAMAX) 70 MG tablet, Take 70 mg by mouth every Saturday., Disp: , Rfl:    amLODipine (NORVASC) 5 MG tablet, Take 1  tablet by mouth daily., Disp: , Rfl:    brexpiprazole  (REXULTI ) 1 MG TABS tablet, Take 1 mg by mouth in the morning., Disp: , Rfl:    buPROPion (WELLBUTRIN XL) 150 MG 24 hr tablet, Take 1 tablet by mouth daily., Disp: , Rfl:    Cholecalciferol  (VITAMIN D -1000 MAX ST) 25 MCG (1000 UT) tablet, Take 1,000 Units by mouth., Disp: , Rfl:    clonazePAM  (KLONOPIN ) 1 MG tablet, Take 0.5-1 mg by mouth See admin instructions. Take 1 tablet (1mg ) by mouth every morning and take  tablet (0.5mg ) by mouth every night at bedtime, Disp: , Rfl:    divalproex  (DEPAKOTE  SPRINKLE) 125 MG capsule, Take 250-375 mg by mouth See admin instructions. Take 2 capsules (250mg ) by mouth every morning, 2 capsules (250mg ) every day at noon then take 3 capsules (375mg ) by mouth every night  at bedtime, Disp: , Rfl:    DULoxetine  (CYMBALTA ) 30 MG capsule, Take 30 mg by mouth daily. (Take with 60mg  capsule to equal 90mg  total), Disp: , Rfl:    DULoxetine  (CYMBALTA ) 60 MG capsule, Take 60 mg by mouth daily. (Take with 30mg  capsule to equal 90mg  total), Disp: , Rfl:    enoxaparin  (LOVENOX ) 40 MG/0.4ML injection, Inject 0.4 mLs (40 mg total) into the skin daily., Disp: 5.6 mL, Rfl: 0   hydrOXYzine (ATARAX) 25 MG tablet, Take 25 mg by mouth 2 (two) times daily., Disp: , Rfl:    letrozole  (FEMARA ) 2.5 MG tablet, Take 2.5 mg by mouth daily., Disp: , Rfl:    Miconazole  Nitrate 2 % OINT, Apply 1 Application topically 2 (two) times daily. Apply until rash is gone and for 1 week thereafter, Disp: 56 g, Rfl: 0   mupirocin  ointment (BACTROBAN ) 2 %, Apply 1 Application topically 2 (two) times daily., Disp: 22 g, Rfl: 0   QUEtiapine  (SEROQUEL ) 300 MG tablet, Take 300 mg by mouth at bedtime., Disp: , Rfl:    QUEtiapine  (SEROQUEL ) 50 MG tablet, Take 50 mg by mouth 2 (two) times daily., Disp: , Rfl:    acetaminophen  (MAPAP) 500 MG tablet, Take 1,000 mg by mouth 3 (three) times daily., Disp: , Rfl:    acetaminophen  (TYLENOL ) 325 MG tablet, Take 325-650  mg by mouth every 6 (six) hours as needed for mild pain or moderate pain., Disp: , Rfl:    [Paused] Clotrimazole  1 % OINT, Apply thin layer of ointment to affected areas twice a day for up to 2 weeks., Disp: 56.7 g, Rfl: 0   Dentifrices (BIOTENE DRY MOUTH) PSTE, Place 1 Application onto teeth 3 (three) times daily., Disp: , Rfl:    docusate sodium  (COLACE) 100 MG capsule, Take 100 mg by mouth 2 (two) times daily as needed for mild constipation., Disp: , Rfl:    donepezil  (ARICEPT ) 10 MG tablet, Take 5 mg by mouth 2 (two) times daily., Disp: , Rfl:    ferrous gluconate  (FERGON) 324 MG tablet, Take 1 tablet (324 mg total) by mouth 2 (two) times daily with a meal., Disp: , Rfl: 3   folic acid  (FOLVITE ) 400 MCG tablet, Take 800 mcg by mouth daily., Disp: , Rfl:    guaiFENesin  (DIABETIC TUSSIN EX) 100 MG/5ML liquid, Take 10 mLs by mouth every 6 (six) hours as needed for cough or to loosen phlegm., Disp: , Rfl:    HYDROcodone -acetaminophen  (NORCO) 7.5-325 MG tablet, Take 1 tablet by mouth every 6 (six) hours as needed for moderate pain or severe pain., Disp: 30 tablet, Rfl: 0   loperamide (IMODIUM) 2 MG capsule, Take 2 mg by mouth as needed for diarrhea or loose stools., Disp: , Rfl:    magnesium  hydroxide (MILK OF MAGNESIA) 400 MG/5ML suspension, Take 30 mLs by mouth daily as needed for mild constipation or moderate constipation., Disp: , Rfl:    memantine  (NAMENDA ) 10 MG tablet, Take 10 mg by mouth 2 (two) times daily., Disp: , Rfl:    mirtazapine  (REMERON ) 7.5 MG tablet, Take 7.5 mg by mouth at bedtime., Disp: , Rfl:    Mouthwashes (BIOTENE/CALCIUM PBF) LIQD, Use as directed 15 mLs in the mouth or throat 2 (two) times daily., Disp: , Rfl:    pantoprazole  (PROTONIX ) 40 MG tablet, Take 1 tablet (40 mg total) by mouth in the morning., Disp: 30 tablet, Rfl: 0   phenylephrine -shark liver oil-mineral oil-petrolatum (PREPARATION H) 0.25-14-74.9 % rectal ointment, Place 1  Application rectally daily as needed for  hemorrhoids., Disp: , Rfl:    polyethylene glycol (MIRALAX  / GLYCOLAX ) packet, Take 17 g by mouth at bedtime., Disp: , Rfl:    pseudoephedrine (SUDAFED) 30 MG tablet, Take 30 mg by mouth every 6 (six) hours as needed for congestion., Disp: , Rfl:    triamcinolone  cream (KENALOG) 0.1 %, SMARTSIG:1 Application Topical 2-3 Times Daily, Disp: , Rfl:  [3]  Allergies Allergen Reactions   Pioglitazone Other (See Comments)    unknown  Unknown to patient     Van Knee, MD 05/28/24 1708

## 2024-05-25 NOTE — ED Triage Notes (Signed)
 Care taker that works with the patient states that some other coworkers noticed the patient has skin discoloration around her vaginal area and under her stomach about 3 days ago. Patient was on abx last week

## 2024-05-25 NOTE — Discharge Instructions (Addendum)
 Consider rinsing these areas with lukewarm water instead of using wipes that have preservatives in it that may irritate the skin even further. Dry off her skin with a dryer on low heat setting to maximize moisture removal between bathing. Then apply the miconazole  and Bactroban  ointment. Then apply a layer zinc paste over the ointment.  Apply these twice daily for 2 to 4 weeks.  I cannot prescribe fluconazole  because it interacts with her Seroquel .  Please follow-up with her primary care provider if she is not improving in several days.  Go to www.goodrx.com  or www.costplusdrugs.com to look up your medications. This will give you a list of where you can find your prescriptions at the most affordable prices. Or ask the pharmacist what the cash price is, or if they have any other discount programs available to help make your medication more affordable. This can be less expensive than what you would pay with insurance.    We can try ketoconazole 2%, Bactroban 

## 2024-06-28 ENCOUNTER — Ambulatory Visit: Admitting: Podiatry
